# Patient Record
Sex: Male | Born: 1955 | Race: White | Hispanic: Refuse to answer | Marital: Married | Smoking: Never smoker
Health system: Southern US, Community
[De-identification: ages and names within clinical notes are randomized; demographics above are authoritative.]

## PROBLEM LIST (undated history)

## (undated) DIAGNOSIS — E785 Hyperlipidemia, unspecified: Secondary | ICD-10-CM

## (undated) DIAGNOSIS — K5790 Diverticulosis of intestine, part unspecified, without perforation or abscess without bleeding: Secondary | ICD-10-CM

## (undated) DIAGNOSIS — F528 Other sexual dysfunction not due to a substance or known physiological condition: Secondary | ICD-10-CM

## (undated) DIAGNOSIS — N529 Male erectile dysfunction, unspecified: Secondary | ICD-10-CM

## (undated) DIAGNOSIS — E119 Type 2 diabetes mellitus without complications: Secondary | ICD-10-CM

## (undated) DIAGNOSIS — I10 Essential (primary) hypertension: Secondary | ICD-10-CM

## (undated) HISTORY — DX: Essential (primary) hypertension: I10

## (undated) HISTORY — PX: COLONOSCOPY: SHX174

## (undated) HISTORY — DX: Diverticulosis of intestine, part unspecified, without perforation or abscess without bleeding: K57.90

## (undated) HISTORY — DX: Type 2 diabetes mellitus without complications: E11.9

## (undated) HISTORY — DX: Other sexual dysfunction not due to a substance or known physiological condition: F52.8

## (undated) HISTORY — DX: Male erectile dysfunction, unspecified: N52.9

## (undated) HISTORY — DX: Hyperlipidemia, unspecified: E78.5

---
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
## 2003-07-27 ENCOUNTER — Emergency Department (HOSPITAL_COMMUNITY): Admission: EM | Admit: 2003-07-27 | Discharge: 2003-07-27 | Payer: Self-pay | Admitting: Emergency Medicine

## 2004-12-07 ENCOUNTER — Ambulatory Visit: Payer: Self-pay | Admitting: Internal Medicine

## 2009-02-11 ENCOUNTER — Ambulatory Visit: Payer: Self-pay | Admitting: Internal Medicine

## 2009-02-16 LAB — CONVERTED CEMR LAB
Alkaline Phosphatase: 116 units/L (ref 39–117)
Basophils Absolute: 0 10*3/uL (ref 0.0–0.1)
Bilirubin Urine: NEGATIVE
Bilirubin, Direct: 0.2 mg/dL (ref 0.0–0.3)
Calcium: 9.1 mg/dL (ref 8.4–10.5)
Cholesterol: 247 mg/dL (ref 0–200)
Direct LDL: 169.4 mg/dL
Eosinophils Absolute: 0.1 10*3/uL (ref 0.0–0.7)
GFR calc Af Amer: 131 mL/min
GFR calc non Af Amer: 108 mL/min
HCT: 42.9 % (ref 39.0–52.0)
HDL: 49 mg/dL (ref >39.0)
Hemoglobin, Urine: NEGATIVE
Ketones, ur: NEGATIVE mg/dL
MCHC: 35.6 g/dL (ref 30.0–36.0)
Monocytes Absolute: 0.5 10*3/uL (ref 0.1–1.0)
Monocytes Relative: 8.2 % (ref 3.0–12.0)
Neutro Abs: 3.6 10*3/uL (ref 1.4–7.7)
Nitrite: NEGATIVE
PSA: 0.47 ng/mL (ref 0.10–4.00)
Platelets: 173 10*3/uL (ref 150–400)
Potassium: 4.3 meq/L (ref 3.5–5.1)
RDW: 10.8 % — ABNORMAL LOW (ref 11.5–14.6)
Sodium: 139 meq/L (ref 135–145)
TSH: 1.02 microintl units/mL (ref 0.35–5.50)
Total Bilirubin: 1 mg/dL (ref 0.3–1.2)
Total CHOL/HDL Ratio: 5
Total Protein, Urine: NEGATIVE mg/dL
Triglycerides: 162 mg/dL — ABNORMAL HIGH (ref 0–149)

## 2009-02-18 ENCOUNTER — Ambulatory Visit: Payer: Self-pay | Admitting: Internal Medicine

## 2009-02-18 DIAGNOSIS — F528 Other sexual dysfunction not due to a substance or known physiological condition: Secondary | ICD-10-CM | POA: Insufficient documentation

## 2009-02-18 DIAGNOSIS — E119 Type 2 diabetes mellitus without complications: Secondary | ICD-10-CM

## 2009-02-18 DIAGNOSIS — I10 Essential (primary) hypertension: Secondary | ICD-10-CM

## 2009-02-18 DIAGNOSIS — E785 Hyperlipidemia, unspecified: Secondary | ICD-10-CM | POA: Insufficient documentation

## 2009-02-18 DIAGNOSIS — K921 Melena: Secondary | ICD-10-CM | POA: Insufficient documentation

## 2009-02-18 HISTORY — DX: Type 2 diabetes mellitus without complications: E11.9

## 2009-02-18 HISTORY — DX: Essential (primary) hypertension: I10

## 2009-02-18 HISTORY — DX: Other sexual dysfunction not due to a substance or known physiological condition: F52.8

## 2009-02-18 HISTORY — DX: Hyperlipidemia, unspecified: E78.5

## 2009-05-27 ENCOUNTER — Ambulatory Visit: Payer: Self-pay | Admitting: Internal Medicine

## 2009-06-10 ENCOUNTER — Ambulatory Visit: Payer: Self-pay | Admitting: Internal Medicine

## 2009-06-10 HISTORY — PX: COLONOSCOPY: SHX174

## 2011-04-04 LAB — GLUCOSE, CAPILLARY: Glucose-Capillary: 116 mg/dL — ABNORMAL HIGH (ref 70–99)

## 2012-08-31 ENCOUNTER — Encounter: Payer: Self-pay | Admitting: Internal Medicine

## 2012-08-31 DIAGNOSIS — Z0000 Encounter for general adult medical examination without abnormal findings: Secondary | ICD-10-CM | POA: Insufficient documentation

## 2012-08-31 DIAGNOSIS — K5790 Diverticulosis of intestine, part unspecified, without perforation or abscess without bleeding: Secondary | ICD-10-CM | POA: Insufficient documentation

## 2012-08-31 DIAGNOSIS — Z0001 Encounter for general adult medical examination with abnormal findings: Secondary | ICD-10-CM | POA: Insufficient documentation

## 2012-08-31 HISTORY — DX: Diverticulosis of intestine, part unspecified, without perforation or abscess without bleeding: K57.90

## 2012-09-07 ENCOUNTER — Ambulatory Visit (INDEPENDENT_AMBULATORY_CARE_PROVIDER_SITE_OTHER): Payer: Federal, State, Local not specified - PPO | Admitting: Internal Medicine

## 2012-09-07 ENCOUNTER — Encounter: Payer: Self-pay | Admitting: Internal Medicine

## 2012-09-07 VITALS — BP 180/110 | HR 101 | Temp 98.4°F | Ht 72.0 in | Wt 181.1 lb

## 2012-09-07 DIAGNOSIS — R0683 Snoring: Secondary | ICD-10-CM

## 2012-09-07 DIAGNOSIS — N529 Male erectile dysfunction, unspecified: Secondary | ICD-10-CM

## 2012-09-07 DIAGNOSIS — Z23 Encounter for immunization: Secondary | ICD-10-CM

## 2012-09-07 DIAGNOSIS — R0609 Other forms of dyspnea: Secondary | ICD-10-CM

## 2012-09-07 DIAGNOSIS — Z0000 Encounter for general adult medical examination without abnormal findings: Secondary | ICD-10-CM

## 2012-09-07 DIAGNOSIS — R9431 Abnormal electrocardiogram [ECG] [EKG]: Secondary | ICD-10-CM

## 2012-09-07 DIAGNOSIS — E119 Type 2 diabetes mellitus without complications: Secondary | ICD-10-CM

## 2012-09-07 DIAGNOSIS — I10 Essential (primary) hypertension: Secondary | ICD-10-CM

## 2012-09-07 HISTORY — DX: Male erectile dysfunction, unspecified: N52.9

## 2012-09-07 MED ORDER — AMLODIPINE-OLMESARTAN 5-40 MG PO TABS: 1.0000 | ORAL_TABLET | Freq: Every day | ORAL | Status: DC

## 2012-09-07 MED ORDER — TADALAFIL 20 MG PO TABS: 20.0000 mg | ORAL_TABLET | Freq: Every day | ORAL | Status: DC | PRN

## 2012-09-07 MED ORDER — LANCETS MISC: 1.0000 "application " | Freq: Every day | Status: AC

## 2012-09-07 MED ORDER — GLUCOSE BLOOD VI STRP: ORAL_STRIP | Status: AC

## 2012-09-07 NOTE — Assessment & Plan Note (Signed)

## 2012-09-07 NOTE — Patient Instructions (Addendum)
You had the flu shot today Please go to LAB in the Basement for the blood and/or urine tests to be done today, including the testosterone You will be contacted by phone if any changes need to be made immediately.  Otherwise, you will receive a letter about your results with an explanation. Take all new medications as prescribed - the azor 5/40 mg - 1 per day You will be contacted regarding the referral for: Diabetes Class, and ENT Please return in 6 mo with Lab testing done 3-5 days before

## 2012-09-07 NOTE — Progress Notes (Signed)
Subjective:    Patient ID: Ethan Boyd, male    DOB: 03-11-1956, 56 y.o.   MRN: [id number]  HPI  Here for wellness and f/u;  Overall doing ok;  Pt denies CP, worsening SOB, DOE, wheezing, orthopnea, PND, worsening LE edema, palpitations, dizziness or syncope.  Pt denies neurological change such as new Headache, facial or extremity weakness.  Pt denies polydipsia, polyuria, or low sugar symptoms. Pt states overall good compliance with treatment and medications, good tolerability, and trying to follow lower cholesterol diet.  Pt denies worsening depressive symptoms, suicidal ideation or panic. No fever, wt loss, night sweats, loss of appetite, or other constitutional symptoms.  Pt states good ability with ADL's, low fall risk, home safety reviewed and adequate, no significant changes in hearing or vision, and occasionally active with exercise.  Has freq snoring, ? Stopped breathing at night per wife (she isnt here), but has hx of broken nose, no hypersomnolence, asks for ENT evaluation.  Does have sense of ongoing fatigue, but denies signficant hypersomnolence, asks for testosterone level.  Wt is overall stable, no longer taking the actos as he had several yrs ago.   Has ongoing ED symptoms, ? Worse in the past 6 mo. Past Medical History  Diagnosis Date  . Diverticulosis 08/31/2012    Colonoscopy, June 10, 2009, Dr Perry/GI  . DIABETES MELLITUS, TYPE II 02/18/2009    Qualifier: Diagnosis of  By: Jonny Ruiz MD, Len Blalock   . HYPERLIPIDEMIA 02/18/2009    Qualifier: Diagnosis of  By: Jonny Ruiz MD, Len Blalock   . HYPERTENSION 02/18/2009    Qualifier: Diagnosis of  By: Jonny Ruiz MD, Len Blalock   . ERECTILE DYSFUNCTION 02/18/2009    Qualifier: Diagnosis of  By: Jonny Ruiz MD, Len Blalock   . Erectile dysfunction 09/07/2012   Past Surgical History  Procedure Date  . Right knee cartilage 1976    reports that he has never smoked. He has never used smokeless tobacco. He reports that he drinks alcohol. He reports that he does not use illicit  drugs. family history includes Diabetes in an unspecified family member. Allergies  Allergen Reactions  . Aspirin     REACTION: angioedema   Current Outpatient Prescriptions on File Prior to Visit  Medication Sig Dispense Refill  . amLODipine-olmesartan (AZOR) 5-40 MG per tablet Take 1 tablet by mouth daily.  90 tablet  3  . tadalafil (CIALIS) 20 MG tablet Take 1 tablet (20 mg total) by mouth daily as needed for erectile dysfunction.  10 tablet  11    Review of Systems Review of Systems  Constitutional: Negative for diaphoresis, activity change, appetite change and unexpected weight change.  HENT: Negative for hearing loss, ear pain, facial swelling, mouth sores and neck stiffness.   Eyes: Negative for pain, redness and visual disturbance.  Respiratory: Negative for shortness of breath and wheezing.   Cardiovascular: Negative for chest pain and palpitations.  Gastrointestinal: Negative for diarrhea, blood in stool, abdominal distention and rectal pain.  Genitourinary: Negative for hematuria, flank pain and decreased urine volume.  Musculoskeletal: Negative for myalgias and joint swelling.  Skin: Negative for color change and wound.  Neurological: Negative for syncope and numbness.  Hematological: Negative for adenopathy.  Psychiatric/Behavioral: Negative for hallucinations, self-injury, decreased concentration and agitation.      Objective:   Physical Exam BP 180/110  Pulse 101  Temp 98.4 F (36.9 C) (Oral)  Ht 6' (1.829 m)  Wt 181 lb 2 oz (82.158 kg)  BMI 24.57 kg/m2  SpO2  97% Physical Exam  VS noted Constitutional: Pt is oriented to person, place, and time. Appears well-developed and well-nourished.  HENT:  Head: Normocephalic and atraumatic.  Right Ear: External ear normal.  Left Ear: External ear normal.  Nose: Nose normal.  Mouth/Throat: Oropharynx is clear and moist.  Eyes: Conjunctivae and EOM are normal. Pupils are equal, round, and reactive to light.  Neck:  Normal range of motion. Neck supple. No JVD present. No tracheal deviation present.  Cardiovascular: Normal rate, regular rhythm, normal heart sounds and intact distal pulses.   Pulmonary/Chest: Effort normal and breath sounds normal.  Abdominal: Soft. Bowel sounds are normal. There is no tenderness.  Musculoskeletal: Normal range of motion. Exhibits no edema.  Lymphadenopathy:  Has no cervical adenopathy.  Neurological: Pt is alert and oriented to person, place, and time. Pt has normal reflexes. No cranial nerve deficit.  Skin: Skin is warm and dry. No rash noted.  Psychiatric:  Has  normal mood and affect. Behavior is normal.     Assessment & Plan:

## 2012-09-07 NOTE — Assessment & Plan Note (Signed)
Not c/w sleep apnea - for ENT for possible nasal septal problem

## 2012-09-07 NOTE — Assessment & Plan Note (Addendum)
C/w LVH likely due to HTN, d/w pt who is reluctant to take BP med

## 2012-09-08 ENCOUNTER — Encounter: Payer: Self-pay | Admitting: Internal Medicine

## 2012-09-08 NOTE — Assessment & Plan Note (Addendum)
Severe uncontrolled by hx and exam, most recent data reviewed with pt, and pt to continue medical treatment as before, ECG reviewed as per emr - sinus with LVH Lab Results  Component Value Date   WBC 5.7 02/11/2009   HGB 15.3 02/11/2009   HCT 42.9 02/11/2009   PLT 173 02/11/2009   GLUCOSE 250* 02/11/2009   CHOL 247* 02/11/2009   TRIG 162* 02/11/2009   HDL 49.0 02/11/2009   LDLDIRECT 169.4 02/11/2009   ALT 32 02/11/2009   AST 21 02/11/2009   NA 139 02/11/2009   K 4.3 02/11/2009   CL 98 02/11/2009   CREATININE 0.8 02/11/2009   BUN 17 02/11/2009   CO2 31 02/11/2009   TSH 1.02 02/11/2009   PSA 0.47 02/11/2009   HGBA1C 9.1* 02/11/2009   BP Readings from Last 3 Encounters:  09/07/12 180/110  02/18/09 196/110

## 2012-09-08 NOTE — Assessment & Plan Note (Signed)
Ok for testosterone check, and PDE5 prn

## 2012-09-08 NOTE — Assessment & Plan Note (Signed)
?   Control, for a1c, consider OHA for a1c > 7

## 2012-09-10 ENCOUNTER — Other Ambulatory Visit (INDEPENDENT_AMBULATORY_CARE_PROVIDER_SITE_OTHER): Payer: Federal, State, Local not specified - PPO

## 2012-09-10 DIAGNOSIS — E119 Type 2 diabetes mellitus without complications: Secondary | ICD-10-CM

## 2012-09-10 DIAGNOSIS — N529 Male erectile dysfunction, unspecified: Secondary | ICD-10-CM

## 2012-09-10 DIAGNOSIS — Z0000 Encounter for general adult medical examination without abnormal findings: Secondary | ICD-10-CM

## 2012-09-10 LAB — URINALYSIS, ROUTINE W REFLEX MICROSCOPIC
Bilirubin Urine: NEGATIVE
Nitrite: NEGATIVE
Total Protein, Urine: NEGATIVE
pH: 6 (ref 5.0–8.0)

## 2012-09-10 LAB — CBC WITH DIFFERENTIAL/PLATELET
Eosinophils Relative: 3 % (ref 0.0–5.0)
Lymphocytes Relative: 23.1 % (ref 12.0–46.0)
MCV: 93.5 fl (ref 78.0–100.0)
Monocytes Absolute: 0.7 10*3/uL (ref 0.1–1.0)
Monocytes Relative: 9.9 % (ref 3.0–12.0)
Neutrophils Relative %: 63.6 % (ref 43.0–77.0)
Platelets: 227 10*3/uL (ref 150.0–400.0)
WBC: 6.8 10*3/uL (ref 4.5–10.5)

## 2012-09-10 LAB — LIPID PANEL
Cholesterol: 203 mg/dL — ABNORMAL HIGH (ref 0–200)
HDL: 57.6 mg/dL (ref >39.00)
Triglycerides: 74 mg/dL (ref 0.0–149.0)
VLDL: 14.8 mg/dL (ref 0.0–40.0)

## 2012-09-10 LAB — HEPATIC FUNCTION PANEL
ALT: 52 U/L (ref 0–53)
Bilirubin, Direct: 0.2 mg/dL (ref 0.0–0.3)
Total Bilirubin: 0.9 mg/dL (ref 0.3–1.2)

## 2012-09-10 LAB — BASIC METABOLIC PANEL
BUN: 13 mg/dL (ref 6–23)
Calcium: 9.2 mg/dL (ref 8.4–10.5)
Creatinine, Ser: 0.9 mg/dL (ref 0.4–1.5)
GFR: 89.37 mL/min (ref >60.00)

## 2012-09-11 LAB — TESTOSTERONE, FREE, TOTAL, SHBG
Sex Hormone Binding: 32 nmol/L (ref 13–71)
Testosterone, Free: 56.8 pg/mL (ref 47.0–244.0)

## 2012-09-12 ENCOUNTER — Other Ambulatory Visit: Payer: Self-pay | Admitting: Internal Medicine

## 2012-09-12 ENCOUNTER — Encounter: Payer: Self-pay | Admitting: Internal Medicine

## 2012-09-12 MED ORDER — ATORVASTATIN CALCIUM 20 MG PO TABS: 20.0000 mg | ORAL_TABLET | Freq: Every day | ORAL | Status: DC

## 2012-09-12 MED ORDER — METFORMIN HCL 500 MG PO TABS: 500.0000 mg | ORAL_TABLET | Freq: Every day | ORAL | Status: DC

## 2013-03-11 ENCOUNTER — Ambulatory Visit: Payer: Federal, State, Local not specified - PPO | Admitting: Internal Medicine

## 2013-09-29 ENCOUNTER — Other Ambulatory Visit: Payer: Self-pay | Admitting: Internal Medicine

## 2013-12-17 ENCOUNTER — Telehealth: Payer: Self-pay | Admitting: *Deleted

## 2013-12-17 NOTE — Telephone Encounter (Signed)
rx not normally given prophylactically unless in the Nursing Home.  Ok to follow for now;  tamflu would be important in the first 48 hrs of onset of flu like symptoms though, so please let us know

## 2013-12-17 NOTE — Telephone Encounter (Signed)
Pt called states his mother in law was just diagnosed with the Flu approx. 2 hours ago.  Pt further states he has not had his Flu shot this year.  Pt is requesting a Rx of Tamiflu.  Please advise

## 2013-12-17 NOTE — Telephone Encounter (Signed)
Patient informed of MD instructions. 

## 2014-03-31 ENCOUNTER — Other Ambulatory Visit: Payer: Self-pay | Admitting: Internal Medicine

## 2014-05-09 ENCOUNTER — Encounter: Payer: Self-pay | Admitting: Internal Medicine

## 2014-05-09 ENCOUNTER — Ambulatory Visit (INDEPENDENT_AMBULATORY_CARE_PROVIDER_SITE_OTHER): Payer: Federal, State, Local not specified - PPO | Admitting: Internal Medicine

## 2014-05-09 VITALS — BP 140/84 | HR 105 | Temp 97.5°F | Ht 72.0 in | Wt 181.0 lb

## 2014-05-09 DIAGNOSIS — I10 Essential (primary) hypertension: Secondary | ICD-10-CM

## 2014-05-09 DIAGNOSIS — Z0000 Encounter for general adult medical examination without abnormal findings: Secondary | ICD-10-CM

## 2014-05-09 DIAGNOSIS — E119 Type 2 diabetes mellitus without complications: Secondary | ICD-10-CM

## 2014-05-09 MED ORDER — AMLODIPINE-OLMESARTAN 10-40 MG PO TABS: 1.0000 | ORAL_TABLET | Freq: Every day | ORAL | Status: DC

## 2014-05-09 NOTE — Assessment & Plan Note (Signed)
stable overall by history and exam, recent data reviewed with pt, and pt to continue medical treatment as before,  to f/u any worsening symptoms or concerns Lab Results  Component Value Date   HGBA1C 7.9* 09/10/2012   For f/u labs

## 2014-05-09 NOTE — Progress Notes (Signed)
Subjective:    Patient ID: Ethan Boyd, male    DOB: Apr 17, 1956, 58 y.o.   MRN: [id number]  HPI  Here for wellness and f/u;  Overall doing ok;  Pt denies CP, worsening SOB, DOE, wheezing, orthopnea, PND, worsening LE edema, palpitations, dizziness or syncope.  Pt denies neurological change such as new headache, facial or extremity weakness.  Pt denies polydipsia, polyuria, or low sugar symptoms. Pt states overall good compliance with treatment and medications, good tolerability, and has been trying to follow lower cholesterol diet.  Pt denies worsening depressive symptoms, suicidal ideation or panic. No fever, night sweats, wt loss, loss of appetite, or other constitutional symptoms.  Pt states good ability with ADL's, has low fall risk, home safety reviewed and adequate, no other significant changes in hearing or vision, and only occasionally active with exercise.  Curtrently on azor, Bp in the 130's usually. Past Medical History  Diagnosis Date  . Diverticulosis 08/31/2012    Colonoscopy, June 10, 2009, Dr Perry/GI  . DIABETES MELLITUS, TYPE II 02/18/2009    Qualifier: Diagnosis of  By: Jonny RuizJohn MD, Len BlalockJames W   . HYPERLIPIDEMIA 02/18/2009    Qualifier: Diagnosis of  By: Jonny RuizJohn MD, Len BlalockJames W   . HYPERTENSION 02/18/2009    Qualifier: Diagnosis of  By: Jonny RuizJohn MD, Len BlalockJames W   . ERECTILE DYSFUNCTION 02/18/2009    Qualifier: Diagnosis of  By: Jonny RuizJohn MD, Len BlalockJames W   . Erectile dysfunction 09/07/2012   Past Surgical History  Procedure Laterality Date  . Right knee cartilage  1976    reports that he has never smoked. He has never used smokeless tobacco. He reports that he drinks alcohol. He reports that he does not use illicit drugs. family history includes Diabetes in an other family member. Allergies  Allergen Reactions  . Aspirin     REACTION: angioedema   Current Outpatient Prescriptions on File Prior to Visit  Medication Sig Dispense Refill  . Lancets MISC 1 application by Does not apply route daily.  100  each  11  . tadalafil (CIALIS) 20 MG tablet Take 1 tablet (20 mg total) by mouth daily as needed for erectile dysfunction.  10 tablet  11   No current facility-administered medications on file prior to visit.   Review of Systems Constitutional: Negative for increased diaphoresis, other activity, appetite or other siginficant weight change  HENT: Negative for worsening hearing loss, ear pain, facial swelling, mouth sores and neck stiffness.   Eyes: Negative for other worsening pain, redness or visual disturbance.  Respiratory: Negative for shortness of breath and wheezing.   Cardiovascular: Negative for chest pain and palpitations.  Gastrointestinal: Negative for diarrhea, blood in stool, abdominal distention or other pain Genitourinary: Negative for hematuria, flank pain or change in urine volume.  Musculoskeletal: Negative for myalgias or other joint complaints.  Skin: Negative for color change and wound.  Neurological: Negative for syncope and numbness. other than noted Hematological: Negative for adenopathy. or other swelling Psychiatric/Behavioral: Negative for hallucinations, self-injury, decreased concentration or other worsening agitation.      Objective:   Physical Exam BP 140/84  Pulse 105  Temp(Src) 97.5 F (36.4 C) (Oral)  Ht 6' (1.829 m)  Wt 181 lb (82.101 kg)  BMI 24.54 kg/m2  SpO2 97% VS noted,  Constitutional: Pt is oriented to person, place, and time. Appears well-developed and well-nourished.  Head: Normocephalic and atraumatic.  Right Ear: External ear normal.  Left Ear: External ear normal.  Nose: Nose normal.  Mouth/Throat:  Oropharynx is clear and moist.  Eyes: Conjunctivae and EOM are normal. Pupils are equal, round, and reactive to light.  Neck: Normal range of motion. Neck supple. No JVD present. No tracheal deviation present.  Cardiovascular: Normal rate, regular rhythm, normal heart sounds and intact distal pulses.   Pulmonary/Chest: Effort normal and  breath sounds without rales or wheezing  Abdominal: Soft. Bowel sounds are normal. NT. No HSM  Musculoskeletal: Normal range of motion. Exhibits no edema.  Lymphadenopathy:  Has no cervical adenopathy.  Neurological: Pt is alert and oriented to person, place, and time. Pt has normal reflexes. No cranial nerve deficit. Motor grossly intact Skin: Skin is warm and dry. No rash noted.  Psychiatric:  Has normal mood and affect. Behavior is normal.      Assessment & Plan:

## 2014-05-09 NOTE — Patient Instructions (Addendum)
OK to increase the azor to the 10/40 mg  Per day  Please continue all other medications as before, and refills have been done if requested. Please have the pharmacy call with any other refills you may need.  Please continue your efforts at being more active, low cholesterol diet, and weight control.  You are otherwise up to date with prevention measures today.  Please keep your appointments with your specialists as you may have planned  Please go to the LAB in the Basement (turn left off the elevator) for the tests to be done today  You will be contacted by phone if any changes need to be made immediately.  Otherwise, you will receive a letter about your results with an explanation, but please check with MyChart first.  Please return in 6 months, or sooner if needed, with Lab testing done 3-5 days before\

## 2014-05-09 NOTE — Progress Notes (Signed)
Pre visit review using our clinic review tool, if applicable. No additional management support is needed unless otherwise documented below in the visit note. 

## 2014-05-09 NOTE — Assessment & Plan Note (Signed)

## 2014-05-09 NOTE — Assessment & Plan Note (Signed)
To incr the azor to 10/40

## 2014-05-12 ENCOUNTER — Telehealth: Payer: Self-pay | Admitting: Internal Medicine

## 2014-05-12 NOTE — Telephone Encounter (Signed)
Relevant patient education mailed to patient.  

## 2014-06-06 ENCOUNTER — Encounter: Payer: Self-pay | Admitting: Internal Medicine

## 2014-06-06 ENCOUNTER — Other Ambulatory Visit (INDEPENDENT_AMBULATORY_CARE_PROVIDER_SITE_OTHER): Payer: Federal, State, Local not specified - PPO

## 2014-06-06 ENCOUNTER — Other Ambulatory Visit: Payer: Self-pay | Admitting: Internal Medicine

## 2014-06-06 DIAGNOSIS — E119 Type 2 diabetes mellitus without complications: Secondary | ICD-10-CM

## 2014-06-06 DIAGNOSIS — Z0000 Encounter for general adult medical examination without abnormal findings: Secondary | ICD-10-CM

## 2014-06-06 LAB — CBC WITH DIFFERENTIAL/PLATELET
Basophils Absolute: 0 10*3/uL (ref 0.0–0.1)
Basophils Relative: 0.3 % (ref 0.0–3.0)
EOS ABS: 0.2 10*3/uL (ref 0.0–0.7)
Eosinophils Relative: 3.5 % (ref 0.0–5.0)
HCT: 41.5 % (ref 39.0–52.0)
HEMOGLOBIN: 14.3 g/dL (ref 13.0–17.0)
LYMPHS PCT: 27.4 % (ref 12.0–46.0)
Lymphs Abs: 1.8 10*3/uL (ref 0.7–4.0)
MCHC: 34.4 g/dL (ref 30.0–36.0)
MCV: 91.2 fl (ref 78.0–100.0)
MONOS PCT: 11 % (ref 3.0–12.0)
Monocytes Absolute: 0.7 10*3/uL (ref 0.1–1.0)
NEUTROS ABS: 3.9 10*3/uL (ref 1.4–7.7)
NEUTROS PCT: 57.8 % (ref 43.0–77.0)
Platelets: 223 10*3/uL (ref 150.0–400.0)
RBC: 4.55 Mil/uL (ref 4.22–5.81)
RDW: 12.2 % (ref 11.5–15.5)
WBC: 6.8 10*3/uL (ref 4.0–10.5)

## 2014-06-06 LAB — URINALYSIS, ROUTINE W REFLEX MICROSCOPIC
BILIRUBIN URINE: NEGATIVE
Hgb urine dipstick: NEGATIVE
Ketones, ur: NEGATIVE
LEUKOCYTES UA: NEGATIVE
NITRITE: NEGATIVE
Specific Gravity, Urine: 1.02 (ref 1.000–1.030)
TOTAL PROTEIN, URINE-UPE24: NEGATIVE
Urine Glucose: 100 — AB
Urobilinogen, UA: 0.2 (ref 0.0–1.0)
pH: 6 (ref 5.0–8.0)

## 2014-06-06 LAB — HEPATIC FUNCTION PANEL
ALBUMIN: 4.3 g/dL (ref 3.5–5.2)
ALT: 33 U/L (ref 0–53)
AST: 28 U/L (ref 0–37)
Alkaline Phosphatase: 82 U/L (ref 39–117)
Bilirubin, Direct: 0.1 mg/dL (ref 0.0–0.3)
TOTAL PROTEIN: 7.8 g/dL (ref 6.0–8.3)
Total Bilirubin: 0.7 mg/dL (ref 0.2–1.2)

## 2014-06-06 LAB — BASIC METABOLIC PANEL
BUN: 15 mg/dL (ref 6–23)
CHLORIDE: 101 meq/L (ref 96–112)
CO2: 27 meq/L (ref 19–32)
CREATININE: 0.9 mg/dL (ref 0.4–1.5)
Calcium: 9.3 mg/dL (ref 8.4–10.5)
GFR: 91.07 mL/min (ref >60.00)
GLUCOSE: 234 mg/dL — AB (ref 70–99)
Potassium: 4.3 mEq/L (ref 3.5–5.1)
Sodium: 138 mEq/L (ref 135–145)

## 2014-06-06 LAB — MICROALBUMIN / CREATININE URINE RATIO
Creatinine,U: 121.4 mg/dL
MICROALB/CREAT RATIO: 5.5 mg/g (ref 0.0–30.0)
Microalb, Ur: 6.7 mg/dL — ABNORMAL HIGH (ref 0.0–1.9)

## 2014-06-06 LAB — TSH: TSH: 1.47 u[IU]/mL (ref 0.35–4.50)

## 2014-06-06 LAB — LIPID PANEL
CHOLESTEROL: 233 mg/dL — AB (ref 0–200)
HDL: 71.9 mg/dL (ref >39.00)
LDL Cholesterol: 145 mg/dL — ABNORMAL HIGH (ref 0–99)
NonHDL: 161.1
TRIGLYCERIDES: 81 mg/dL (ref 0.0–149.0)
Total CHOL/HDL Ratio: 3
VLDL: 16.2 mg/dL (ref 0.0–40.0)

## 2014-06-06 LAB — PSA: PSA: 0.94 ng/mL (ref 0.10–4.00)

## 2014-06-06 LAB — HEMOGLOBIN A1C: Hgb A1c MFr Bld: 8.1 % — ABNORMAL HIGH (ref 4.6–6.5)

## 2014-06-06 MED ORDER — METFORMIN HCL ER 500 MG PO TB24: 500.0000 mg | ORAL_TABLET | Freq: Every day | ORAL | Status: DC

## 2014-06-06 MED ORDER — ATORVASTATIN CALCIUM 10 MG PO TABS: 10.0000 mg | ORAL_TABLET | Freq: Every day | ORAL | Status: DC

## 2014-06-26 ENCOUNTER — Telehealth: Payer: Self-pay | Admitting: *Deleted

## 2014-06-26 NOTE — Telephone Encounter (Signed)
Left message on machine for patient to call for a follow up DM visit. Labs already ordered.

## 2014-08-05 ENCOUNTER — Telehealth: Payer: Self-pay | Admitting: Internal Medicine

## 2014-08-05 NOTE — Telephone Encounter (Signed)
Left vm for patient to call back to schedule CPE.  Last CPE was 05/09/2014.

## 2015-05-15 ENCOUNTER — Other Ambulatory Visit: Payer: Self-pay | Admitting: Internal Medicine

## 2015-06-14 ENCOUNTER — Other Ambulatory Visit: Payer: Self-pay | Admitting: Internal Medicine

## 2015-07-13 ENCOUNTER — Other Ambulatory Visit: Payer: Self-pay | Admitting: Internal Medicine

## 2015-07-14 NOTE — Telephone Encounter (Signed)
In need of follow up appt. Thanks

## 2015-08-10 ENCOUNTER — Other Ambulatory Visit: Payer: Self-pay | Admitting: Internal Medicine

## 2015-11-12 ENCOUNTER — Encounter: Payer: Self-pay | Admitting: Internal Medicine

## 2016-02-05 ENCOUNTER — Telehealth: Payer: Self-pay | Admitting: *Deleted

## 2016-02-05 MED ORDER — AMLODIPINE-OLMESARTAN 10-40 MG PO TABS: 1.0000 | ORAL_TABLET | Freq: Every day | ORAL | Status: DC

## 2016-02-05 NOTE — Telephone Encounter (Signed)
Left msg on triage need refill on his Azor he has made appt for 03/16/16, but will be out of meds by then...Raechel Chute

## 2016-03-06 ENCOUNTER — Other Ambulatory Visit: Payer: Self-pay | Admitting: Internal Medicine

## 2016-03-16 ENCOUNTER — Telehealth: Payer: Self-pay

## 2016-03-16 ENCOUNTER — Encounter: Payer: Self-pay | Admitting: Internal Medicine

## 2016-03-16 ENCOUNTER — Ambulatory Visit (INDEPENDENT_AMBULATORY_CARE_PROVIDER_SITE_OTHER): Payer: Federal, State, Local not specified - PPO | Admitting: Internal Medicine

## 2016-03-16 VITALS — BP 140/82 | HR 113 | Temp 98.2°F | Resp 20 | Wt 180.0 lb

## 2016-03-16 DIAGNOSIS — I10 Essential (primary) hypertension: Secondary | ICD-10-CM | POA: Diagnosis not present

## 2016-03-16 DIAGNOSIS — Z0000 Encounter for general adult medical examination without abnormal findings: Secondary | ICD-10-CM | POA: Diagnosis not present

## 2016-03-16 DIAGNOSIS — E785 Hyperlipidemia, unspecified: Secondary | ICD-10-CM

## 2016-03-16 DIAGNOSIS — E119 Type 2 diabetes mellitus without complications: Secondary | ICD-10-CM

## 2016-03-16 DIAGNOSIS — Z1159 Encounter for screening for other viral diseases: Secondary | ICD-10-CM | POA: Diagnosis not present

## 2016-03-16 NOTE — Progress Notes (Signed)
Pre visit review using our clinic review tool, if applicable. No additional management support is needed unless otherwise documented below in the visit note. 

## 2016-03-16 NOTE — Assessment & Plan Note (Addendum)

## 2016-03-16 NOTE — Assessment & Plan Note (Addendum)
stable overall by history and exam, recent data reviewed with pt, and pt to continue medical treatment as before,  to f/u any worsening symptoms or concerns Lab Results  Component Value Date   LDLCALC 145* 06/06/2014   Is not taking the statin, for f/u lab , has been taking otc kale, but suspect will need statin if he accepts, goal ldl < 70

## 2016-03-16 NOTE — Assessment & Plan Note (Addendum)
Pt vague on taking the metformin daly, o/w stable overall by history and exam, recent data reviewed with pt, and pt to continue medical treatment as before,  to f/u any worsening symptoms or concerns Lab Results  Component Value Date   HGBA1C 8.1* 06/06/2014   For fu today, then repeat 6 mo

## 2016-03-16 NOTE — Telephone Encounter (Signed)
A user error has taken place.

## 2016-03-16 NOTE — Assessment & Plan Note (Signed)
stable overall by history and exam, recent data reviewed with pt, and pt to continue medical treatment as before,  to f/u any worsening symptoms or concerns BP Readings from Last 3 Encounters:  03/16/16 140/82  05/09/14 140/84  09/07/12 180/110

## 2016-03-16 NOTE — Patient Instructions (Signed)
Please continue all other medications as before, and refills have been done if requested.  Please have the pharmacy call with any other refills you may need.  Please continue your efforts at being more active, low cholesterol diet, and weight control.  You are otherwise up to date with prevention measures today.  Please keep your appointments with your specialists as you may have planned  Please make a Nurse Visit appointment if you change your mind about the Prevnar 13 shot, at any time  Please go to the LAB in the Basement (turn left off the elevator) for the tests to be done today  You will be contacted by phone if any changes need to be made immediately.  Otherwise, you will receive a letter about your results with an explanation, but please check with MyChart first.  Please remember to sign up for MyChart if you have not done so, as this will be important to you in the future with finding out test results, communicating by private email, and scheduling acute appointments online when needed.  Please return in 6 months, or sooner if needed, with Lab testing done 3-5 days before

## 2016-03-16 NOTE — Progress Notes (Signed)
Subjective:    Patient ID: Ethan Boyd, male    DOB: [birth year], 60 y.o.   MRN: [id number]  HPI Here for wellness and f/u;  Overall doing ok;  Pt denies Chest pain, worsening SOB, DOE, wheezing, orthopnea, PND, worsening LE edema, palpitations, dizziness or syncope.  Pt denies neurological change such as new headache, facial or extremity weakness.  Pt denies polydipsia, polyuria, or low sugar symptoms. Pt states overall good compliance with treatment and medications, good tolerability, and has been trying to follow appropriate diet.  Pt denies worsening depressive symptoms, suicidal ideation or panic. No fever, night sweats, wt loss, loss of appetite, or other constitutional symptoms.  Pt states good ability with ADL's, has low fall risk, home safety reviewed and adequate, no other significant changes in hearing or vision, and only occasionally active with exercise. Declines immunizations. BP < 14090 every few wks at grocery store  Past Medical History  Diagnosis Date  . Diverticulosis 08/31/2012    Colonoscopy, June 10, 2009, Dr Perry/GI  . DIABETES MELLITUS, TYPE II 02/18/2009    Qualifier: Diagnosis of  By: Jonny RuizJohn MD, Len BlalockJames W   . HYPERLIPIDEMIA 02/18/2009    Qualifier: Diagnosis of  By: Jonny RuizJohn MD, Len BlalockJames W   . HYPERTENSION 02/18/2009    Qualifier: Diagnosis of  By: Jonny RuizJohn MD, Len BlalockJames W   . ERECTILE DYSFUNCTION 02/18/2009    Qualifier: Diagnosis of  By: Jonny RuizJohn MD, Len BlalockJames W   . Erectile dysfunction 09/07/2012   Past Surgical History  Procedure Laterality Date  . Right knee cartilage  1976    reports that he has never smoked. He has never used smokeless tobacco. He reports that he drinks alcohol. He reports that he does not use illicit drugs. family history is not on file. Allergies  Allergen Reactions  . Aspirin     REACTION: angioedema   Current Outpatient Prescriptions on File Prior to Visit  Medication Sig Dispense Refill  . amLODipine-olmesartan (AZOR) 10-40 MG tablet TAKE 1 TABLET ONCE DAILY.  30 tablet 0  . atorvastatin (LIPITOR) 10 MG tablet Take 1 tablet (10 mg total) by mouth daily. 90 tablet 3  . metFORMIN (GLUCOPHAGE-XR) 500 MG 24 hr tablet Take 1 tablet (500 mg total) by mouth daily with breakfast. 90 tablet 3   No current facility-administered medications on file prior to visit.    Review of Systems Constitutional: Negative for increased diaphoresis, other activity, appetite or siginficant weight change other than noted HENT: Negative for worsening hearing loss, ear pain, facial swelling, mouth sores and neck stiffness.   Eyes: Negative for other worsening pain, redness or visual disturbance.  Respiratory: Negative for shortness of breath and wheezing  Cardiovascular: Negative for chest pain and palpitations.  Gastrointestinal: Negative for diarrhea, blood in stool, abdominal distention or other pain Genitourinary: Negative for hematuria, flank pain or change in urine volume.  Musculoskeletal: Negative for myalgias or other joint complaints.  Skin: Negative for color change and wound or drainage.  Neurological: Negative for syncope and numbness. other than noted Hematological: Negative for adenopathy. or other swelling Psychiatric/Behavioral: Negative for hallucinations, SI, self-injury, decreased concentration or other worsening agitation.      Objective:   Physical Exam BP 140/82 mmHg  Pulse 113  Temp(Src) 98.2 F (36.8 C) (Oral)  Resp 20  Wt 180 lb (81.647 kg)  SpO2 97% VS noted,  Constitutional: Pt is oriented to person, place, and time. Appears well-developed and well-nourished, in no significant distress Head: Normocephalic and atraumatic.  Right Ear:  External ear normal.  Left Ear: External ear normal.  Nose: Nose normal.  Mouth/Throat: Oropharynx is clear and moist.  Eyes: Conjunctivae and EOM are normal. Pupils are equal, round, and reactive to light.  Neck: Normal range of motion. Neck supple. No JVD present. No tracheal deviation present or  significant neck LA or mass Cardiovascular: Normal rate, regular rhythm, normal heart sounds and intact distal pulses.   Pulmonary/Chest: Effort normal and breath sounds without rales or wheezing  Abdominal: Soft. Bowel sounds are normal. NT. No HSM  Musculoskeletal: Normal range of motion. Exhibits no edema.  Lymphadenopathy:  Has no cervical adenopathy.  Neurological: Pt is alert and oriented to person, place, and time. Pt has normal reflexes. No cranial nerve deficit. Motor grossly intact Skin: Skin is warm and dry. No rash noted.  Psychiatric:  Has normal mood and affect. Behavior is normal.     Assessment & Plan:

## 2016-03-21 ENCOUNTER — Telehealth: Payer: Self-pay | Admitting: Internal Medicine

## 2016-03-21 ENCOUNTER — Other Ambulatory Visit (INDEPENDENT_AMBULATORY_CARE_PROVIDER_SITE_OTHER): Payer: Federal, State, Local not specified - PPO

## 2016-03-21 ENCOUNTER — Other Ambulatory Visit: Payer: Self-pay | Admitting: Internal Medicine

## 2016-03-21 ENCOUNTER — Encounter: Payer: Self-pay | Admitting: Internal Medicine

## 2016-03-21 DIAGNOSIS — Z0000 Encounter for general adult medical examination without abnormal findings: Secondary | ICD-10-CM

## 2016-03-21 DIAGNOSIS — E119 Type 2 diabetes mellitus without complications: Secondary | ICD-10-CM | POA: Diagnosis not present

## 2016-03-21 DIAGNOSIS — Z1159 Encounter for screening for other viral diseases: Secondary | ICD-10-CM

## 2016-03-21 LAB — URINALYSIS, ROUTINE W REFLEX MICROSCOPIC
Hgb urine dipstick: NEGATIVE
KETONES UR: 15 — AB
LEUKOCYTES UA: NEGATIVE
Nitrite: NEGATIVE
PH: 6 (ref 5.0–8.0)
RBC / HPF: NONE SEEN (ref 0–?)
Specific Gravity, Urine: 1.025 (ref 1.000–1.030)
TOTAL PROTEIN, URINE-UPE24: 30 — AB
UROBILINOGEN UA: 0.2 (ref 0.0–1.0)
Urine Glucose: NEGATIVE

## 2016-03-21 LAB — CBC WITH DIFFERENTIAL/PLATELET
BASOS PCT: 1 % (ref 0.0–3.0)
Basophils Absolute: 0.1 10*3/uL (ref 0.0–0.1)
EOS PCT: 1.8 % (ref 0.0–5.0)
Eosinophils Absolute: 0.1 10*3/uL (ref 0.0–0.7)
HCT: 43.5 % (ref 39.0–52.0)
Hemoglobin: 14.9 g/dL (ref 13.0–17.0)
LYMPHS ABS: 1.9 10*3/uL (ref 0.7–4.0)
Lymphocytes Relative: 28.5 % (ref 12.0–46.0)
MCHC: 34.3 g/dL (ref 30.0–36.0)
MCV: 90.4 fl (ref 78.0–100.0)
MONO ABS: 0.7 10*3/uL (ref 0.1–1.0)
MONOS PCT: 10.3 % (ref 3.0–12.0)
NEUTROS PCT: 58.4 % (ref 43.0–77.0)
Neutro Abs: 3.9 10*3/uL (ref 1.4–7.7)
Platelets: 231 10*3/uL (ref 150.0–400.0)
RBC: 4.81 Mil/uL (ref 4.22–5.81)
RDW: 12 % (ref 11.5–15.5)
WBC: 6.6 10*3/uL (ref 4.0–10.5)

## 2016-03-21 LAB — BASIC METABOLIC PANEL
BUN: 14 mg/dL (ref 6–23)
CO2: 29 mEq/L (ref 19–32)
Calcium: 9.7 mg/dL (ref 8.4–10.5)
Chloride: 98 mEq/L (ref 96–112)
Creatinine, Ser: 1.04 mg/dL (ref 0.40–1.50)
GFR: 77.58 mL/min (ref >60.00)
Glucose, Bld: 200 mg/dL — ABNORMAL HIGH (ref 70–99)
POTASSIUM: 4.8 meq/L (ref 3.5–5.1)
SODIUM: 137 meq/L (ref 135–145)

## 2016-03-21 LAB — MICROALBUMIN / CREATININE URINE RATIO
CREATININE, U: 274 mg/dL
MICROALB UR: 27.1 mg/dL — AB (ref 0.0–1.9)
Microalb Creat Ratio: 9.9 mg/g (ref 0.0–30.0)

## 2016-03-21 LAB — HEPATIC FUNCTION PANEL
ALK PHOS: 91 U/L (ref 39–117)
ALT: 35 U/L (ref 0–53)
AST: 27 U/L (ref 0–37)
Albumin: 4.6 g/dL (ref 3.5–5.2)
BILIRUBIN DIRECT: 0.1 mg/dL (ref 0.0–0.3)
BILIRUBIN TOTAL: 0.6 mg/dL (ref 0.2–1.2)
Total Protein: 7.5 g/dL (ref 6.0–8.3)

## 2016-03-21 LAB — LIPID PANEL
CHOL/HDL RATIO: 3
CHOLESTEROL: 245 mg/dL — AB (ref 0–200)
HDL: 72.7 mg/dL (ref >39.00)
LDL CALC: 147 mg/dL — AB (ref 0–99)
NONHDL: 171.81
Triglycerides: 125 mg/dL (ref 0.0–149.0)
VLDL: 25 mg/dL (ref 0.0–40.0)

## 2016-03-21 LAB — TSH: TSH: 1.36 u[IU]/mL (ref 0.35–4.50)

## 2016-03-21 LAB — HEMOGLOBIN A1C: Hgb A1c MFr Bld: 9.1 % — ABNORMAL HIGH (ref 4.6–6.5)

## 2016-03-21 LAB — HEPATITIS C ANTIBODY: HCV AB: NEGATIVE

## 2016-03-21 LAB — PSA: PSA: 0.88 ng/mL (ref 0.10–4.00)

## 2016-03-21 MED ORDER — METFORMIN HCL ER 500 MG PO TB24: ORAL_TABLET | ORAL | Status: DC

## 2016-03-21 MED ORDER — ROSUVASTATIN CALCIUM 20 MG PO TABS: 20.0000 mg | ORAL_TABLET | Freq: Every day | ORAL | Status: DC

## 2016-03-21 MED ORDER — GLIPIZIDE ER 5 MG PO TB24: 5.0000 mg | ORAL_TABLET | Freq: Every day | ORAL | Status: DC

## 2016-03-21 NOTE — Telephone Encounter (Signed)
Pt returned your call. Please call him at [phone]

## 2016-04-08 ENCOUNTER — Other Ambulatory Visit: Payer: Self-pay | Admitting: Internal Medicine

## 2016-05-09 ENCOUNTER — Other Ambulatory Visit: Payer: Self-pay | Admitting: Internal Medicine

## 2016-06-10 ENCOUNTER — Other Ambulatory Visit: Payer: Self-pay | Admitting: Internal Medicine

## 2016-07-08 ENCOUNTER — Other Ambulatory Visit: Payer: Self-pay | Admitting: Internal Medicine

## 2016-08-07 ENCOUNTER — Other Ambulatory Visit: Payer: Self-pay | Admitting: Internal Medicine

## 2016-10-01 DIAGNOSIS — Z23 Encounter for immunization: Secondary | ICD-10-CM | POA: Diagnosis not present

## 2017-02-05 ENCOUNTER — Other Ambulatory Visit: Payer: Self-pay | Admitting: Internal Medicine

## 2017-02-21 ENCOUNTER — Other Ambulatory Visit: Payer: Self-pay | Admitting: Internal Medicine

## 2017-04-09 ENCOUNTER — Other Ambulatory Visit: Payer: Self-pay | Admitting: Internal Medicine

## 2017-06-10 ENCOUNTER — Telehealth: Payer: Self-pay | Admitting: Internal Medicine

## 2017-06-20 MED ORDER — AMLODIPINE-OLMESARTAN 10-40 MG PO TABS
1.0000 | ORAL_TABLET | Freq: Every day | ORAL | 1 refills | Status: DC
Start: 2017-06-20 — End: 2017-08-15

## 2017-06-20 NOTE — Telephone Encounter (Signed)
Patient has an appointment set up for August 8th. Dr. Jonny RuizJohn did not have a CPE spot any sooner that worked for the patient. Can we send in refills until his appointment. Thank you.   [facility] is what he uses.

## 2017-06-20 NOTE — Addendum Note (Signed)
Addended by: Roney MansGAY, SHIRRON on: 06/20/2017 11:07 AM   Modules accepted: Orders

## 2017-06-20 NOTE — Telephone Encounter (Signed)
Done

## 2017-08-02 ENCOUNTER — Encounter: Payer: Federal, State, Local not specified - PPO | Admitting: Internal Medicine

## 2017-08-12 ENCOUNTER — Other Ambulatory Visit: Payer: Self-pay | Admitting: Internal Medicine

## 2017-08-15 ENCOUNTER — Telehealth: Payer: Self-pay | Admitting: Internal Medicine

## 2017-08-15 MED ORDER — ROSUVASTATIN CALCIUM 20 MG PO TABS: 20.0000 mg | ORAL_TABLET | Freq: Every day | ORAL | 1 refills | Status: DC

## 2017-08-15 MED ORDER — GLIPIZIDE ER 5 MG PO TB24: 5.0000 mg | ORAL_TABLET | Freq: Every day | ORAL | 1 refills | Status: DC

## 2017-08-15 MED ORDER — METFORMIN HCL ER 500 MG PO TB24: ORAL_TABLET | ORAL | 1 refills | Status: DC

## 2017-08-15 MED ORDER — AMLODIPINE-OLMESARTAN 10-40 MG PO TABS: 1.0000 | ORAL_TABLET | Freq: Every day | ORAL | 1 refills | Status: DC

## 2017-08-15 NOTE — Telephone Encounter (Signed)
Pt called stating that he does have an appointment schedule on October 10th. Can this be refilled?

## 2017-08-15 NOTE — Telephone Encounter (Signed)
Per office policy sent 30 day to local pharmacy until appt.../lmb  

## 2017-08-15 NOTE — Addendum Note (Signed)
Addended by: Deatra James on: 08/15/2017 04:21 PM   Modules accepted: Orders

## 2017-10-04 ENCOUNTER — Encounter: Payer: Federal, State, Local not specified - PPO | Admitting: Internal Medicine

## 2017-10-30 ENCOUNTER — Telehealth: Payer: Self-pay | Admitting: Internal Medicine

## 2017-10-30 MED ORDER — AMLODIPINE-OLMESARTAN 10-40 MG PO TABS: 1.0000 | ORAL_TABLET | Freq: Every day | ORAL | 1 refills | Status: DC

## 2017-10-30 NOTE — Telephone Encounter (Signed)
amLODipine-olmesartan (AZOR) 10-40 MG tablet   Patient is requesting a refill on this medication. He has a CPE set up for 12/20.

## 2017-10-30 NOTE — Telephone Encounter (Signed)
Per office policy sent enough refills to local pharmacy until appt.../lmb  

## 2017-11-02 ENCOUNTER — Encounter: Payer: Federal, State, Local not specified - PPO | Admitting: Internal Medicine

## 2017-12-14 ENCOUNTER — Encounter: Payer: Federal, State, Local not specified - PPO | Admitting: Internal Medicine

## 2017-12-29 ENCOUNTER — Other Ambulatory Visit: Payer: Self-pay

## 2017-12-29 ENCOUNTER — Telehealth: Payer: Self-pay | Admitting: Internal Medicine

## 2017-12-29 MED ORDER — AMLODIPINE-OLMESARTAN 10-40 MG PO TABS: 1.0000 | ORAL_TABLET | Freq: Every day | ORAL | 0 refills | Status: DC

## 2017-12-29 NOTE — Telephone Encounter (Signed)
Copied from CRM [id number]. Topic: Quick Communication - See Telephone Encounter >> Dec 29, 2017  2:26 PM Everardo PacificMoton, [name], [place] wrote: CRM for notification. See Telephone encounter for: Patient calling because he needs a refill on his Amlodipine. Patient stated that he has been in contact with the pharmacy as well and he does have an appoinment to be seen in the office on 01-25-2018. If someone could give him a call back at [phone]  12/29/17.

## 2017-12-29 NOTE — Progress Notes (Signed)
Prescription refill amlodipine-olmesartan 10-40mg  tab per protocol, OV appointment 01/25/18.

## 2017-12-29 NOTE — Telephone Encounter (Signed)
Called patient at [phone] and left VM that amlodipine-olmesartan was refilled and it at pharmacy.

## 2018-01-25 ENCOUNTER — Encounter: Payer: Federal, State, Local not specified - PPO | Admitting: Internal Medicine

## 2018-02-05 ENCOUNTER — Telehealth: Payer: Self-pay | Admitting: Internal Medicine

## 2018-02-05 ENCOUNTER — Other Ambulatory Visit: Payer: Self-pay | Admitting: Internal Medicine

## 2018-02-05 MED ORDER — AMLODIPINE-OLMESARTAN 10-40 MG PO TABS: 1.0000 | ORAL_TABLET | Freq: Every day | ORAL | 0 refills | Status: DC

## 2018-02-05 NOTE — Telephone Encounter (Signed)
Amlodipine-Olmesartan refill request  (Azor) Last 2 OV cancelled by pt  12/14/17 and 01/25/18.   Last month's refill based on her coming in on 01/25/18.  [facility]t. [facility] [address]  [place], [place]

## 2018-02-05 NOTE — Telephone Encounter (Signed)
Per office policy sent 30 day to local pharmacy until appt.../lmb  

## 2018-02-05 NOTE — Telephone Encounter (Signed)
Patient has an appointment for when his CPE is due in March.

## 2018-02-05 NOTE — Addendum Note (Signed)
Addended by: Deatra JamesBRAND, Serafin [place] M on: 02/05/2018 03:33 PM   Modules accepted: Orders

## 2018-02-05 NOTE — Telephone Encounter (Signed)
Copied from CRM [phone]. Topic: Quick Communication - Rx Refill/Question >> Feb 05, 2018 11:52 AM Rudi CocoLathan, Anquinette Pierro M, NT wrote: Medication:  Amlodipine-olmesartan (azor)  Has the patient contacted their pharmacy? yes   (Agent: If no, request that the patient contact the pharmacy for the refill.)   Preferred Pharmacy (with phone number or street name): [facility] [place] [place] [address] Phone: [phone] Fax: [phone]     Agent: Please be advised that RX refills may take up to 3 business days. We ask that you follow-up with your pharmacy.

## 2018-03-08 ENCOUNTER — Other Ambulatory Visit: Payer: Self-pay | Admitting: Internal Medicine

## 2018-03-08 NOTE — Telephone Encounter (Signed)
Copied from CRM [id number]. Topic: Quick Communication - See Telephone Encounter >> Mar 08, 2018  1:32 PM Waymon AmatoBurton, Donna F wrote: CRM for notification. See Telephone encounter for:  Pt is needing a 30day supply of his refill for his [facility]   Best number [phone]  03/08/18.

## 2018-03-08 NOTE — Telephone Encounter (Signed)
Pt must keep March appt for further refills.

## 2018-03-09 MED ORDER — AMLODIPINE-OLMESARTAN 10-40 MG PO TABS: 1.0000 | ORAL_TABLET | Freq: Every day | ORAL | 0 refills | Status: DC

## 2018-03-09 NOTE — Telephone Encounter (Signed)
Per office policy sent 30 day to local pharmacy until appt.../lmb  

## 2018-03-09 NOTE — Telephone Encounter (Signed)
[facility] refill request  Per notes must keep March appt on 03/23/18 with Dr. Jonny RuizJohn for future refills.    [facility]  [place]  [id number]

## 2018-03-23 ENCOUNTER — Encounter: Payer: Self-pay | Admitting: Internal Medicine

## 2018-03-23 ENCOUNTER — Other Ambulatory Visit: Payer: Self-pay | Admitting: Internal Medicine

## 2018-03-23 ENCOUNTER — Other Ambulatory Visit (INDEPENDENT_AMBULATORY_CARE_PROVIDER_SITE_OTHER): Payer: Federal, State, Local not specified - PPO

## 2018-03-23 ENCOUNTER — Telehealth: Payer: Self-pay

## 2018-03-23 ENCOUNTER — Ambulatory Visit (INDEPENDENT_AMBULATORY_CARE_PROVIDER_SITE_OTHER): Payer: Federal, State, Local not specified - PPO | Admitting: Internal Medicine

## 2018-03-23 VITALS — BP 126/84 | HR 110 | Temp 98.1°F | Ht 72.0 in | Wt 181.0 lb

## 2018-03-23 DIAGNOSIS — Z0000 Encounter for general adult medical examination without abnormal findings: Secondary | ICD-10-CM

## 2018-03-23 DIAGNOSIS — E785 Hyperlipidemia, unspecified: Secondary | ICD-10-CM | POA: Diagnosis not present

## 2018-03-23 DIAGNOSIS — Z114 Encounter for screening for human immunodeficiency virus [HIV]: Secondary | ICD-10-CM | POA: Diagnosis not present

## 2018-03-23 DIAGNOSIS — E119 Type 2 diabetes mellitus without complications: Secondary | ICD-10-CM

## 2018-03-23 DIAGNOSIS — I10 Essential (primary) hypertension: Secondary | ICD-10-CM | POA: Diagnosis not present

## 2018-03-23 LAB — BASIC METABOLIC PANEL
BUN: 15 mg/dL (ref 6–23)
CALCIUM: 9.4 mg/dL (ref 8.4–10.5)
CO2: 30 meq/L (ref 19–32)
CREATININE: 0.99 mg/dL (ref 0.40–1.50)
Chloride: 97 mEq/L (ref 96–112)
GFR: 81.57 mL/min (ref >60.00)
Glucose, Bld: 281 mg/dL — ABNORMAL HIGH (ref 70–99)
Potassium: 4.8 mEq/L (ref 3.5–5.1)
Sodium: 136 mEq/L (ref 135–145)

## 2018-03-23 LAB — URINALYSIS, ROUTINE W REFLEX MICROSCOPIC
Bilirubin Urine: NEGATIVE
Hgb urine dipstick: NEGATIVE
Ketones, ur: NEGATIVE
LEUKOCYTES UA: NEGATIVE
Nitrite: NEGATIVE
RBC / HPF: NONE SEEN (ref 0–?)
SPECIFIC GRAVITY, URINE: 1.02 (ref 1.000–1.030)
TOTAL PROTEIN, URINE-UPE24: NEGATIVE
Urobilinogen, UA: 0.2 (ref 0.0–1.0)
pH: 6.5 (ref 5.0–8.0)

## 2018-03-23 LAB — LIPID PANEL
CHOL/HDL RATIO: 3
CHOLESTEROL: 224 mg/dL — AB (ref 0–200)
HDL: 72.4 mg/dL (ref >39.00)
LDL Cholesterol: 140 mg/dL — ABNORMAL HIGH (ref 0–99)
NonHDL: 152
Triglycerides: 59 mg/dL (ref 0.0–149.0)
VLDL: 11.8 mg/dL (ref 0.0–40.0)

## 2018-03-23 LAB — CBC WITH DIFFERENTIAL/PLATELET
BASOS ABS: 0 10*3/uL (ref 0.0–0.1)
Basophils Relative: 0.6 % (ref 0.0–3.0)
EOS ABS: 0.1 10*3/uL (ref 0.0–0.7)
Eosinophils Relative: 1.3 % (ref 0.0–5.0)
HEMATOCRIT: 43.8 % (ref 39.0–52.0)
HEMOGLOBIN: 15.2 g/dL (ref 13.0–17.0)
LYMPHS PCT: 24 % (ref 12.0–46.0)
Lymphs Abs: 1.5 10*3/uL (ref 0.7–4.0)
MCHC: 34.8 g/dL (ref 30.0–36.0)
MCV: 92.7 fl (ref 78.0–100.0)
MONO ABS: 0.8 10*3/uL (ref 0.1–1.0)
Monocytes Relative: 12.4 % — ABNORMAL HIGH (ref 3.0–12.0)
Neutro Abs: 3.9 10*3/uL (ref 1.4–7.7)
Neutrophils Relative %: 61.7 % (ref 43.0–77.0)
Platelets: 241 10*3/uL (ref 150.0–400.0)
RBC: 4.72 Mil/uL (ref 4.22–5.81)
RDW: 12.3 % (ref 11.5–15.5)
WBC: 6.4 10*3/uL (ref 4.0–10.5)

## 2018-03-23 LAB — PSA: PSA: 0.94 ng/mL (ref 0.10–4.00)

## 2018-03-23 LAB — HEPATIC FUNCTION PANEL
ALBUMIN: 4.2 g/dL (ref 3.5–5.2)
ALT: 30 U/L (ref 0–53)
AST: 21 U/L (ref 0–37)
Alkaline Phosphatase: 90 U/L (ref 39–117)
BILIRUBIN TOTAL: 0.6 mg/dL (ref 0.2–1.2)
Bilirubin, Direct: 0.1 mg/dL (ref 0.0–0.3)
Total Protein: 7.3 g/dL (ref 6.0–8.3)

## 2018-03-23 LAB — MICROALBUMIN / CREATININE URINE RATIO
Creatinine,U: 95.5 mg/dL
MICROALB/CREAT RATIO: 10.1 mg/g (ref 0.0–30.0)
Microalb, Ur: 9.6 mg/dL — ABNORMAL HIGH (ref 0.0–1.9)

## 2018-03-23 LAB — TSH: TSH: 1.19 u[IU]/mL (ref 0.35–4.50)

## 2018-03-23 LAB — HEMOGLOBIN A1C: Hgb A1c MFr Bld: 8.7 % — ABNORMAL HIGH (ref 4.6–6.5)

## 2018-03-23 MED ORDER — EMPAGLIFLOZIN 25 MG PO TABS: 25.0000 mg | ORAL_TABLET | Freq: Every day | ORAL | 3 refills | Status: DC

## 2018-03-23 MED ORDER — METFORMIN HCL ER 500 MG PO TB24: ORAL_TABLET | ORAL | 3 refills | Status: DC

## 2018-03-23 MED ORDER — AMLODIPINE-OLMESARTAN 10-40 MG PO TABS: 1.0000 | ORAL_TABLET | Freq: Every day | ORAL | 11 refills | Status: DC

## 2018-03-23 MED ORDER — AMLODIPINE-OLMESARTAN 10-40 MG PO TABS: 1.0000 | ORAL_TABLET | Freq: Every day | ORAL | 3 refills | Status: DC

## 2018-03-23 NOTE — Progress Notes (Signed)
Subjective:    Patient ID: Ethan Boyd, male    DOB: 1956-10-11, 62 y.o.   MRN: [id number]  HPI  Here for wellness and f/u;  Overall doing ok;  Pt denies Chest pain, worsening SOB, DOE, wheezing, orthopnea, PND, worsening LE edema, palpitations, dizziness or syncope.  Pt denies neurological change such as new headache, facial or extremity weakness.  Pt denies polydipsia, polyuria, or low sugar symptoms. Pt states overall good compliance with treatment and medications, good tolerability, and has been trying to follow appropriate diet.  Pt denies worsening depressive symptoms, suicidal ideation or panic. No fever, night sweats, wt loss, loss of appetite, or other constitutional symptoms.  Pt states good ability with ADL's, has low fall risk, home safety reviewed and adequate, no other significant changes in hearing or vision, and only occasionally active with exercise  Declines immunizations.  Refuses statin ever  No other interval hx or new complaints Past Medical History:  Diagnosis Date  . DIABETES MELLITUS, TYPE II 02/18/2009   Qualifier: Diagnosis of  By: Jonny RuizJohn MD, Len BlalockJames W   . Diverticulosis 08/31/2012   Colonoscopy, June 10, 2009, Dr Perry/GI  . ERECTILE DYSFUNCTION 02/18/2009   Qualifier: Diagnosis of  By: Jonny RuizJohn MD, Len BlalockJames W   . Erectile dysfunction 09/07/2012  . HYPERLIPIDEMIA 02/18/2009   Qualifier: Diagnosis of  By: Jonny RuizJohn MD, Len BlalockJames W   . HYPERTENSION 02/18/2009   Qualifier: Diagnosis of  By: Jonny RuizJohn MD, Len BlalockJames W    Past Surgical History:  Procedure Laterality Date  . right knee cartilage  1976    reports that he has never smoked. He has never used smokeless tobacco. He reports that he drinks alcohol. He reports that he does not use drugs. family history includes Diabetes in his unknown relative. Allergies  Allergen Reactions  . Aspirin     REACTION: angioedema   No current outpatient medications on file prior to visit.   No current facility-administered medications on file prior to  visit.    Review of Systems Constitutional: Negative for other unusual diaphoresis, sweats, appetite or weight changes HENT: Negative for other worsening hearing loss, ear pain, facial swelling, mouth sores or neck stiffness.   Eyes: Negative for other worsening pain, redness or other visual disturbance.  Respiratory: Negative for other stridor or swelling Cardiovascular: Negative for other palpitations or other chest pain  Gastrointestinal: Negative for worsening diarrhea or loose stools, blood in stool, distention or other pain Genitourinary: Negative for hematuria, flank pain or other change in urine volume.  Musculoskeletal: Negative for myalgias or other joint swelling.  Skin: Negative for other color change, or other wound or worsening drainage.  Neurological: Negative for other syncope or numbness. Hematological: Negative for other adenopathy or swelling Psychiatric/Behavioral: Negative for hallucinations, other worsening agitation, SI, self-injury, or new decreased concentration All other system neg per pt    Objective:   Physical Exam BP 126/84   Pulse (!) 110   Temp 98.1 F (36.7 C) (Oral)   Ht 6' (1.829 Boyd)   Wt 181 lb (82.1 kg)   SpO2 98%   BMI 24.55 kg/Boyd  VS noted,  Constitutional: Pt is oriented to person, place, and time. Appears well-developed and well-nourished, in no significant distress and comfortable Head: Normocephalic and atraumatic  Eyes: Conjunctivae and EOM are normal. Pupils are equal, round, and reactive to light Right Ear: External ear normal without discharge Left Ear: External ear normal without discharge Nose: Nose without discharge or deformity Mouth/Throat: Oropharynx is without other ulcerations  and moist  Neck: Normal range of motion. Neck supple. No JVD present. No tracheal deviation present or significant neck LA or mass Cardiovascular: Normal rate, regular rhythm, normal heart sounds and intact distal pulses.   Pulmonary/Chest: WOB normal  and breath sounds without rales or wheezing  Abdominal: Soft. Bowel sounds are normal. NT. No HSM  Musculoskeletal: Normal range of motion. Exhibits no edema Lymphadenopathy: Has no other cervical adenopathy.  Neurological: Pt is alert and oriented to person, place, and time. Pt has normal reflexes. No cranial nerve deficit. Motor grossly intact, Gait intact Skin: Skin is warm and dry. No rash noted or new ulcerations Psychiatric:  Has normal mood and affect. Behavior is normal without agitation No other exam findings     Assessment & Plan:

## 2018-03-23 NOTE — Telephone Encounter (Signed)
-----   Message from Corwin LevinsJames W John, MD sent at 03/23/2018 12:46 PM EDT ----- Letter sent, cont same tx except  The test results show that your current treatment is OK, except very mild protein was seen again in the urine, the LDL cholesterol is moderately high but stable, and the A1c is moderately high.  Please continue the blood pressure medication to help with the protein and let us know if you change your mind about starting the statin for cholesterol, with a goal being to be less than 70.    We need to add another medication to your metformin, called Jardiance 25 mg per day.  This type of medication has been shown to reduce your future heart disease risk, so this is preferred over the glipizide you were previously prescribed.  Please let us know if the [place] PepperJardiance is not covered well with your insurance, and ask the pharmacist which medication in the same family would be covered.  Priyanka Causey to please inform pt, I will do rx
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
## 2018-03-23 NOTE — Patient Instructions (Addendum)
Please make sure to have your yearly eye exam with ophthalmology  Please continue all other medications as before, and refills have been done if requested.  Please have the pharmacy call with any other refills you may need.  Please continue your efforts at being more active, low cholesterol diet, and weight control.  You are otherwise up to date with prevention measures today.  Please keep your appointments with your specialists as you may have planned  Please go to the LAB in the Basement (turn left off the elevator) for the tests to be done today  You will be contacted by phone if any changes need to be made immediately.  Otherwise, you will receive a letter about your results with an explanation, but please check with MyChart first.  Please remember to sign up for MyChart if you have not done so, as this will be important to you in the future with finding out test results, communicating by private email, and scheduling acute appointments online when needed.  Please return in 6 months, or sooner if needed, with Lab testing done 3-5 days before

## 2018-03-25 ENCOUNTER — Encounter: Payer: Self-pay | Admitting: Internal Medicine

## 2018-03-25 NOTE — Assessment & Plan Note (Signed)

## 2018-03-25 NOTE — Assessment & Plan Note (Signed)
stable overall by history and exam, recent data reviewed with pt, and pt to continue medical treatment as before,  to f/u any worsening symptoms or concerns, for f/u lab today 

## 2018-03-25 NOTE — Assessment & Plan Note (Signed)
BP Readings from Last 3 Encounters:  03/23/18 126/84  03/16/16 140/82  05/09/14 140/84  stable overall by history and exam, recent data reviewed with pt, and pt to continue medical treatment as before,  to f/u any worsening symptoms or concerns

## 2018-03-25 NOTE — Assessment & Plan Note (Signed)
stable overall by history and exam, recent data reviewed with pt, and pt to continue medical treatment as before,  to f/u any worsening symptoms or concerns, for f/u lipids, goal ldl < 70

## 2018-10-17 ENCOUNTER — Emergency Department (HOSPITAL_COMMUNITY)
Admission: EM | Admit: 2018-10-17 | Discharge: 2018-10-17 | Disposition: A | Discharge status: Home / Self Care | Payer: Worker's Compensation | Attending: Emergency Medicine | Admitting: Emergency Medicine

## 2018-10-17 ENCOUNTER — Emergency Department (HOSPITAL_COMMUNITY): Payer: Worker's Compensation

## 2018-10-17 ENCOUNTER — Encounter (HOSPITAL_COMMUNITY): Payer: Self-pay

## 2018-10-17 DIAGNOSIS — S43102A Unspecified dislocation of left acromioclavicular joint, initial encounter: Secondary | ICD-10-CM | POA: Diagnosis not present

## 2018-10-17 DIAGNOSIS — Z7984 Long term (current) use of oral hypoglycemic drugs: Secondary | ICD-10-CM | POA: Insufficient documentation

## 2018-10-17 DIAGNOSIS — I10 Essential (primary) hypertension: Secondary | ICD-10-CM | POA: Diagnosis not present

## 2018-10-17 DIAGNOSIS — Y999 Unspecified external cause status: Secondary | ICD-10-CM | POA: Insufficient documentation

## 2018-10-17 DIAGNOSIS — Z79899 Other long term (current) drug therapy: Secondary | ICD-10-CM | POA: Diagnosis not present

## 2018-10-17 DIAGNOSIS — Y92410 Unspecified street and highway as the place of occurrence of the external cause: Secondary | ICD-10-CM | POA: Insufficient documentation

## 2018-10-17 DIAGNOSIS — M25512 Pain in left shoulder: Secondary | ICD-10-CM | POA: Diagnosis not present

## 2018-10-17 DIAGNOSIS — S4992XA Unspecified injury of left shoulder and upper arm, initial encounter: Secondary | ICD-10-CM | POA: Diagnosis not present

## 2018-10-17 DIAGNOSIS — Y9389 Activity, other specified: Secondary | ICD-10-CM | POA: Insufficient documentation

## 2018-10-17 DIAGNOSIS — E119 Type 2 diabetes mellitus without complications: Secondary | ICD-10-CM | POA: Insufficient documentation

## 2018-10-17 MED ORDER — METHOCARBAMOL 500 MG PO TABS: 500.0000 mg | ORAL_TABLET | Freq: Two times a day (BID) | ORAL | 0 refills | Status: DC

## 2018-10-17 MED ORDER — HYDROCODONE-ACETAMINOPHEN 5-325 MG PO TABS: 1.0000 | ORAL_TABLET | Freq: Four times a day (QID) | ORAL | 0 refills | Status: DC | PRN
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
## 2018-10-17 NOTE — Discharge Instructions (Signed)
You can take Tylenol or Ibuprofen as directed for pain. You can alternate Tylenol and Ibuprofen every 4 hours. If you take Tylenol at 1pm, then you can take Ibuprofen at 5pm. Then you can take Tylenol again at 9pm.   You can take the pain medication for severe breakthrough pain.  Do not drive while taking them as will make you sleepy.  Take Robaxin as prescribed. This medication will make you drowsy so do not drive or drink alcohol when taking it.  Wear the sling for support and stabilization.  Apply ice to help with swelling.  As we discussed, follow-up with orthopedics as directed.  Return the emergency department for any worsening pain, numbness/weakness of the arm or hand or any other worsening or concerning symptoms.

## 2018-11-16 DIAGNOSIS — M7542 Impingement syndrome of left shoulder: Secondary | ICD-10-CM | POA: Diagnosis not present

## 2018-11-16 DIAGNOSIS — M25512 Pain in left shoulder: Secondary | ICD-10-CM | POA: Diagnosis not present

## 2019-04-15 ENCOUNTER — Other Ambulatory Visit: Payer: Self-pay | Admitting: Internal Medicine

## 2019-05-22 ENCOUNTER — Telehealth: Payer: Self-pay | Admitting: Internal Medicine

## 2019-05-22 NOTE — Telephone Encounter (Signed)
Pt called back to request refill, made aware that appointment would be necessary before refill request could be approved. He is adamantly against being scheduled for a visit. Made aware of virtual visits to avoid exposure, pt still refused and insisted that refill be processed without scheduling a visit. Please advise Best Contact: [phone]

## 2019-05-23 MED ORDER — METFORMIN HCL ER 500 MG PO TB24: ORAL_TABLET | ORAL | 0 refills | Status: DC

## 2019-05-23 NOTE — Telephone Encounter (Signed)
Patient has scheduled in office appt for 7/21.

## 2019-05-23 NOTE — Telephone Encounter (Signed)
Refill has been sent to hold him over until OV

## 2019-05-23 NOTE — Telephone Encounter (Signed)
Denied.  Pt needs to schedule a virtual if he doesn't want to come in office.

## 2019-05-23 NOTE — Addendum Note (Signed)
Addended by: Roney Mans on: 05/23/2019 09:26 AM   Modules accepted: Orders

## 2019-06-03 ENCOUNTER — Other Ambulatory Visit: Payer: Self-pay | Admitting: Internal Medicine

## 2019-06-11 ENCOUNTER — Encounter: Payer: Self-pay | Admitting: Internal Medicine

## 2019-07-01 ENCOUNTER — Other Ambulatory Visit: Payer: Self-pay | Admitting: Internal Medicine

## 2019-07-16 ENCOUNTER — Encounter: Payer: Federal, State, Local not specified - PPO | Admitting: Internal Medicine

## 2019-08-02 ENCOUNTER — Other Ambulatory Visit: Payer: Self-pay | Admitting: Internal Medicine

## 2019-08-27 ENCOUNTER — Other Ambulatory Visit: Payer: Self-pay | Admitting: Internal Medicine

## 2019-09-16 ENCOUNTER — Encounter: Payer: Federal, State, Local not specified - PPO | Admitting: Internal Medicine

## 2019-10-31 ENCOUNTER — Other Ambulatory Visit: Payer: Self-pay

## 2019-10-31 DIAGNOSIS — Z20822 Contact with and (suspected) exposure to covid-19: Secondary | ICD-10-CM

## 2019-11-01 ENCOUNTER — Telehealth: Payer: Self-pay | Admitting: Internal Medicine

## 2019-11-01 ENCOUNTER — Ambulatory Visit (INDEPENDENT_AMBULATORY_CARE_PROVIDER_SITE_OTHER): Payer: Federal, State, Local not specified - PPO | Admitting: Internal Medicine

## 2019-11-01 DIAGNOSIS — R05 Cough: Secondary | ICD-10-CM

## 2019-11-01 DIAGNOSIS — R059 Cough, unspecified: Secondary | ICD-10-CM

## 2019-11-01 LAB — NOVEL CORONAVIRUS, NAA: SARS-CoV-2, NAA: NOT DETECTED

## 2019-11-01 MED ORDER — HYDROCODONE-HOMATROPINE 5-1.5 MG/5ML PO SYRP: 5.0000 mL | ORAL_SOLUTION | Freq: Four times a day (QID) | ORAL | 0 refills | Status: AC | PRN

## 2019-11-01 MED ORDER — AZITHROMYCIN 250 MG PO TABS: ORAL_TABLET | ORAL | 1 refills | Status: DC

## 2019-11-01 NOTE — Telephone Encounter (Signed)
Copied from [place] [id number]. Topic: General - Other >> Nov 01, 2019 10:16 AM Keene Breath wrote: Reason for CRM: Pharmacy called to get the diagnosis code for a script for Z-Pak.  Please call to discuss at [phone]

## 2019-11-01 NOTE — Progress Notes (Signed)
Patient ID: Ethan Boyd, male   DOB: May 25, 1956, 63 y.o.   MRN: [id number]  Phone visit  Cumulative time during 7-day interval 11 min, there was not an associated office visit for this concern within a 7 day period.  Verbal consent for services obtained from patient prior to services given.  Names of all persons present for services: Cathlean Cower, MD, patient  Chief complaint: cough  History, background, results pertinent:  Here with c/o cough now x 3 days after wife coworker tested COVID + and now wife COVID + and doing relatively well with loss of smell only and congestion.  Pt himself has COVID testing pending but has had non prod cough with fever to 102 and coughing so much he cannot sleep, with marked fatigue.  Pt denies chest pain, increased sob or doe, wheezing, orthopnea, PND, increased LE swelling, palpitations, dizziness or syncope. No chills, loss of taste or smell.  Pt denies new neurological symptoms such as new headache, or facial or extremity weakness or numbness   Pt denies polydipsia, polyuria   Past Medical History:  Diagnosis Date  . DIABETES MELLITUS, TYPE II 02/18/2009   Qualifier: Diagnosis of  By: Jenny Reichmann MD, Hunt Oris   . Diverticulosis 08/31/2012   Colonoscopy, June 10, 2009, Dr Perry/GI  . ERECTILE DYSFUNCTION 02/18/2009   Qualifier: Diagnosis of  By: Jenny Reichmann MD, Hunt Oris   . Erectile dysfunction 09/07/2012  . HYPERLIPIDEMIA 02/18/2009   Qualifier: Diagnosis of  By: Jenny Reichmann MD, Hunt Oris   . HYPERTENSION 02/18/2009   Qualifier: Diagnosis of  By: Jenny Reichmann MD, Hunt Oris    No results found for this or any previous visit (from the past 48 hour(s)).  Lab Results  Component Value Date   WBC 6.4 03/23/2018   HGB 15.2 03/23/2018   HCT 43.8 03/23/2018   PLT 241.0 03/23/2018   GLUCOSE 281 (H) 03/23/2018   CHOL 224 (H) 03/23/2018   TRIG 59.0 03/23/2018   HDL 72.40 03/23/2018   LDLDIRECT 136.4 09/10/2012   LDLCALC 140 (H) 03/23/2018   ALT 30 03/23/2018   AST 21 03/23/2018   NA 136  03/23/2018   K 4.8 03/23/2018   CL 97 03/23/2018   CREATININE 0.99 03/23/2018   BUN 15 03/23/2018   CO2 30 03/23/2018   TSH 1.19 03/23/2018   PSA 0.94 03/23/2018   HGBA1C 8.7 (H) 03/23/2018   MICROALBUR 9.6 (H) 03/23/2018    A/P/next steps:   Cough - likely COVID related, testing pending but cant r/o other, for zpack if covid neg, and cough med prn,  to f/u any worsening symptoms or concerns  Cathlean Cower MD

## 2019-11-01 NOTE — Telephone Encounter (Signed)
Dx has been given

## 2019-11-02 ENCOUNTER — Encounter: Payer: Self-pay | Admitting: Internal Medicine

## 2019-11-02 DIAGNOSIS — R05 Cough: Secondary | ICD-10-CM | POA: Insufficient documentation

## 2019-11-02 DIAGNOSIS — R059 Cough, unspecified: Secondary | ICD-10-CM | POA: Insufficient documentation

## 2019-11-02 NOTE — Patient Instructions (Signed)
pelase follow up on your COVID testing results  Please take all new medication as prescribed - the cough medicine, and zpack if covid neg  Please continue all other medications as before, and refills have been done if requested.  Please have the pharmacy call with any other refills you may need.  Please continue your efforts at being more active, low cholesterol diet, and weight control.  Please keep your appointments with your specialists as you may have planned

## 2019-11-02 NOTE — Assessment & Plan Note (Signed)
See notes

## 2019-11-04 ENCOUNTER — Telehealth: Payer: Self-pay | Admitting: General Practice

## 2019-11-04 ENCOUNTER — Telehealth: Payer: Self-pay

## 2019-11-04 NOTE — Telephone Encounter (Signed)
Negative COVID results given. Patient results "NOT Detected." Caller expressed understanding. ° °

## 2019-11-04 NOTE — Telephone Encounter (Signed)
Ok to cont cough med  As there can be false neg COVID testing, please go to UC or ED for any shortness of breath to r/o other such as pna (we are not allowed to have pt come to office for this)

## 2019-11-04 NOTE — Telephone Encounter (Signed)
Copied from [place] [id number]. Topic: General - Other >> Nov 04, 2019 10:45 AM Celene Kras A wrote: Reason for CRM: Pt called stating his wife was exposed to 2 people who tested positive for covid. Pt states his wife has also tested positive. Pt states he is experiencing symptoms but that he tested negative. Pt is concerned. Please advise.

## 2019-11-04 NOTE — Telephone Encounter (Signed)
Pt has been informed and expressed understanding.  

## 2019-11-04 NOTE — Telephone Encounter (Signed)
Pt wife is calling and the patient has a cough. Pt has tested neg for covid 19 . Pt is exhausted  Also.  [facility] [place]

## 2019-11-06 ENCOUNTER — Encounter: Payer: Federal, State, Local not specified - PPO | Admitting: Internal Medicine

## 2019-11-08 ENCOUNTER — Other Ambulatory Visit: Payer: Self-pay | Admitting: Internal Medicine

## 2019-11-08 ENCOUNTER — Telehealth: Payer: Self-pay | Admitting: Internal Medicine

## 2019-11-08 NOTE — Telephone Encounter (Signed)
azithromycin (ZITHROMAX Z-PAK) 250 MG tablet    Patient is requesting refill. He states pharmacy needs approval for a second refill.     [place], [place]

## 2019-11-11 NOTE — Telephone Encounter (Signed)
I would not recommend further zpack if not helped so far, should be ok with symptomatic med like cough med prn if no fever or sob or cp; may need to consider treatment for allergies as well

## 2019-11-11 NOTE — Telephone Encounter (Signed)
Per [employer] pt picked up refill of zpak on 11/10/19 and paid cash.

## 2019-11-29 ENCOUNTER — Other Ambulatory Visit: Payer: Self-pay | Admitting: Internal Medicine

## 2019-12-02 ENCOUNTER — Encounter: Payer: Federal, State, Local not specified - PPO | Admitting: Internal Medicine

## 2019-12-09 ENCOUNTER — Other Ambulatory Visit: Payer: Self-pay

## 2019-12-09 DIAGNOSIS — Z20822 Contact with and (suspected) exposure to covid-19: Secondary | ICD-10-CM

## 2019-12-10 LAB — NOVEL CORONAVIRUS, NAA: SARS-CoV-2, NAA: NOT DETECTED

## 2020-01-16 ENCOUNTER — Ambulatory Visit (INDEPENDENT_AMBULATORY_CARE_PROVIDER_SITE_OTHER): Payer: Federal, State, Local not specified - PPO | Admitting: Internal Medicine

## 2020-01-16 ENCOUNTER — Other Ambulatory Visit: Payer: Self-pay

## 2020-01-16 ENCOUNTER — Encounter: Payer: Self-pay | Admitting: Internal Medicine

## 2020-01-16 VITALS — BP 162/84 | HR 112 | Temp 98.8°F | Ht 72.0 in | Wt 180.0 lb

## 2020-01-16 DIAGNOSIS — E611 Iron deficiency: Secondary | ICD-10-CM

## 2020-01-16 DIAGNOSIS — E538 Deficiency of other specified B group vitamins: Secondary | ICD-10-CM | POA: Diagnosis not present

## 2020-01-16 DIAGNOSIS — I10 Essential (primary) hypertension: Secondary | ICD-10-CM

## 2020-01-16 DIAGNOSIS — E559 Vitamin D deficiency, unspecified: Secondary | ICD-10-CM

## 2020-01-16 DIAGNOSIS — Z0000 Encounter for general adult medical examination without abnormal findings: Secondary | ICD-10-CM | POA: Diagnosis not present

## 2020-01-16 DIAGNOSIS — Z20822 Contact with and (suspected) exposure to covid-19: Secondary | ICD-10-CM

## 2020-01-16 DIAGNOSIS — E119 Type 2 diabetes mellitus without complications: Secondary | ICD-10-CM

## 2020-01-16 DIAGNOSIS — Z23 Encounter for immunization: Secondary | ICD-10-CM

## 2020-01-16 DIAGNOSIS — E785 Hyperlipidemia, unspecified: Secondary | ICD-10-CM

## 2020-01-16 LAB — LIPID PANEL
Cholesterol: 254 mg/dL — ABNORMAL HIGH (ref 0–200)
HDL: 80.7 mg/dL (ref >39.00)
LDL Cholesterol: 162 mg/dL — ABNORMAL HIGH (ref 0–99)
NonHDL: 173.03
Total CHOL/HDL Ratio: 3
Triglycerides: 54 mg/dL (ref 0.0–149.0)
VLDL: 10.8 mg/dL (ref 0.0–40.0)

## 2020-01-16 LAB — URINALYSIS, ROUTINE W REFLEX MICROSCOPIC
Bilirubin Urine: NEGATIVE
Hgb urine dipstick: NEGATIVE
Leukocytes,Ua: NEGATIVE
Nitrite: NEGATIVE
RBC / HPF: NONE SEEN (ref 0–?)
Specific Gravity, Urine: 1.01 (ref 1.000–1.030)
Total Protein, Urine: NEGATIVE
Urine Glucose: >=1000 — AB
Urobilinogen, UA: 0.2 (ref 0.0–1.0)
pH: 6 (ref 5.0–8.0)

## 2020-01-16 LAB — CBC WITH DIFFERENTIAL/PLATELET
Basophils Absolute: 0.1 10*3/uL (ref 0.0–0.1)
Basophils Relative: 0.9 % (ref 0.0–3.0)
Eosinophils Absolute: 0.2 10*3/uL (ref 0.0–0.7)
Eosinophils Relative: 2.8 % (ref 0.0–5.0)
HCT: 45.7 % (ref 39.0–52.0)
Hemoglobin: 15.7 g/dL (ref 13.0–17.0)
Lymphocytes Relative: 23.1 % (ref 12.0–46.0)
Lymphs Abs: 1.4 10*3/uL (ref 0.7–4.0)
MCHC: 34.3 g/dL (ref 30.0–36.0)
MCV: 93.5 fl (ref 78.0–100.0)
Monocytes Absolute: 0.9 10*3/uL (ref 0.1–1.0)
Monocytes Relative: 14.4 % — ABNORMAL HIGH (ref 3.0–12.0)
Neutro Abs: 3.7 10*3/uL (ref 1.4–7.7)
Neutrophils Relative %: 58.8 % (ref 43.0–77.0)
Platelets: 225 10*3/uL (ref 150.0–400.0)
RBC: 4.89 Mil/uL (ref 4.22–5.81)
RDW: 12.3 % (ref 11.5–15.5)
WBC: 6.2 10*3/uL (ref 4.0–10.5)

## 2020-01-16 LAB — BASIC METABOLIC PANEL
BUN: 18 mg/dL (ref 6–23)
CO2: 31 mEq/L (ref 19–32)
Calcium: 9.7 mg/dL (ref 8.4–10.5)
Chloride: 94 mEq/L — ABNORMAL LOW (ref 96–112)
Creatinine, Ser: 1.08 mg/dL (ref 0.40–1.50)
GFR: 69.01 mL/min (ref >60.00)
Glucose, Bld: 291 mg/dL — ABNORMAL HIGH (ref 70–99)
Potassium: 5 mEq/L (ref 3.5–5.1)
Sodium: 132 mEq/L — ABNORMAL LOW (ref 135–145)

## 2020-01-16 LAB — IBC PANEL
Iron: 139 ug/dL (ref 42–165)
Saturation Ratios: 39.4 % (ref 20.0–50.0)
Transferrin: 252 mg/dL (ref 212.0–360.0)

## 2020-01-16 LAB — VITAMIN D 25 HYDROXY (VIT D DEFICIENCY, FRACTURES): VITD: 40.75 ng/mL (ref 30.00–100.00)

## 2020-01-16 LAB — HEPATIC FUNCTION PANEL
ALT: 20 U/L (ref 0–53)
AST: 17 U/L (ref 0–37)
Albumin: 4.3 g/dL (ref 3.5–5.2)
Alkaline Phosphatase: 105 U/L (ref 39–117)
Bilirubin, Direct: 0.1 mg/dL (ref 0.0–0.3)
Total Bilirubin: 0.5 mg/dL (ref 0.2–1.2)
Total Protein: 7.3 g/dL (ref 6.0–8.3)

## 2020-01-16 LAB — MICROALBUMIN / CREATININE URINE RATIO
Creatinine,U: 57 mg/dL
Microalb Creat Ratio: 16.5 mg/g (ref 0.0–30.0)
Microalb, Ur: 9.4 mg/dL — ABNORMAL HIGH (ref 0.0–1.9)

## 2020-01-16 LAB — VITAMIN B12: Vitamin B-12: 299 pg/mL (ref 211–911)

## 2020-01-16 LAB — TSH: TSH: 1.56 u[IU]/mL (ref 0.35–4.50)

## 2020-01-16 LAB — HEMOGLOBIN A1C: Hgb A1c MFr Bld: 9.8 % — ABNORMAL HIGH (ref 4.6–6.5)

## 2020-01-16 LAB — SARS-COV-2 IGG: SARS-COV-2 IgG: 26.63

## 2020-01-16 MED ORDER — ROSUVASTATIN CALCIUM 20 MG PO TABS: 20.0000 mg | ORAL_TABLET | Freq: Every day | ORAL | 3 refills | Status: DC

## 2020-01-16 NOTE — Progress Notes (Signed)
Subjective:    Patient ID: Ethan Boyd, male    DOB: 06/28/1956, 64 y.o.   MRN: [id number]  HPI  Here for wellness and f/u;  Overall doing ok;  Pt denies Chest pain, worsening SOB, DOE, wheezing, orthopnea, PND, worsening LE edema, palpitations, dizziness or syncope.  Pt denies neurological change such as new headache, facial or extremity weakness.  Pt denies polydipsia, polyuria, or low sugar symptoms. Pt states overall good compliance with treatment and medications, good tolerability, and has been trying to follow appropriate diet.  Pt denies worsening depressive symptoms, suicidal ideation or panic. No fever, night sweats, wt loss, loss of appetite, or other constitutional symptoms.  Pt states good ability with ADL's, has low fall risk, home safety reviewed and adequate, no other significant changes in hearing or vision, and only occasionally active with exercise. Had URI a few months ago, wondering now if might have been COVID infection  BP < 140/90 at home.  Willing to start statin  No new complaints Past Medical History:  Diagnosis Date  . DIABETES MELLITUS, TYPE II 02/18/2009   Qualifier: Diagnosis of  By: Jenny Reichmann MD, Hunt Oris   . Diverticulosis 08/31/2012   Colonoscopy, June 10, 2009, Dr Perry/GI  . ERECTILE DYSFUNCTION 02/18/2009   Qualifier: Diagnosis of  By: Jenny Reichmann MD, Hunt Oris   . Erectile dysfunction 09/07/2012  . HYPERLIPIDEMIA 02/18/2009   Qualifier: Diagnosis of  By: Jenny Reichmann MD, Hunt Oris   . HYPERTENSION 02/18/2009   Qualifier: Diagnosis of  By: Jenny Reichmann MD, Hunt Oris    Past Surgical History:  Procedure Laterality Date  . right knee cartilage  1976    reports that he has never smoked. He has never used smokeless tobacco. He reports current alcohol use. He reports that he does not use drugs. family history includes Diabetes in an other family member. Allergies  Allergen Reactions  . Aspirin     REACTION: angioedema   Current Outpatient Medications on File Prior to Visit  Medication Sig  Dispense Refill  . acetaminophen (TYLENOL) 500 MG tablet Take 1,000 mg by mouth every 4 (four) hours as needed for moderate pain.    Marland Kitchen amLODipine-olmesartan (AZOR) 10-40 MG tablet TAKE 1 TABLET ONCE DAILY. 30 tablet 2  . metFORMIN (GLUCOPHAGE-XR) 500 MG 24 hr tablet TAKE 2 TABLETS EACH MORNING. 180 tablet 0  . Multiple Vitamin (MULTIVITAMIN WITH MINERALS) TABS tablet Take 1 tablet by mouth daily.    . Omega-3 Fatty Acids (FISH OIL) 1200 MG CAPS Take by mouth.    . empagliflozin (JARDIANCE) 25 MG TABS tablet Take 25 mg by mouth daily. (Patient not taking: Reported on 10/17/2018) 90 tablet 3   No current facility-administered medications on file prior to visit.   Review of Systems All otherwise neg per pt     Objective:   Physical Exam BP (!) 162/84   Pulse (!) 112   Temp 98.8 F (37.1 C)   Ht 6' (1.829 m)   Wt 180 lb (81.6 kg)   SpO2 99%   BMI 24.41 kg/m  VS noted,  Constitutional: Pt appears in NAD HENT: Head: NCAT.  Right Ear: External ear normal.  Left Ear: External ear normal.  Eyes: . Pupils are equal, round, and reactive to light. Conjunctivae and EOM are normal Nose: without d/c or deformity Neck: Neck supple. Gross normal ROM Cardiovascular: Normal rate and regular rhythm.   Pulmonary/Chest: Effort normal and breath sounds without rales or wheezing.  Abd:  Soft, NT, ND, +  BS, no organomegaly Neurological: Pt is alert. At baseline orientation, motor grossly intact Skin: Skin is warm. No rashes, other new lesions, no LE edema Psychiatric: Pt behavior is normal without agitation  Lab Results  Component Value Date   WBC 6.2 01/16/2020   HGB 15.7 01/16/2020   HCT 45.7 01/16/2020   PLT 225.0 01/16/2020   GLUCOSE 291 (H) 01/16/2020   CHOL 254 (H) 01/16/2020   TRIG 54.0 01/16/2020   HDL 80.70 01/16/2020   LDLDIRECT 136.4 09/10/2012   LDLCALC 162 (H) 01/16/2020   ALT 20 01/16/2020   AST 17 01/16/2020   NA 132 (L) 01/16/2020   K 5.0 01/16/2020   CL 94 (L)  01/16/2020   CREATININE 1.08 01/16/2020   BUN 18 01/16/2020   CO2 31 01/16/2020   TSH 1.56 01/16/2020   PSA 0.94 03/23/2018   HGBA1C 9.8 (H) 01/16/2020   MICROALBUR 9.4 (H) 01/16/2020      Assessment & Plan:

## 2020-01-16 NOTE — Patient Instructions (Addendum)
You had the Tdap tetanus shot today  Please remember to make your appts with eye doctor and colonoscopy with Dr Marina Goodell  Please take all new medication as prescribed - the crestor 20 mg per day for cholesterol  Please check your Blood Pressure on a regular basis at home with the goal to be at least < 140/90  Please continue all other medications as before, and refills have been done if requested.  Please have the pharmacy call with any other refills you may need.  Please continue your efforts at being more active, low cholesterol diet, and weight control.  You are otherwise up to date with prevention measures today.  Please keep your appointments with your specialists as you may have planned  Please go to the LAB at the blood drawing area for the tests to be done  You will be contacted by phone if any changes need to be made immediately.  Otherwise, you will receive a letter about your results with an explanation, but please check with MyChart first.  Please remember to sign up for MyChart if you have not done so, as this will be important to you in the future with finding out test results, communicating by private email, and scheduling acute appointments online when needed.  Please make an Appointment to return in 6 months, or sooner if needed

## 2020-01-17 ENCOUNTER — Other Ambulatory Visit: Payer: Self-pay | Admitting: Internal Medicine

## 2020-01-17 MED ORDER — PIOGLITAZONE HCL 30 MG PO TABS: 30.0000 mg | ORAL_TABLET | Freq: Every day | ORAL | 3 refills | Status: DC

## 2020-01-19 ENCOUNTER — Encounter: Payer: Self-pay | Admitting: Internal Medicine

## 2020-01-19 NOTE — Assessment & Plan Note (Signed)
To start crestor 20 mg qd

## 2020-01-19 NOTE — Assessment & Plan Note (Signed)
stable overall by history and exam, recent data reviewed with pt, and pt to continue medical treatment as before,  to f/u any worsening symptoms or concerns  

## 2020-01-19 NOTE — Assessment & Plan Note (Signed)

## 2020-01-19 NOTE — Assessment & Plan Note (Signed)
For SAR 2 IgG

## 2020-01-21 ENCOUNTER — Encounter: Payer: Self-pay | Admitting: Internal Medicine

## 2020-01-21 DIAGNOSIS — Z0000 Encounter for general adult medical examination without abnormal findings: Secondary | ICD-10-CM

## 2020-01-21 NOTE — Telephone Encounter (Signed)
For some reason this was not done  Please ask pt to make a first floor lab appt for just the pSA to be done at his convenience

## 2020-02-14 ENCOUNTER — Other Ambulatory Visit: Payer: Self-pay | Admitting: Internal Medicine

## 2020-02-14 NOTE — Telephone Encounter (Signed)
Please refill as per office routine med refill policy (all routine meds refilled for 3 mo or monthly per pt preference up to one year from last visit, then month to month grace period for 3 mo, then further med refills will have to be denied)  

## 2020-02-29 ENCOUNTER — Other Ambulatory Visit: Payer: Self-pay | Admitting: Internal Medicine

## 2020-02-29 NOTE — Telephone Encounter (Signed)
Please refill as per office routine med refill policy (all routine meds refilled for 3 mo or monthly per pt preference up to one year from last visit, then month to month grace period for 3 mo, then further med refills will have to be denied)  

## 2020-03-30 ENCOUNTER — Ambulatory Visit: Payer: Federal, State, Local not specified - PPO | Attending: Internal Medicine

## 2020-03-30 DIAGNOSIS — Z23 Encounter for immunization: Secondary | ICD-10-CM

## 2020-03-30 NOTE — Progress Notes (Signed)
   Covid-19 Vaccination Clinic  Name:  Ethan Boyd    MRN: [id number] DOB: 1956-05-27  03/30/2020  [name] was observed post Covid-19 immunization for 15 minutes without incident. He was provided with Vaccine Information Sheet and instruction to access the V-Safe system.   [name] was instructed to call 911 with any severe reactions post vaccine: Marland Kitchen Difficulty breathing  . Swelling of face and throat  . A fast heartbeat  . A bad rash all over body  . Dizziness and weakness   Immunizations Administered    Name Date Dose VIS Date Route   Pfizer COVID-19 Vaccine 03/30/2020  1:18 PM 0.3 mL 12/06/2019 Intramuscular   Manufacturer: [employer], [employer]   Lot: [id number]   NDC: [address]-7

## 2020-04-21 ENCOUNTER — Ambulatory Visit: Payer: Federal, State, Local not specified - PPO | Attending: Internal Medicine

## 2020-04-21 DIAGNOSIS — Z23 Encounter for immunization: Secondary | ICD-10-CM

## 2020-04-21 NOTE — Progress Notes (Signed)
   Covid-19 Vaccination Clinic  Name:  Ethan Boyd    MRN: [id number] DOB: October 07, 1956  04/21/2020  [name] was observed post Covid-19 immunization for 15 minutes without incident. He was provided with Vaccine Information Sheet and instruction to access the V-Safe system.   [name] was instructed to call 911 with any severe reactions post vaccine: Marland Kitchen Difficulty breathing  . Swelling of face and throat  . A fast heartbeat  . A bad rash all over body  . Dizziness and weakness   Immunizations Administered    Name Date Dose VIS Date Route   Pfizer COVID-19 Vaccine 04/21/2020  1:09 PM 0.3 mL 02/19/2019 Intramuscular   Manufacturer: [employer], [employer]   Lot: [id number]   NDC: [address]-3

## 2020-08-17 ENCOUNTER — Other Ambulatory Visit: Payer: Self-pay | Admitting: Internal Medicine

## 2020-08-17 NOTE — Telephone Encounter (Signed)
Please refill as per office routine med refill policy (all routine meds refilled for 3 mo or monthly per pt preference up to one year from last visit, then month to month grace period for 3 mo, then further med refills will have to be denied)  

## 2020-11-14 ENCOUNTER — Other Ambulatory Visit: Payer: Self-pay | Admitting: Internal Medicine

## 2020-11-14 NOTE — Telephone Encounter (Signed)
Please refill as per office routine med refill policy (all routine meds refilled for 3 mo or monthly per pt preference up to one year from last visit, then month to month grace period for 3 mo, then further med refills will have to be denied)  

## 2020-11-16 ENCOUNTER — Other Ambulatory Visit: Payer: Self-pay | Admitting: Internal Medicine

## 2020-11-16 MED ORDER — AMLODIPINE-OLMESARTAN 10-40 MG PO TABS: 1.0000 | ORAL_TABLET | Freq: Every day | ORAL | 0 refills | Status: DC

## 2020-11-16 NOTE — Telephone Encounter (Signed)
    Patient calling for status of refill He has no medication remaining

## 2021-02-17 ENCOUNTER — Other Ambulatory Visit: Payer: Self-pay | Admitting: Internal Medicine

## 2021-02-17 NOTE — Telephone Encounter (Signed)
Please refill as per office routine med refill policy (all routine meds refilled for 3 mo or monthly per pt preference up to one year from last visit, then month to month grace period for 3 mo, then further med refills will have to be denied)  

## 2021-02-25 ENCOUNTER — Other Ambulatory Visit: Payer: Self-pay | Admitting: Internal Medicine

## 2021-02-25 NOTE — Telephone Encounter (Signed)
Please refill as per office routine med refill policy (all routine meds refilled for 3 mo or monthly per pt preference up to one year from last visit, then month to month grace period for 3 mo, then further med refills will have to be denied)  

## 2021-03-10 ENCOUNTER — Telehealth: Payer: Self-pay | Admitting: Internal Medicine

## 2021-03-10 NOTE — Telephone Encounter (Signed)
Spouse called to request TOC appointment. Scheduled with Dr Yetta Barre

## 2021-03-22 ENCOUNTER — Encounter: Payer: Self-pay | Admitting: Internal Medicine

## 2021-03-30 ENCOUNTER — Other Ambulatory Visit: Payer: Self-pay | Admitting: Internal Medicine

## 2021-03-30 NOTE — Telephone Encounter (Signed)
Please refill as per office routine med refill policy (all routine meds refilled for 3 mo or monthly per pt preference up to one year from last visit, then month to month grace period for 3 mo, then further med refills will have to be denied)  

## 2021-04-20 ENCOUNTER — Encounter: Payer: Federal, State, Local not specified - PPO | Admitting: Internal Medicine

## 2021-04-23 ENCOUNTER — Telehealth: Payer: Self-pay | Admitting: Internal Medicine

## 2021-04-23 NOTE — Telephone Encounter (Signed)
Please refill as per office routine med refill policy (all routine meds refilled for 3 mo or monthly per pt preference up to one year from last visit, then month to month grace period for 3 mo, then further med refills will have to be denied)  

## 2021-04-26 ENCOUNTER — Other Ambulatory Visit: Payer: Self-pay

## 2021-04-26 MED ORDER — METFORMIN HCL ER 500 MG PO TB24: ORAL_TABLET | ORAL | 0 refills | Status: DC

## 2021-04-26 NOTE — Telephone Encounter (Signed)
    Patient declined to make appointment with Dr Jonny Ruiz  Patient has a [facility] appointment in July with Dr Yetta Barre  He is requesting short supply until appointment

## 2021-04-26 NOTE — Telephone Encounter (Signed)
Metformin refilled per Dr. Gerhard Munch

## 2021-05-17 ENCOUNTER — Other Ambulatory Visit: Payer: Self-pay | Admitting: Internal Medicine

## 2021-05-17 NOTE — Telephone Encounter (Signed)
Please refill as per office routine med refill policy (all routine meds refilled for 3 mo or monthly per pt preference up to one year from last visit, then month to month grace period for 3 mo, then further med refills will have to be denied)  

## 2021-05-19 NOTE — Telephone Encounter (Signed)
1.Medication Requested: amLODipine-olmesartan (AZOR) 10-40 MG tablet    2. Pharmacy (Name, Street, [facility]): [facility] - [place], [place] - [address]  3. On Med List: yes   4. Last Visit with PCP: 01-16-20  5. Next visit date with PCP:   Agent: Please be advised that RX refills may take up to 3 business days. We ask that you follow-up with your pharmacy.

## 2021-06-26 ENCOUNTER — Telehealth: Payer: Self-pay | Admitting: Internal Medicine

## 2021-06-26 NOTE — Telephone Encounter (Signed)
Please refill as per office routine med refill policy (all routine meds refilled for 3 mo or monthly per pt preference up to one year from last visit, then month to month grace period for 3 mo, then further med refills will have to be denied)  

## 2021-06-30 NOTE — Telephone Encounter (Signed)
   Patient calling for status of refill for Metformin, he has no medication remaining

## 2021-07-01 NOTE — Telephone Encounter (Signed)
   Patient called again. He is requesting the status of the refill for Metformin

## 2021-07-15 ENCOUNTER — Ambulatory Visit: Payer: Federal, State, Local not specified - PPO | Admitting: Internal Medicine

## 2021-07-15 ENCOUNTER — Other Ambulatory Visit: Payer: Self-pay

## 2021-07-15 ENCOUNTER — Ambulatory Visit (INDEPENDENT_AMBULATORY_CARE_PROVIDER_SITE_OTHER): Payer: Federal, State, Local not specified - PPO

## 2021-07-15 ENCOUNTER — Encounter: Payer: Self-pay | Admitting: Internal Medicine

## 2021-07-15 VITALS — BP 168/84 | HR 112 | Temp 98.4°F | Resp 16 | Ht 72.0 in | Wt 185.4 lb

## 2021-07-15 DIAGNOSIS — Z794 Long term (current) use of insulin: Secondary | ICD-10-CM

## 2021-07-15 DIAGNOSIS — E118 Type 2 diabetes mellitus with unspecified complications: Secondary | ICD-10-CM

## 2021-07-15 DIAGNOSIS — I493 Ventricular premature depolarization: Secondary | ICD-10-CM

## 2021-07-15 DIAGNOSIS — I517 Cardiomegaly: Secondary | ICD-10-CM | POA: Diagnosis not present

## 2021-07-15 DIAGNOSIS — R9431 Abnormal electrocardiogram [ECG] [EKG]: Secondary | ICD-10-CM | POA: Diagnosis not present

## 2021-07-15 DIAGNOSIS — R911 Solitary pulmonary nodule: Secondary | ICD-10-CM

## 2021-07-15 DIAGNOSIS — Z0001 Encounter for general adult medical examination with abnormal findings: Secondary | ICD-10-CM

## 2021-07-15 DIAGNOSIS — R059 Cough, unspecified: Secondary | ICD-10-CM

## 2021-07-15 DIAGNOSIS — E1121 Type 2 diabetes mellitus with diabetic nephropathy: Secondary | ICD-10-CM | POA: Diagnosis not present

## 2021-07-15 DIAGNOSIS — E785 Hyperlipidemia, unspecified: Secondary | ICD-10-CM | POA: Diagnosis not present

## 2021-07-15 DIAGNOSIS — Z1211 Encounter for screening for malignant neoplasm of colon: Secondary | ICD-10-CM

## 2021-07-15 DIAGNOSIS — I10 Essential (primary) hypertension: Secondary | ICD-10-CM | POA: Diagnosis not present

## 2021-07-15 DIAGNOSIS — E119 Type 2 diabetes mellitus without complications: Secondary | ICD-10-CM

## 2021-07-15 DIAGNOSIS — Z125 Encounter for screening for malignant neoplasm of prostate: Secondary | ICD-10-CM

## 2021-07-15 DIAGNOSIS — M899 Disorder of bone, unspecified: Secondary | ICD-10-CM | POA: Insufficient documentation

## 2021-07-15 DIAGNOSIS — R000 Tachycardia, unspecified: Secondary | ICD-10-CM | POA: Diagnosis not present

## 2021-07-15 DIAGNOSIS — J449 Chronic obstructive pulmonary disease, unspecified: Secondary | ICD-10-CM | POA: Diagnosis not present

## 2021-07-15 LAB — CBC WITH DIFFERENTIAL/PLATELET
Basophils Absolute: 0.1 10*3/uL (ref 0.0–0.1)
Basophils Relative: 0.8 % (ref 0.0–3.0)
Eosinophils Absolute: 0.1 10*3/uL (ref 0.0–0.7)
Eosinophils Relative: 2 % (ref 0.0–5.0)
HCT: 44.3 % (ref 39.0–52.0)
Hemoglobin: 15.1 g/dL (ref 13.0–17.0)
Lymphocytes Relative: 25.9 % (ref 12.0–46.0)
Lymphs Abs: 1.8 10*3/uL (ref 0.7–4.0)
MCHC: 34 g/dL (ref 30.0–36.0)
MCV: 93.6 fl (ref 78.0–100.0)
Monocytes Absolute: 0.9 10*3/uL (ref 0.1–1.0)
Monocytes Relative: 13.2 % — ABNORMAL HIGH (ref 3.0–12.0)
Neutro Abs: 4.1 10*3/uL (ref 1.4–7.7)
Neutrophils Relative %: 58.1 % (ref 43.0–77.0)
Platelets: 231 10*3/uL (ref 150.0–400.0)
RBC: 4.73 Mil/uL (ref 4.22–5.81)
RDW: 12.2 % (ref 11.5–15.5)
WBC: 7 10*3/uL (ref 4.0–10.5)

## 2021-07-15 LAB — URINALYSIS, ROUTINE W REFLEX MICROSCOPIC
Bilirubin Urine: NEGATIVE
Hgb urine dipstick: NEGATIVE
Leukocytes,Ua: NEGATIVE
Nitrite: NEGATIVE
Specific Gravity, Urine: 1.015 (ref 1.000–1.030)
Total Protein, Urine: 30 — AB
Urine Glucose: >=1000 — AB
Urobilinogen, UA: 0.2 (ref 0.0–1.0)
pH: 6 (ref 5.0–8.0)

## 2021-07-15 LAB — D-DIMER, QUANTITATIVE: D-Dimer, Quant: 0.27 mcg/mL FEU (ref <0.50)

## 2021-07-15 LAB — LIPID PANEL
Cholesterol: 228 mg/dL — ABNORMAL HIGH (ref 0–200)
HDL: 76.2 mg/dL (ref >39.00)
LDL Cholesterol: 140 mg/dL — ABNORMAL HIGH (ref 0–99)
NonHDL: 151.9
Total CHOL/HDL Ratio: 3
Triglycerides: 60 mg/dL (ref 0.0–149.0)
VLDL: 12 mg/dL (ref 0.0–40.0)

## 2021-07-15 LAB — HEPATIC FUNCTION PANEL
ALT: 26 U/L (ref 0–53)
AST: 18 U/L (ref 0–37)
Albumin: 4.4 g/dL (ref 3.5–5.2)
Alkaline Phosphatase: 106 U/L (ref 39–117)
Bilirubin, Direct: 0.1 mg/dL (ref 0.0–0.3)
Total Bilirubin: 0.6 mg/dL (ref 0.2–1.2)
Total Protein: 7.4 g/dL (ref 6.0–8.3)

## 2021-07-15 LAB — MICROALBUMIN / CREATININE URINE RATIO
Creatinine,U: 83.4 mg/dL
Microalb Creat Ratio: 46.7 mg/g — ABNORMAL HIGH (ref 0.0–30.0)
Microalb, Ur: 39 mg/dL — ABNORMAL HIGH (ref 0.0–1.9)

## 2021-07-15 LAB — BASIC METABOLIC PANEL
BUN: 13 mg/dL (ref 6–23)
CO2: 30 mEq/L (ref 19–32)
Calcium: 9.8 mg/dL (ref 8.4–10.5)
Chloride: 94 mEq/L — ABNORMAL LOW (ref 96–112)
Creatinine, Ser: 1.13 mg/dL (ref 0.40–1.50)
GFR: 68.59 mL/min (ref >60.00)
Glucose, Bld: 308 mg/dL — ABNORMAL HIGH (ref 70–99)
Potassium: 5.2 mEq/L — ABNORMAL HIGH (ref 3.5–5.1)
Sodium: 133 mEq/L — ABNORMAL LOW (ref 135–145)

## 2021-07-15 LAB — TSH: TSH: 1.79 u[IU]/mL (ref 0.35–5.50)

## 2021-07-15 LAB — SARS-COV-2 IGG: SARS-COV-2 IgG: 18.75

## 2021-07-15 LAB — BRAIN NATRIURETIC PEPTIDE: Pro B Natriuretic peptide (BNP): 16 pg/mL (ref 0.0–100.0)

## 2021-07-15 LAB — TROPONIN I (HIGH SENSITIVITY): High Sens Troponin I: 6 ng/L (ref 2–17)

## 2021-07-15 LAB — HEMOGLOBIN A1C: Hgb A1c MFr Bld: 9.9 % — ABNORMAL HIGH (ref 4.6–6.5)

## 2021-07-15 LAB — PSA: PSA: 0.85 ng/mL (ref 0.10–4.00)

## 2021-07-15 MED ORDER — FREESTYLE LIBRE 2 SENSOR MISC: 1.0000 | Freq: Every day | 5 refills | Status: DC

## 2021-07-15 MED ORDER — ROSUVASTATIN CALCIUM 20 MG PO TABS: 20.0000 mg | ORAL_TABLET | Freq: Every day | ORAL | 1 refills | Status: DC

## 2021-07-15 MED ORDER — INSULIN PEN NEEDLE 31G X 6 MM MISC: 1.0000 | Freq: Every day | 1 refills | Status: DC

## 2021-07-15 MED ORDER — AMLODIPINE BESYLATE 5 MG PO TABS: 5.0000 mg | ORAL_TABLET | Freq: Every day | ORAL | 0 refills | Status: DC

## 2021-07-15 MED ORDER — METFORMIN HCL ER 750 MG PO TB24: 1500.0000 mg | ORAL_TABLET | Freq: Every day | ORAL | 1 refills | Status: DC

## 2021-07-15 MED ORDER — INDAPAMIDE 1.25 MG PO TABS: 1.2500 mg | ORAL_TABLET | Freq: Every day | ORAL | 0 refills | Status: DC

## 2021-07-15 MED ORDER — FREESTYLE LIBRE 2 READER DEVI: 1.0000 | Freq: Every day | 5 refills | Status: DC

## 2021-07-15 MED ORDER — SOLIQUA 100-33 UNT-MCG/ML ~~LOC~~ SOPN: 20.0000 [IU] | PEN_INJECTOR | Freq: Every day | SUBCUTANEOUS | 1 refills | Status: DC

## 2021-07-15 NOTE — Patient Instructions (Signed)
Health Maintenance, Male Adopting a healthy lifestyle and getting preventive care are important in promoting health and wellness. Ask your health care provider about: The right schedule for you to have regular tests and exams. Things you can do on your own to prevent diseases and keep yourself healthy. What should I know about diet, weight, and exercise? Eat a healthy diet  Eat a diet that includes plenty of vegetables, fruits, low-fat dairy products, and lean protein. Do not eat a lot of foods that are high in solid fats, added sugars, or sodium.  Maintain a healthy weight Body mass index (BMI) is a measurement that can be used to identify possible weight problems. It estimates body fat based on height and weight. Your health care provider can help determine your BMI and help you achieve or maintain ahealthy weight. Get regular exercise Get regular exercise. This is one of the most important things you can do for your health. Most adults should: Exercise for at least 150 minutes each week. The exercise should increase your heart rate and make you sweat (moderate-intensity exercise). Do strengthening exercises at least twice a week. This is in addition to the moderate-intensity exercise. Spend less time sitting. Even light physical activity can be beneficial. Watch cholesterol and blood lipids Have your blood tested for lipids and cholesterol at 65 years of age, then havethis test every 5 years. You may need to have your cholesterol levels checked more often if: Your lipid or cholesterol levels are high. You are older than 65 years of age. You are at high risk for heart disease. What should I know about cancer screening? Many types of cancers can be detected early and may often be prevented. Depending on your health history and family history, you may need to have cancer screening at various ages. This may include screening for: Colorectal cancer. Prostate cancer. Skin cancer. Lung  cancer. What should I know about heart disease, diabetes, and high blood pressure? Blood pressure and heart disease High blood pressure causes heart disease and increases the risk of stroke. This is more likely to develop in people who have high blood pressure readings, are of African descent, or are overweight. Talk with your health care provider about your target blood pressure readings. Have your blood pressure checked: Every 3-5 years if you are 18-39 years of age. Every year if you are 40 years old or older. If you are between the ages of 65 and 75 and are a current or former smoker, ask your health care provider if you should have a one-time screening for abdominal aortic aneurysm (AAA). Diabetes Have regular diabetes screenings. This checks your fasting blood sugar level. Have the screening done: Once every three years after age 45 if you are at a normal weight and have a low risk for diabetes. More often and at a younger age if you are overweight or have a high risk for diabetes. What should I know about preventing infection? Hepatitis B If you have a higher risk for hepatitis B, you should be screened for this virus. Talk with your health care provider to find out if you are at risk forhepatitis B infection. Hepatitis C Blood testing is recommended for: Everyone born from 1945 through 1965. Anyone with known risk factors for hepatitis C. Sexually transmitted infections (STIs) You should be screened each year for STIs, including gonorrhea and chlamydia, if: You are sexually active and are younger than 65 years of age. You are older than 65 years of age   and your health care provider tells you that you are at risk for this type of infection. Your sexual activity has changed since you were last screened, and you are at increased risk for chlamydia or gonorrhea. Ask your health care provider if you are at risk. Ask your health care provider about whether you are at high risk for HIV.  Your health care provider may recommend a prescription medicine to help prevent HIV infection. If you choose to take medicine to prevent HIV, you should first get tested for HIV. You should then be tested every 3 months for as long as you are taking the medicine. Follow these instructions at home: Lifestyle Do not use any products that contain nicotine or tobacco, such as cigarettes, e-cigarettes, and chewing tobacco. If you need help quitting, ask your health care provider. Do not use street drugs. Do not share needles. Ask your health care provider for help if you need support or information about quitting drugs. Alcohol use Do not drink alcohol if your health care provider tells you not to drink. If you drink alcohol: Limit how much you have to 0-2 drinks a day. Be aware of how much alcohol is in your drink. In the [place] one drink equals one 12 oz bottle of beer (355 mL), one 5 oz glass of wine (148 mL), or one 1 oz glass of hard liquor (44 mL). General instructions Schedule regular health, dental, and eye exams. Stay current with your vaccines. Tell your health care provider if: You often feel depressed. You have ever been abused or do not feel safe at home. Summary Adopting a healthy lifestyle and getting preventive care are important in promoting health and wellness. Follow your health care provider's instructions about healthy diet, exercising, and getting tested or screened for diseases. Follow your health care provider's instructions on monitoring your cholesterol and blood pressure. This information is not intended to replace advice given to you by your health care provider. Make sure you discuss any questions you have with your healthcare provider. Document Revised: 12/05/2018 Document Reviewed: 12/05/2018 Elsevier Patient Education  2022 [employer]  
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
## 2021-07-22 ENCOUNTER — Encounter: Payer: Self-pay | Admitting: Internal Medicine

## 2021-07-23 ENCOUNTER — Inpatient Hospital Stay: Admission: RE | Admit: 2021-07-23 | Payer: Federal, State, Local not specified - PPO | Source: Ambulatory Visit

## 2021-07-26 ENCOUNTER — Encounter: Payer: Self-pay | Admitting: Internal Medicine

## 2021-08-06 ENCOUNTER — Other Ambulatory Visit: Payer: Self-pay

## 2021-08-06 ENCOUNTER — Ambulatory Visit (INDEPENDENT_AMBULATORY_CARE_PROVIDER_SITE_OTHER)
Admission: RE | Admit: 2021-08-06 | Discharge: 2021-08-06 | Disposition: A | Discharge status: Home / Self Care | Payer: Federal, State, Local not specified - PPO | Source: Ambulatory Visit | Attending: Internal Medicine | Admitting: Internal Medicine

## 2021-08-06 DIAGNOSIS — M899 Disorder of bone, unspecified: Secondary | ICD-10-CM

## 2021-08-06 DIAGNOSIS — I700 Atherosclerosis of aorta: Secondary | ICD-10-CM | POA: Diagnosis not present

## 2021-08-06 DIAGNOSIS — R911 Solitary pulmonary nodule: Secondary | ICD-10-CM | POA: Diagnosis not present

## 2021-08-06 DIAGNOSIS — J449 Chronic obstructive pulmonary disease, unspecified: Secondary | ICD-10-CM | POA: Diagnosis not present

## 2021-08-06 DIAGNOSIS — R918 Other nonspecific abnormal finding of lung field: Secondary | ICD-10-CM | POA: Diagnosis not present

## 2021-08-08 ENCOUNTER — Encounter: Payer: Self-pay | Admitting: Internal Medicine

## 2021-08-09 ENCOUNTER — Other Ambulatory Visit: Payer: Self-pay | Admitting: Internal Medicine

## 2021-08-09 DIAGNOSIS — R911 Solitary pulmonary nodule: Secondary | ICD-10-CM | POA: Insufficient documentation

## 2021-08-09 MED ORDER — SULFAMETHOXAZOLE-TRIMETHOPRIM 800-160 MG PO TABS: 1.0000 | ORAL_TABLET | Freq: Two times a day (BID) | ORAL | 0 refills | Status: AC

## 2021-09-17 ENCOUNTER — Ambulatory Visit: Payer: Federal, State, Local not specified - PPO | Admitting: Cardiovascular Disease

## 2021-09-17 ENCOUNTER — Other Ambulatory Visit: Payer: Self-pay

## 2021-09-17 ENCOUNTER — Encounter: Payer: Self-pay | Admitting: Cardiovascular Disease

## 2021-09-17 VITALS — BP 131/79 | HR 116 | Ht 72.0 in | Wt 178.0 lb

## 2021-09-17 DIAGNOSIS — R9431 Abnormal electrocardiogram [ECG] [EKG]: Secondary | ICD-10-CM

## 2021-09-17 DIAGNOSIS — Z794 Long term (current) use of insulin: Secondary | ICD-10-CM

## 2021-09-17 DIAGNOSIS — I10 Essential (primary) hypertension: Secondary | ICD-10-CM | POA: Diagnosis not present

## 2021-09-17 DIAGNOSIS — E119 Type 2 diabetes mellitus without complications: Secondary | ICD-10-CM | POA: Diagnosis not present

## 2021-09-17 DIAGNOSIS — E7800 Pure hypercholesterolemia, unspecified: Secondary | ICD-10-CM

## 2021-09-17 NOTE — Progress Notes (Signed)
Cardiology Office Note:    Date:  09/18/2021   ID:  Claris Pong, DOB 1956-12-05, MRN [id number]  PCP:  Corwin Levins, MD   [facility] Providers Cardiologist:  None     Referring MD: Etta Grandchild, MD   No chief complaint on file. Ethan Boyd is a 65 y.o. male who is being seen today for the evaluation of abnormal electrocardiogram at the request of Etta Grandchild, MD.   History of Present Illness:    Ethan Boyd is a 65 y.o. male with a hx of type 2 diabetes mellitus and systemic hypertension and moderate hypercholesterolemia, referred in consultation for abnormalities on his electrocardiogram.  He is recently had some problems with upper airway congestion that has improved with antibiotics.  He had a cough productive of thick secretions and felt a little lightheaded.  His blood pressure was a little high.  He has not been taking any over-the-counter decongestants.  He is very physically active.  He mows his own lawn with a push mower and denies problems with angina or dyspnea.  He has not had syncope or palpitations.  He denies focal neurological complaints.  He has not had intermittent claudication or lower extremity edema.  He denies orthopnea or PND.  He is quite lean with a BMI of only 24, but despite this requires insulin for control of his blood sugar and his most recent hemoglobin A1c was quite high at 9.9%.  His blood pressure was quite high when he first checked in at 173/103, but he looked quite nervous.  After he had a chance to relax his blood pressure was 131/79, which is comparable to his usual readings at home.  Although his medication lists contain both amlodipine and indapamide, he is not taking the latter  His most recent lipid profile from July showed an elevated LDL cholesterol of 140, but he also has an excellent HDL at 76.  He was prescribed rosuvastatin 20 mg daily, which he's only been taking for about a month.  He has normal renal function, liver  function tests and TSH.  He is a retired Charity fundraiser.  Although he has not had a Conservation officer, historic buildings, his father was in [employer] and he has multiple family members in the [employer].  He is particularly proud of his 69 year old son who was in the [employer] and intends to apply for special forces training.  Past Medical History:  Diagnosis Date   DIABETES MELLITUS, TYPE II 02/18/2009   Qualifier: Diagnosis of  By: Jonny Ruiz MD, Len Blalock    Diverticulosis 08/31/2012   Colonoscopy, June 10, 2009, Dr Perry/GI   ERECTILE DYSFUNCTION 02/18/2009   Qualifier: Diagnosis of  By: Jonny Ruiz MD, Len Blalock    Erectile dysfunction 09/07/2012   HYPERLIPIDEMIA 02/18/2009   Qualifier: Diagnosis of  By: Jonny Ruiz MD, Len Blalock    HYPERTENSION 02/18/2009   Qualifier: Diagnosis of  By: Jonny Ruiz MD, Len Blalock     Past Surgical History:  Procedure Laterality Date   right knee cartilage  1976    Current Medications: Current Meds  Medication Sig   amLODipine (NORVASC) 5 MG tablet Take 1 tablet (5 mg total) by mouth daily.   Continuous Blood Gluc Receiver (FREESTYLE LIBRE 2 READER) DEVI 1 Act by Does not apply route daily.   Continuous Blood Gluc Sensor (FREESTYLE LIBRE 2 SENSOR) MISC 1 Act by Does not apply route daily.   indapamide (LOZOL) 1.25 MG tablet Take 1 tablet (1.25 mg total) by mouth  daily.   Insulin Glargine-Lixisenatide (SOLIQUA) 100-33 UNT-MCG/ML SOPN Inject 20 Units into the skin daily.   Insulin Pen Needle 31G X 6 MM MISC 1 Act by Does not apply route daily.   metFORMIN (GLUCOPHAGE XR) 750 MG 24 hr tablet Take 2 tablets (1,500 mg total) by mouth daily with breakfast.   Multiple Vitamin (MULTIVITAMIN WITH MINERALS) TABS tablet Take 1 tablet by mouth daily.   Omega-3 Fatty Acids (FISH OIL) 1200 MG CAPS Take by mouth.   rosuvastatin (CRESTOR) 20 MG tablet Take 1 tablet (20 mg total) by mouth daily.     Allergies:   Aspirin   Social History   Socioeconomic History   Marital status: Married    Spouse name: Not on file   Number  of children: Not on file   Years of education: Not on file   Highest education level: Not on file  Occupational History   Not on file  Tobacco Use   Smoking status: Never   Smokeless tobacco: Never  Substance and Sexual Activity   Alcohol use: Yes    Alcohol/week: 8.0 standard drinks    Types: 8 Cans of beer per week   Drug use: No   Sexual activity: Yes    Partners: Female  Other Topics Concern   Not on file  Social History Narrative   Not on file   Social Determinants of Health   Financial Resource Strain: Not on file  Food Insecurity: Not on file  Transportation Needs: Not on file  Physical Activity: Not on file  Stress: Not on file  Social Connections: Not on file     Family History: The patient's family history includes Diabetes in an other family member.  ROS:   Please see the history of present illness.     All other systems reviewed and are negative.  EKGs/Labs/Other Studies Reviewed:    The following studies were reviewed today: Notes from visit with Dr. Yetta Barre.  Labs.  ECG from visit with Dr. Yetta Barre.  EKG:  EKG is not ordered today.  The ekg ordered 07/15/2021 demonstrates sinus tachycardia with rare PVCs, left atrial abnormality, no repolarization changes.  Borderline QTC 458 ms.  Recent Labs: 07/15/2021: ALT 26; BUN 13; Creatinine, Ser 1.13; Hemoglobin 15.1; Platelets 231.0; Potassium 5.2 No hemolysis seen; Pro B Natriuretic peptide (BNP) 16.0; Sodium 133; TSH 1.79  Recent Lipid Panel    Component Value Date/Time   CHOL 228 (H) 07/15/2021 0939   TRIG 60.0 07/15/2021 0939   HDL 76.20 07/15/2021 0939   CHOLHDL 3 07/15/2021 0939   VLDL 12.0 07/15/2021 0939   LDLCALC 140 (H) 07/15/2021 0939   LDLDIRECT 136.4 09/10/2012 1223     Risk Assessment/Calculations:           Physical Exam:    VS:  BP 131/79   Pulse (!) 116   Ht 6' (1.829 m)   Wt 80.7 kg   SpO2 94%   BMI 24.14 kg/m     Wt Readings from Last 3 Encounters:  09/17/21 80.7 kg   07/15/21 84.1 kg  01/16/20 81.6 kg     GEN: Appears lean and fit.  Well nourished, well developed in no acute distress HEENT: Normal NECK: No JVD; No carotid bruits LYMPHATICS: No lymphadenopathy CARDIAC: Occasional ectopy on a background of mild tachycardia with otherwise RRR, no murmurs, rubs, gallops RESPIRATORY:  Clear to auscultation without rales, wheezing or rhonchi  ABDOMEN: Soft, non-tender, non-distended MUSCULOSKELETAL:  No edema; No deformity  SKIN: Warm  and dry NEUROLOGIC:  Alert and oriented x 3 PSYCHIATRIC:  Normal affect   ASSESSMENT:    1. Abnormal EKG   2. Essential hypertension   3. Hypercholesterolemia   4. Insulin-requiring or dependent type II diabetes mellitus (HCC)    PLAN:    In order of problems listed above:  Abnormal ECG: The most significant abnormality is the presence of sinus tachycardia with rare PVCs, which are also present on exam.  He is not bothered by palpitations and is not aware of he has no signs of CHF.  Generally appears well.  My best guess is that the tachycardia is related to hypovolemia, in turn related to glucosuria due to poorly controlled blood sugar.  He also appears rather nervous in the doctor's office.  Interestingly, the ECG from 2017 also shows sinus tachycardia with a rate of 104 bpm.  He does have a suggestion of left atrial abnormality and relatively high voltage which may be related to LVH, both in turn most likely related to hypertension.  No ischemic changes are seen.  I recommended an echocardiogram and a treadmill stress test. HTN: Quite high on arrival, but otherwise appears to be very well controlled based on follow-up assessment and his home reports.  He is not taking indapamide.  He is only on amlodipine 5 mg once daily.  He is not taking indapamide.  Consider switching to ARB for renal protection in the setting of insulin requiring diabetes. HLP: Agree with a prescription for rosuvastatin.  In the setting of diabetes  target LDL less than 100, preferably less than 70.  This dose of rosuvastatin should get in there.  Recheck in a couple of months. DM: Poorly controlled.  Hemoglobin A1c almost 10%.  He is lean and is taking a relatively low dose of insulin glargine/lixisenatide, suggesting he is fairly insulin sensitive despite the diagnosis of type 2 diabetes.  Target hemoglobin A1c less than 7%.   Shared Decision Making/Informed Consent The risks [chest pain, shortness of breath, cardiac arrhythmias, dizziness, blood pressure fluctuations, myocardial infarction, stroke/transient ischemic attack, and life-threatening complications (estimated to be 1 in 10,000)], benefits (risk stratification, diagnosing coronary artery disease, treatment guidance) and alternatives of an exercise tolerance test were discussed in detail with Ethan Boyd and he agrees to proceed.    Medication Adjustments/Labs and Tests Ordered: Current medicines are reviewed at length with the patient today.  Concerns regarding medicines are outlined above.  Orders Placed This Encounter  Procedures   Cardiac Stress Test: Informed Consent Details: Physician/Practitioner Attestation; Transcribe to consent form and obtain patient signature   Exercise Tolerance Test   ECHOCARDIOGRAM COMPLETE   No orders of the defined types were placed in this encounter.   Patient Instructions  Medication Instructions:  No changes *If you need a refill on your cardiac medications before your next appointment, please call your pharmacy*   Lab Work: None ordered If you have labs (blood work) drawn today and your tests are completely normal, you will receive your results only by: MyChart Message (if you have MyChart) OR A paper copy in the mail If you have any lab test that is abnormal or we need to change your treatment, we will call you to review the results.   Testing/Procedures: Your physician has requested that you have an echocardiogram.  Echocardiography is a painless test that uses sound waves to create images of your heart. It provides your doctor with information about the size and shape of your heart and how well  your heart's chambers and valves are working. You may receive an ultrasound enhancing agent through an IV if needed to better visualize your heart during the echo.This procedure takes approximately one hour. There are no restrictions for this procedure. This will take place at the [address], [address].   Your physician has requested that you have an exercise tolerance test. For further information please visit [email or web address] Please also follow instruction sheet, as given. This will take place at [address] [facility], [address]. Do not drink or eat foods with caffeine for 24 hours before the test. (Chocolate, coffee, tea, or energy drinks) If you use an inhaler, bring it with you to the test. Do not smoke for 4 hours before the test. Wear comfortable shoes and clothing.  Follow-Up: At [facility], you and your health needs are our priority.  As part of our continuing mission to provide you with exceptional heart care, we have created designated Provider Care Teams.  These Care Teams include your primary Cardiologist (physician) and Advanced Practice Providers (APPs -  Physician Assistants and Nurse Practitioners) who all work together to provide you with the care you need, when you need it.  We recommend signing up for the patient portal called "MyChart".  Sign up information is provided on this After Visit Summary.  MyChart is used to connect with patients for Virtual Visits (Telemedicine).  Patients are able to view lab/test results, encounter notes, upcoming appointments, etc.  Non-urgent messages can be sent to your provider as well.   To learn more about what you can do with MyChart, go to [email or web address].    Your next appointment:   Follow up as needed with Dr. Royann Shivers   Signed, Thurmon Fair, MD  09/18/2021 5:15 AM    [facility]

## 2021-09-17 NOTE — Patient Instructions (Signed)
Medication Instructions:  No changes *If you need a refill on your cardiac medications before your next appointment, please call your pharmacy*   Lab Work: None ordered If you have labs (blood work) drawn today and your tests are completely normal, you will receive your results only by: MyChart Message (if you have MyChart) OR A paper copy in the mail If you have any lab test that is abnormal or we need to change your treatment, we will call you to review the results.   Testing/Procedures: Your physician has requested that you have an echocardiogram. Echocardiography is a painless test that uses sound waves to create images of your heart. It provides your doctor with information about the size and shape of your heart and how well your heart's chambers and valves are working. You may receive an ultrasound enhancing agent through an IV if needed to better visualize your heart during the echo.This procedure takes approximately one hour. There are no restrictions for this procedure. This will take place at the [address], [address].   Your physician has requested that you have an exercise tolerance test. For further information please visit [email or web address] Please also follow instruction sheet, as given. This will take place at [address] [facility], [address]. Do not drink or eat foods with caffeine for 24 hours before the test. (Chocolate, coffee, tea, or energy drinks) If you use an inhaler, bring it with you to the test. Do not smoke for 4 hours before the test. Wear comfortable shoes and clothing.  Follow-Up: At [facility], you and your health needs are our priority.  As part of our continuing mission to provide you with exceptional heart care, we have created designated Provider Care Teams.  These Care Teams include your primary Cardiologist (physician) and Advanced Practice Providers (APPs -  Physician Assistants and Nurse Practitioners) who all work together to provide you with  the care you need, when you need it.  We recommend signing up for the patient portal called "MyChart".  Sign up information is provided on this After Visit Summary.  MyChart is used to connect with patients for Virtual Visits (Telemedicine).  Patients are able to view lab/test results, encounter notes, upcoming appointments, etc.  Non-urgent messages can be sent to your provider as well.   To learn more about what you can do with MyChart, go to [email or web address].    Your next appointment:   Follow up as needed with Dr. Royann Shivers

## 2021-09-18 ENCOUNTER — Encounter: Payer: Self-pay | Admitting: Cardiovascular Disease

## 2021-09-22 ENCOUNTER — Telehealth: Payer: Self-pay | Admitting: *Deleted

## 2021-09-22 NOTE — Telephone Encounter (Signed)
I called the patient. Left a message to explain that we need the ECHO and stress test completed before a colonoscopy can be done. Explained that he should contact his cardiologist and ask them to make this appointment.

## 2021-09-29 ENCOUNTER — Encounter: Payer: Self-pay | Admitting: Internal Medicine

## 2021-09-29 NOTE — Telephone Encounter (Signed)
Called pt 10-5- LM that the ECHO and Stress test must be completed before the 10-26 colon and If  not, the 10-26 colon will be canceled- neither test has been scheduled at this time-  Hilda Lias PV

## 2021-09-30 ENCOUNTER — Encounter (HOSPITAL_COMMUNITY): Payer: Self-pay | Admitting: Cardiovascular Disease

## 2021-10-20 ENCOUNTER — Encounter: Payer: Federal, State, Local not specified - PPO | Admitting: Internal Medicine

## 2021-11-01 ENCOUNTER — Ambulatory Visit (HOSPITAL_COMMUNITY): Payer: Federal, State, Local not specified - PPO

## 2021-11-01 ENCOUNTER — Other Ambulatory Visit: Payer: Self-pay | Admitting: Internal Medicine

## 2021-11-01 DIAGNOSIS — R911 Solitary pulmonary nodule: Secondary | ICD-10-CM

## 2021-11-10 ENCOUNTER — Other Ambulatory Visit: Payer: Federal, State, Local not specified - PPO

## 2021-11-12 ENCOUNTER — Other Ambulatory Visit: Payer: Self-pay | Admitting: Internal Medicine

## 2021-11-12 DIAGNOSIS — I10 Essential (primary) hypertension: Secondary | ICD-10-CM

## 2021-11-23 ENCOUNTER — Ambulatory Visit (HOSPITAL_COMMUNITY): Payer: Federal, State, Local not specified - PPO | Attending: Cardiovascular Disease

## 2021-11-23 ENCOUNTER — Other Ambulatory Visit: Payer: Self-pay

## 2021-11-23 ENCOUNTER — Ambulatory Visit (INDEPENDENT_AMBULATORY_CARE_PROVIDER_SITE_OTHER): Payer: Federal, State, Local not specified - PPO

## 2021-11-23 DIAGNOSIS — R9431 Abnormal electrocardiogram [ECG] [EKG]: Secondary | ICD-10-CM

## 2021-11-23 LAB — EXERCISE TOLERANCE TEST
Angina Index: 0
Duke Treadmill Score: -1
Estimated workload: 5.4
Exercise duration (min): 3 min
Exercise duration (sec): 44 s
MPHR: 155 {beats}/min
Peak HR: 155 {beats}/min
Percent HR: 100 %
RPE: 15
Rest HR: 113 {beats}/min
ST Depression (mm): 1 mm

## 2021-11-23 LAB — ECHOCARDIOGRAM COMPLETE
Area-P 1/2: 5.84 cm2
S' Lateral: 3.3 cm

## 2021-12-01 ENCOUNTER — Encounter: Payer: Self-pay | Admitting: Internal Medicine

## 2022-01-12 ENCOUNTER — Ambulatory Visit (AMBULATORY_SURGERY_CENTER): Payer: Federal, State, Local not specified - PPO | Admitting: *Deleted

## 2022-01-12 ENCOUNTER — Other Ambulatory Visit: Payer: Self-pay

## 2022-01-12 VITALS — Ht 72.0 in | Wt 177.0 lb

## 2022-01-12 DIAGNOSIS — Z1211 Encounter for screening for malignant neoplasm of colon: Secondary | ICD-10-CM

## 2022-01-12 MED ORDER — NA SULFATE-K SULFATE-MG SULF 17.5-3.13-1.6 GM/177ML PO SOLN: 1.0000 | Freq: Once | ORAL | 0 refills | Status: AC

## 2022-01-12 NOTE — Progress Notes (Signed)
No egg or soy allergy known to patient  No issues known to pt with past sedation with any surgeries or procedures Patient denies ever being told they had issues or difficulty with intubation  No FH of Malignant Hyperthermia Pt is not on diet pills Pt is not on  home 02  Pt is not on blood thinners  Pt denies issues with constipation  No A fib or A flutter  Pt has taken   Covid  shots,but not boosters.   Due to the COVID-19 pandemic we are asking patients to follow certain guidelines in PV and the [facility]   Pt aware of COVID protocols and [facility] guidelines   PV completed over the phone. Pt verified name, DOB, address and insurance during PV today.  Pt mailed instruction packet with copy of consent form to read and not return, and instructions.  Pt encouraged to call with questions or issues.  If pt has My chart, procedure instructions sent via My Chart

## 2022-01-20 ENCOUNTER — Telehealth: Payer: Self-pay

## 2022-01-20 ENCOUNTER — Other Ambulatory Visit: Payer: Self-pay | Admitting: Internal Medicine

## 2022-01-20 DIAGNOSIS — Z794 Long term (current) use of insulin: Secondary | ICD-10-CM

## 2022-01-20 DIAGNOSIS — E119 Type 2 diabetes mellitus without complications: Secondary | ICD-10-CM

## 2022-01-20 DIAGNOSIS — E118 Type 2 diabetes mellitus with unspecified complications: Secondary | ICD-10-CM

## 2022-01-20 MED ORDER — METFORMIN HCL ER 750 MG PO TB24: 1500.0000 mg | ORAL_TABLET | Freq: Every day | ORAL | 1 refills | Status: DC

## 2022-01-20 NOTE — Telephone Encounter (Signed)
Pt called to schedule an appt for a F/U. That date is 4/3 per pt request.  Pt is requesting a refill on: metFORMIN (GLUCOPHAGE XR) 750 MG 24 hr tablet  Pharmacy: [place], [address]  LOV: 07/15/21

## 2022-01-28 ENCOUNTER — Other Ambulatory Visit: Payer: Self-pay

## 2022-01-28 ENCOUNTER — Encounter: Payer: Self-pay | Admitting: Internal Medicine

## 2022-01-28 ENCOUNTER — Ambulatory Visit (AMBULATORY_SURGERY_CENTER): Payer: Medicare Other | Admitting: Internal Medicine

## 2022-01-28 VITALS — BP 149/89 | HR 93 | Temp 98.6°F | Resp 13 | Ht 72.0 in | Wt 177.0 lb

## 2022-01-28 DIAGNOSIS — Z1211 Encounter for screening for malignant neoplasm of colon: Secondary | ICD-10-CM | POA: Diagnosis not present

## 2022-01-28 MED ORDER — SODIUM CHLORIDE 0.9 % IV SOLN: 500.0000 mL | Freq: Once | INTRAVENOUS | Status: DC

## 2022-01-28 NOTE — Progress Notes (Signed)
HISTORY OF PRESENT ILLNESS:  Ethan Boyd is a 66 y.o. male presents for screening colonoscopy.  Negative index exam 2010.  No GI complaints.  Tolerated prep well  REVIEW OF SYSTEMS:  All non-GI ROS negative. Past Medical History:  Diagnosis Date   DIABETES MELLITUS, TYPE II 02/18/2009   Qualifier: Diagnosis of  By: Jonny Ruiz MD, Len Blalock    Diverticulosis 08/31/2012   Colonoscopy, June 10, 2009, Dr Keelee Yankey/GI   ERECTILE DYSFUNCTION 02/18/2009   Qualifier: Diagnosis of  By: Jonny Ruiz MD, Len Blalock    Erectile dysfunction 09/07/2012   HYPERLIPIDEMIA 02/18/2009   Qualifier: Diagnosis of  By: Jonny Ruiz MD, Len Blalock    HYPERTENSION 02/18/2009   Qualifier: Diagnosis of  By: Jonny Ruiz MD, Len Blalock     Past Surgical History:  Procedure Laterality Date   COLONOSCOPY  06/10/2009   right knee cartilage  12/26/1974    Social History [name]  reports that he has never smoked. He has never used smokeless tobacco. He reports current alcohol use of about 8.0 standard drinks per week. He reports that he does not use drugs.  family history includes Diabetes in an other family member.  Allergies  Allergen Reactions   Aspirin     REACTION: angioedema       PHYSICAL EXAMINATION:  Vital signs: BP 128/77    Pulse 85    Temp 98.6 F (37 C) (Temporal)    Resp 12    Ht 6' (1.829 m)    Wt 177 lb (80.3 kg)    SpO2 99%    BMI 24.01 kg/m  General: Well-developed, well-nourished, no acute distress HEENT: Sclerae are anicteric, conjunctiva pink. Oral mucosa intact Lungs: Clear Heart: Regular Abdomen: soft, nontender, nondistended, no obvious ascites, no peritoneal signs, normal bowel sounds. No organomegaly. Extremities: No edema Psychiatric: alert and oriented x3. Cooperative     ASSESSMENT:  Colon cancer screening  PLAN:    Screening colonoscopy

## 2022-01-28 NOTE — Progress Notes (Signed)
A and O x3. Report to RN. Tolerated MAC anesthesia well.

## 2022-01-28 NOTE — Progress Notes (Signed)
Pt's states no medical or surgical changes since previsit or office visit.  Vitals DT 

## 2022-01-28 NOTE — Op Note (Addendum)
[place] Patient Name: Ethan Boyd Procedure Date: 01/28/2022 12:24 PM MRN: [id number] Endoscopist: Docia Chuck. Henrene Pastor , MD Age: 66 Referring MD:  Date of Birth: 07-08-1956 Gender: Male Account #: [id number] Procedure:                Colonoscopy Indications:              Screening for colorectal malignant neoplasm.                            Negative index exam 2010 Medicines:                Monitored Anesthesia Care Procedure:                Pre-Anesthesia Assessment:                           - Prior to the procedure, a History and Physical                            was performed, and patient medications and                            allergies were reviewed. The patient's tolerance of                            previous anesthesia was also reviewed. The risks                            and benefits of the procedure and the sedation                            options and risks were discussed with the patient.                            All questions were answered, and informed consent                            was obtained. Prior Anticoagulants: The patient has                            taken no previous anticoagulant or antiplatelet                            agents. ASA Grade Assessment: II - A patient with                            mild systemic disease. After reviewing the risks                            and benefits, the patient was deemed in                            satisfactory condition to undergo the procedure.  After obtaining informed consent, the colonoscope                            was passed under direct vision. Throughout the                            procedure, the patient's blood pressure, pulse, and                            oxygen saturations were monitored continuously. The                            CF [id number] was introduced through the anus                            and advanced to the the cecum, identified  by                            appendiceal orifice and ileocecal valve. The                            ileocecal valve, appendiceal orifice, and rectum                            were photographed. The quality of the bowel                            preparation was excellent. The colonoscopy was                            performed without difficulty. The patient tolerated                            the procedure well. The bowel preparation used was                            SUPREP via split dose instruction. Scope In: 12:33:00 PM Scope Out: 12:45:39 PM Scope Withdrawal Time: 0 hours 8 minutes 29 seconds  Total Procedure Duration: 0 hours 12 minutes 39 seconds  Findings:                 Multiple diverticula were found in the left colon                            and right colon.                           The exam was otherwise without abnormality on                            direct and retroflexion views. Complications:            No immediate complications. Estimated blood loss:  None. Estimated Blood Loss:     Estimated blood loss: none. Impression:               - Diverticulosis in the left colon and in the right                            colon.                           - The examination was otherwise normal on direct                            and retroflexion views.                           - No specimens collected. Recommendation:           - Repeat colonoscopy in 10 years for screening                            purposes.                           - Patient has a contact number available for                            emergencies. The signs and symptoms of potential                            delayed complications were discussed with the                            patient. Return to normal activities tomorrow.                            Written discharge instructions were provided to the                            patient.                            - Resume previous diet.                           - Continue present medications. Docia Chuck. Henrene Pastor, MD 01/28/2022 12:52:42 PM This report has been signed electronically.

## 2022-01-28 NOTE — Patient Instructions (Signed)
Please read handouts provided. ?Continue present medications. ?Repeat colonoscopy in 10 years for screening. ? ? ?YOU HAD AN ENDOSCOPIC PROCEDURE TODAY AT THE [facility]:   Refer to the procedure report that was given to you for any specific questions about what was found during the examination.  If the procedure report does not answer your questions, please call your gastroenterologist to clarify.  If you requested that your care partner not be given the details of your procedure findings, then the procedure report has been included in a sealed envelope for you to review at your convenience later. ? ?YOU SHOULD EXPECT: Some feelings of bloating in the abdomen. Passage of more gas than usual.  Walking can help get rid of the air that was put into your GI tract during the procedure and reduce the bloating. If you had a lower endoscopy (such as a colonoscopy or flexible sigmoidoscopy) you may notice spotting of blood in your stool or on the toilet paper. If you underwent a bowel prep for your procedure, you may not have a normal bowel movement for a few days. ? ?Please Note:  You might notice some irritation and congestion in your nose or some drainage.  This is from the oxygen used during your procedure.  There is no need for concern and it should clear up in a day or so. ? ?SYMPTOMS TO REPORT IMMEDIATELY: ? ?Following lower endoscopy (colonoscopy or flexible sigmoidoscopy): ? Excessive amounts of blood in the stool ? Significant tenderness or worsening of abdominal pains ? Swelling of the abdomen that is new, acute ? Fever of 100?F or higher ? ? ?For urgent or emergent issues, a gastroenterologist can be reached at any hour by calling [phone]. ?Do not use MyChart messaging for urgent concerns.  ? ? ?DIET:  We do recommend a small meal at first, but then you may proceed to your regular diet.  Drink plenty of fluids but you should avoid alcoholic beverages for 24 hours. ? ?ACTIVITY:  You should  plan to take it easy for the rest of today and you should NOT DRIVE or use heavy machinery until tomorrow (because of the sedation medicines used during the test).   ? ?FOLLOW UP: ?Our staff will call the number listed on your records 48-72 hours following your procedure to check on you and address any questions or concerns that you may have regarding the information given to you following your procedure. If we do not reach you, we will leave a message.  We will attempt to reach you two times.  During this call, we will ask if you have developed any symptoms of COVID 19. If you develop any symptoms (ie: fever, flu-like symptoms, shortness of breath, cough etc.) before then, please call [phone].  If you test positive for Covid 19 in the 2 weeks post procedure, please call and report this information to us.   ? ?If any biopsies were taken you will be contacted by phone or by letter within the next 1-3 weeks.  Please call us at [phone] if you have not heard about the biopsies in 3 weeks.  ? ? ?SIGNATURES/CONFIDENTIALITY: ?You and/or your care partner have signed paperwork which will be entered into your electronic medical record.  These signatures attest to the fact that that the information above on your After Visit Summary has been reviewed and is understood.  Full responsibility of the confidentiality of this discharge information lies with you and/or your care-partner.  ?

## 2022-02-01 ENCOUNTER — Telehealth: Payer: Self-pay | Admitting: *Deleted

## 2022-02-01 ENCOUNTER — Telehealth: Payer: Self-pay

## 2022-02-01 NOTE — Telephone Encounter (Signed)
Attempted 2nd f/u phone call. No answer. Left message.  °

## 2022-02-01 NOTE — Telephone Encounter (Signed)
No answer, left message to call back later today, B.Ashelynn Marks RN. 

## 2022-02-02 ENCOUNTER — Other Ambulatory Visit: Payer: Self-pay | Admitting: Internal Medicine

## 2022-02-02 DIAGNOSIS — I10 Essential (primary) hypertension: Secondary | ICD-10-CM

## 2022-03-28 ENCOUNTER — Ambulatory Visit: Payer: Federal, State, Local not specified - PPO | Admitting: Internal Medicine

## 2022-05-11 ENCOUNTER — Other Ambulatory Visit: Payer: Self-pay | Admitting: Internal Medicine

## 2022-05-11 DIAGNOSIS — I10 Essential (primary) hypertension: Secondary | ICD-10-CM

## 2022-05-11 NOTE — Telephone Encounter (Signed)
Please refill as per office routine med refill policy (all routine meds to be refilled for 3 mo or monthly (per pt preference) up to one year from last visit, then month to month grace period for 3 mo, then further med refills will have to be denied) ? ?

## 2022-05-12 ENCOUNTER — Telehealth: Payer: Self-pay | Admitting: Internal Medicine

## 2022-05-12 DIAGNOSIS — I10 Essential (primary) hypertension: Secondary | ICD-10-CM

## 2022-05-12 NOTE — Telephone Encounter (Signed)
1.Medication Requested: amLODipine (NORVASC) 5 MG tablet 2. Pharmacy (Name, Street, [facility]): [facility] - [place], [place] - [address] Phone:  [phone]  Fax:  [phone]     3. On Med List:  4. Last Visit with PCP:  5. Next visit date with PCP:   Agent: Please be advised that RX refills may take up to 3 business days. We ask that you follow-up with your pharmacy.

## 2022-05-13 ENCOUNTER — Other Ambulatory Visit: Payer: Self-pay | Admitting: Internal Medicine

## 2022-05-13 DIAGNOSIS — I10 Essential (primary) hypertension: Secondary | ICD-10-CM

## 2022-05-13 MED ORDER — AMLODIPINE BESYLATE 5 MG PO TABS: 5.0000 mg | ORAL_TABLET | Freq: Every day | ORAL | 0 refills | Status: DC

## 2022-05-13 NOTE — Telephone Encounter (Signed)
Prescription sent to pharmacy.

## 2022-07-19 ENCOUNTER — Ambulatory Visit: Payer: Medicare Other | Admitting: Internal Medicine

## 2022-07-21 ENCOUNTER — Encounter: Payer: Self-pay | Admitting: Internal Medicine

## 2022-07-21 ENCOUNTER — Telehealth: Payer: Self-pay

## 2022-07-21 ENCOUNTER — Other Ambulatory Visit: Payer: Self-pay | Admitting: Internal Medicine

## 2022-07-21 DIAGNOSIS — E118 Type 2 diabetes mellitus with unspecified complications: Secondary | ICD-10-CM

## 2022-07-21 DIAGNOSIS — E119 Type 2 diabetes mellitus without complications: Secondary | ICD-10-CM

## 2022-07-21 NOTE — Telephone Encounter (Signed)
Spoke with patient and advised of refill policy. Patient was rude and used inappropriate language. I advised patient to refrain from using bad language. Patient then apologized and proceeded to express how our refill policy is "stupid". Firmly stated to patient that prescription would not be sent in until patient was seen for appointment.   Rescheduled patient's appointment from October to July 29, 2022.

## 2022-07-21 NOTE — Telephone Encounter (Signed)
Pt to be discharged for inappropriate behavior and language  Letter given to cma; need form to d/c as well

## 2022-07-21 NOTE — Telephone Encounter (Signed)
Pt is asking that a short supply of his metFORMIN (GLUCOPHAGE XR) 750 MG 24 hr tablet  Be sent in. He states he has an apptmnt on 09/27/2022 with PCP. Pt states he was seen by Yetta Barre last year and does not know why he can't get his meds. I informed him of the rules for med refills and he was upset of the rule.

## 2022-07-21 NOTE — Telephone Encounter (Signed)
Please refill as per office routine med refill policy (all routine meds to be refilled for 3 mo or monthly (per pt preference) up to one year from last visit, then month to month grace period for 3 mo, then further med refills will have to be denied) ? ?

## 2022-07-27 NOTE — Telephone Encounter (Signed)
Patient was informed by scheduler on the phone that he had been discharged from practice. Patient came up to office to inquire as to why he had been dismissed from practice and whether or not he still had an appointment to be seen on 8/4. Patient was informed that his dismissal letter was mailed this past Monday (7/31) and that it was for using inappropriate language with a staff member over the phone. Patient states that he was upset when he had the phone conversation but that he didn't feel like he crossed a line. Patient asked what he would do for primary care going forward and I advised him that once he found a new primary care we would be happy to forward his records. Patient states that he really wants to stay with Dr. Jonny Ruiz and Corinda Gubler and asked if Dr. Jonny Ruiz might change his mind. Patient was advised that this would be up to Dr. Jonny Ruiz and that he can discuss it with Dr. Jonny Ruiz at his visit on Friday.

## 2022-07-29 ENCOUNTER — Ambulatory Visit (INDEPENDENT_AMBULATORY_CARE_PROVIDER_SITE_OTHER): Payer: Medicare Other | Admitting: Internal Medicine

## 2022-07-29 ENCOUNTER — Other Ambulatory Visit: Payer: Self-pay | Admitting: Internal Medicine

## 2022-07-29 ENCOUNTER — Encounter: Payer: Self-pay | Admitting: Internal Medicine

## 2022-07-29 VITALS — BP 172/82 | HR 121 | Temp 98.2°F | Ht 72.0 in | Wt 174.0 lb

## 2022-07-29 DIAGNOSIS — Z0001 Encounter for general adult medical examination with abnormal findings: Secondary | ICD-10-CM

## 2022-07-29 DIAGNOSIS — E119 Type 2 diabetes mellitus without complications: Secondary | ICD-10-CM

## 2022-07-29 DIAGNOSIS — E559 Vitamin D deficiency, unspecified: Secondary | ICD-10-CM | POA: Diagnosis not present

## 2022-07-29 DIAGNOSIS — I10 Essential (primary) hypertension: Secondary | ICD-10-CM | POA: Diagnosis not present

## 2022-07-29 DIAGNOSIS — Z125 Encounter for screening for malignant neoplasm of prostate: Secondary | ICD-10-CM

## 2022-07-29 DIAGNOSIS — E538 Deficiency of other specified B group vitamins: Secondary | ICD-10-CM

## 2022-07-29 DIAGNOSIS — Z794 Long term (current) use of insulin: Secondary | ICD-10-CM | POA: Diagnosis not present

## 2022-07-29 DIAGNOSIS — J449 Chronic obstructive pulmonary disease, unspecified: Secondary | ICD-10-CM | POA: Insufficient documentation

## 2022-07-29 DIAGNOSIS — E785 Hyperlipidemia, unspecified: Secondary | ICD-10-CM | POA: Diagnosis not present

## 2022-07-29 DIAGNOSIS — E118 Type 2 diabetes mellitus with unspecified complications: Secondary | ICD-10-CM

## 2022-07-29 LAB — CBC WITH DIFFERENTIAL/PLATELET
Basophils Absolute: 0 10*3/uL (ref 0.0–0.1)
Basophils Relative: 0.6 % (ref 0.0–3.0)
Eosinophils Absolute: 0.1 10*3/uL (ref 0.0–0.7)
Eosinophils Relative: 1.6 % (ref 0.0–5.0)
HCT: 47.4 % (ref 39.0–52.0)
Hemoglobin: 15.9 g/dL (ref 13.0–17.0)
Lymphocytes Relative: 22.3 % (ref 12.0–46.0)
Lymphs Abs: 1.4 10*3/uL (ref 0.7–4.0)
MCHC: 33.7 g/dL (ref 30.0–36.0)
MCV: 95.9 fl (ref 78.0–100.0)
Monocytes Absolute: 0.7 10*3/uL (ref 0.1–1.0)
Monocytes Relative: 10.6 % (ref 3.0–12.0)
Neutro Abs: 4 10*3/uL (ref 1.4–7.7)
Neutrophils Relative %: 64.9 % (ref 43.0–77.0)
Platelets: 206 10*3/uL (ref 150.0–400.0)
RBC: 4.94 Mil/uL (ref 4.22–5.81)
RDW: 12.3 % (ref 11.5–15.5)
WBC: 6.1 10*3/uL (ref 4.0–10.5)

## 2022-07-29 LAB — MICROALBUMIN / CREATININE URINE RATIO
Creatinine,U: 54.7 mg/dL
Microalb Creat Ratio: 160.2 mg/g — ABNORMAL HIGH (ref 0.0–30.0)
Microalb, Ur: 87.7 mg/dL — ABNORMAL HIGH (ref 0.0–1.9)

## 2022-07-29 LAB — BASIC METABOLIC PANEL
BUN: 11 mg/dL (ref 6–23)
CO2: 31 mEq/L (ref 19–32)
Calcium: 9.4 mg/dL (ref 8.4–10.5)
Chloride: 96 mEq/L (ref 96–112)
Creatinine, Ser: 1.01 mg/dL (ref 0.40–1.50)
GFR: 77.91 mL/min (ref >60.00)
Glucose, Bld: 352 mg/dL — ABNORMAL HIGH (ref 70–99)
Potassium: 4.7 mEq/L (ref 3.5–5.1)
Sodium: 137 mEq/L (ref 135–145)

## 2022-07-29 LAB — URINALYSIS, ROUTINE W REFLEX MICROSCOPIC
Bilirubin Urine: NEGATIVE
Ketones, ur: 15 — AB
Leukocytes,Ua: NEGATIVE
Nitrite: NEGATIVE
Specific Gravity, Urine: 1.015 (ref 1.000–1.030)
Total Protein, Urine: 100 — AB
Urine Glucose: >=1000 — AB
Urobilinogen, UA: 0.2 (ref 0.0–1.0)
pH: 6 (ref 5.0–8.0)

## 2022-07-29 LAB — HEPATIC FUNCTION PANEL
ALT: 29 U/L (ref 0–53)
AST: 23 U/L (ref 0–37)
Albumin: 4.6 g/dL (ref 3.5–5.2)
Alkaline Phosphatase: 126 U/L — ABNORMAL HIGH (ref 39–117)
Bilirubin, Direct: 0.1 mg/dL (ref 0.0–0.3)
Total Bilirubin: 0.7 mg/dL (ref 0.2–1.2)
Total Protein: 7.9 g/dL (ref 6.0–8.3)

## 2022-07-29 LAB — TSH: TSH: 1.28 u[IU]/mL (ref 0.35–5.50)

## 2022-07-29 LAB — HEMOGLOBIN A1C: Hgb A1c MFr Bld: 9.9 % — ABNORMAL HIGH (ref 4.6–6.5)

## 2022-07-29 LAB — VITAMIN D 25 HYDROXY (VIT D DEFICIENCY, FRACTURES): VITD: 36.45 ng/mL (ref 30.00–100.00)

## 2022-07-29 LAB — LIPID PANEL
Cholesterol: 292 mg/dL — ABNORMAL HIGH (ref 0–200)
HDL: 82.2 mg/dL (ref >39.00)
LDL Cholesterol: 195 mg/dL — ABNORMAL HIGH (ref 0–99)
NonHDL: 210.03
Total CHOL/HDL Ratio: 4
Triglycerides: 74 mg/dL (ref 0.0–149.0)
VLDL: 14.8 mg/dL (ref 0.0–40.0)

## 2022-07-29 LAB — VITAMIN B12: Vitamin B-12: 577 pg/mL (ref 211–911)

## 2022-07-29 LAB — PSA: PSA: 1.07 ng/mL (ref 0.10–4.00)

## 2022-07-29 MED ORDER — AMLODIPINE BESYLATE 5 MG PO TABS: 5.0000 mg | ORAL_TABLET | Freq: Every day | ORAL | 3 refills | Status: DC

## 2022-07-29 MED ORDER — DAPAGLIFLOZIN PROPANEDIOL 10 MG PO TABS: 10.0000 mg | ORAL_TABLET | Freq: Every day | ORAL | 3 refills | Status: DC

## 2022-07-29 MED ORDER — INDAPAMIDE 1.25 MG PO TABS: 1.2500 mg | ORAL_TABLET | Freq: Every day | ORAL | 3 refills | Status: DC

## 2022-07-29 MED ORDER — SOLIQUA 100-33 UNT-MCG/ML ~~LOC~~ SOPN: 20.0000 [IU] | PEN_INJECTOR | Freq: Every day | SUBCUTANEOUS | 3 refills | Status: DC

## 2022-07-29 MED ORDER — ROSUVASTATIN CALCIUM 20 MG PO TABS: 20.0000 mg | ORAL_TABLET | Freq: Every day | ORAL | 3 refills | Status: DC

## 2022-07-29 MED ORDER — METFORMIN HCL ER 750 MG PO TB24: 1500.0000 mg | ORAL_TABLET | Freq: Every day | ORAL | 3 refills | Status: DC

## 2022-07-29 NOTE — Progress Notes (Signed)
Patient ID: Ethan Boyd, male   DOB: 1956/08/23, 66 y.o.   MRN: [id number]         Chief Complaint:: wellness exam and dm, hld, htn       HPI:  Ethan Boyd is a 66 y.o. male here for wellness exam; declines prevnar, eye exam referral, flu shot o/w up to date                        Also Pt denies chest pain, increased sob or doe, wheezing, orthopnea, PND, increased LE swelling, palpitations, dizziness or syncope.   Pt denies polydipsia, polyuria, or new focal neuro s/s.    Pt denies fever, wt loss, night sweats, loss of appetite, or other constitutional symptoms     Wt Readings from Last 3 Encounters:  07/29/22 174 lb (78.9 kg)  01/28/22 177 lb (80.3 kg)  01/12/22 177 lb (80.3 kg)   BP Readings from Last 3 Encounters:  07/29/22 (!) 172/82  01/28/22 (!) 149/89  09/17/21 131/79   Immunization History  Administered Date(s) Administered   Influenza Split 09/07/2012   Influenza Whole 09/25/2008   Influenza,inj,Quad PF,6+ Mos 11/09/2018, 09/17/2019   Influenza-Unspecified 09/25/2017   PFIZER(Purple Top)SARS-COV-2 Vaccination 03/30/2020, 04/21/2020   Td 02/18/2009   Tdap 01/15/2020, 01/16/2020   Zoster Recombinat (Shingrix) 10/01/2019, 12/23/2019   There are no preventive care reminders to display for this patient.     Past Medical History:  Diagnosis Date   DIABETES MELLITUS, TYPE II 02/18/2009   Qualifier: Diagnosis of  By: Jonny Ruiz MD, Len Blalock    Diverticulosis 08/31/2012   Colonoscopy, June 10, 2009, Dr Perry/GI   ERECTILE DYSFUNCTION 02/18/2009   Qualifier: Diagnosis of  By: Jonny Ruiz MD, Len Blalock    Erectile dysfunction 09/07/2012   HYPERLIPIDEMIA 02/18/2009   Qualifier: Diagnosis of  By: Jonny Ruiz MD, Len Blalock    HYPERTENSION 02/18/2009   Qualifier: Diagnosis of  By: Jonny Ruiz MD, Len Blalock    Past Surgical History:  Procedure Laterality Date   COLONOSCOPY  06/10/2009   right knee cartilage  12/26/1974    reports that he has never smoked. He has never used smokeless tobacco. He reports  current alcohol use of about 8.0 standard drinks of alcohol per week. He reports that he does not use drugs. family history includes Diabetes in an other family member. Allergies  Allergen Reactions   Aspirin     REACTION: angioedema   Current Outpatient Medications on File Prior to Visit  Medication Sig Dispense Refill   Multiple Vitamin (MULTIVITAMIN WITH MINERALS) TABS tablet Take 1 tablet by mouth daily.     Omega-3 Fatty Acids (FISH OIL) 1200 MG CAPS Take by mouth.     Continuous Blood Gluc Receiver (FREESTYLE LIBRE 2 READER) DEVI 1 Act by Does not apply route daily. (Patient not taking: Reported on 01/12/2022) 2 each 5   Continuous Blood Gluc Sensor (FREESTYLE LIBRE 2 SENSOR) MISC 1 Act by Does not apply route daily. (Patient not taking: Reported on 01/12/2022) 2 each 5   Insulin Pen Needle 31G X 6 MM MISC 1 Act by Does not apply route daily. (Patient not taking: Reported on 01/12/2022) 100 each 1   No current facility-administered medications on file prior to visit.        ROS:  All others reviewed and negative.  Objective        PE:  BP (!) 172/82 (BP Location: Right Arm, Patient Position: Sitting, Cuff Size: Large)  Pulse (!) 121   Temp 98.2 F (36.8 C) (Oral)   Ht 6' (1.829 m)   Wt 174 lb (78.9 kg)   SpO2 97%   BMI 23.60 kg/m                 Constitutional: Pt appears in NAD               HENT: Head: NCAT.                Right Ear: External ear normal.                 Left Ear: External ear normal.                Eyes: . Pupils are equal, round, and reactive to light. Conjunctivae and EOM are normal               Nose: without d/c or deformity               Neck: Neck supple. Gross normal ROM               Cardiovascular: Normal rate and regular rhythm.                 Pulmonary/Chest: Effort normal and breath sounds without rales or wheezing.                Abd:  Soft, NT, ND, + BS, no organomegaly               Neurological: Pt is alert. At baseline orientation,  motor grossly intact               Skin: Skin is warm. No rashes, no other new lesions, LE edema - none               Psychiatric: Pt behavior is normal without agitation   Micro: none  Cardiac tracings I have personally interpreted today:  none  Pertinent Radiological findings (summarize): none   Lab Results  Component Value Date   WBC 6.1 07/29/2022   HGB 15.9 07/29/2022   HCT 47.4 07/29/2022   PLT 206.0 07/29/2022   GLUCOSE 352 (H) 07/29/2022   CHOL 292 (H) 07/29/2022   TRIG 74.0 07/29/2022   HDL 82.20 07/29/2022   LDLDIRECT 136.4 09/10/2012   LDLCALC 195 (H) 07/29/2022   ALT 29 07/29/2022   AST 23 07/29/2022   NA 137 07/29/2022   K 4.7 07/29/2022   CL 96 07/29/2022   CREATININE 1.01 07/29/2022   BUN 11 07/29/2022   CO2 31 07/29/2022   TSH 1.28 07/29/2022   PSA 1.07 07/29/2022   HGBA1C 9.9 (H) 07/29/2022   MICROALBUR 87.7 (H) 07/29/2022   Assessment/Plan:  Ethan Boyd is a 66 y.o. White or Caucasian [1] male with  has a past medical history of DIABETES MELLITUS, TYPE II (02/18/2009), Diverticulosis (08/31/2012), ERECTILE DYSFUNCTION (02/18/2009), Erectile dysfunction (09/07/2012), HYPERLIPIDEMIA (02/18/2009), and HYPERTENSION (02/18/2009).  Encounter for well adult exam with abnormal findings Age and sex appropriate education and counseling updated with regular exercise and diet Referrals for preventative services - pt to call for eye exam soon Immunizations addressed - decliens prevnar for now and flu shot not in stock Smoking counseling  - none needed Evidence for depression or other mood disorder - none significant Most recent labs reviewed. I have personally reviewed and have noted: 1) the patient's medical and social history 2) The patient's current medications and supplements 3) The patient's  height, weight, and BMI have been recorded in the chart   COPD (chronic obstructive pulmonary disease) (HCC) Stable overall, to continue inhaler prn  Essential  hypertension BP Readings from Last 3 Encounters:  07/29/22 (!) 172/82  01/28/22 (!) 149/89  09/17/21 131/79   uncontrolled, pt to restart norvasc 5 mg and lozol 1.25 mg qd   Type II diabetes mellitus with manifestations (HCC) Lab Results  Component Value Date   HGBA1C 9.9 (H) 07/29/2022   Severe uncontrolled, pt to continue current medical treatment soliqua 20 units qd, metformn ER 750 mg - 2 qd, and add farxiga 10 mg qd, and refer endo   Hyperlipidemia LDL goal <70 Lab Results  Component Value Date   LDLCALC 195 (H) 07/29/2022   Severe uncontrolled, goal ldl < 70,, pt to restart crestor 20 mg qd   Vitamin D deficiency Last vitamin D Lab Results  Component Value Date   VD25OH 36.45 07/29/2022   Low, to start oral replacement  Followup: Return in about 6 months (around 01/29/2023).  Oliver Barre, MD 07/31/2022 8:48 PM [facility]

## 2022-07-29 NOTE — Patient Instructions (Signed)
Please take all new medication as prescribed - the farxiga 10 mg per day  If this is too expensive, we can consider the Jardiance 25 mg per day  Depending on the A1c, you may wish to be referred to Endocrinology  Please continue all other medications as before, and refills have been done if requested.  Please have the pharmacy call with any other refills you may need.  Please continue your efforts at being more active, low cholesterol diet, and weight control.  You are otherwise up to date with prevention measures today.  Please keep your appointments with your specialists as you may have planned  Please go to the LAB at the blood drawing area for the tests to be done  You will be contacted by phone if any changes need to be made immediately.  Otherwise, you will receive a letter about your results with an explanation, but please check with MyChart first.  Please remember to sign up for MyChart if you have not done so, as this will be important to you in the future with finding out test results, communicating by private email, and scheduling acute appointments online when needed.  Please make an Appointment to return in 6 months, or sooner if needed

## 2022-07-31 ENCOUNTER — Encounter: Payer: Self-pay | Admitting: Internal Medicine

## 2022-07-31 DIAGNOSIS — E559 Vitamin D deficiency, unspecified: Secondary | ICD-10-CM | POA: Insufficient documentation

## 2022-07-31 NOTE — Assessment & Plan Note (Signed)
Lab Results  Component Value Date   LDLCALC 195 (H) 07/29/2022   Severe uncontrolled, goal ldl < 70,, pt to restart crestor 20 mg qd

## 2022-07-31 NOTE — Assessment & Plan Note (Signed)
Last vitamin D Lab Results  Component Value Date   VD25OH 36.45 07/29/2022   Low, to start oral replacement

## 2022-07-31 NOTE — Assessment & Plan Note (Signed)
Stable overall, to continue inhaler prn 

## 2022-07-31 NOTE — Assessment & Plan Note (Signed)
Lab Results  Component Value Date   HGBA1C 9.9 (H) 07/29/2022   Severe uncontrolled, pt to continue current medical treatment soliqua 20 units qd, metformn ER 750 mg - 2 qd, and add farxiga 10 mg qd, and refer endo

## 2022-07-31 NOTE — Assessment & Plan Note (Signed)
Age and sex appropriate education and counseling updated with regular exercise and diet Referrals for preventative services - pt to call for eye exam soon Immunizations addressed - decliens prevnar for now and flu shot not in stock Smoking counseling  - none needed Evidence for depression or other mood disorder - none significant Most recent labs reviewed. I have personally reviewed and have noted: 1) the patient's medical and social history 2) The patient's current medications and supplements 3) The patient's height, weight, and BMI have been recorded in the chart

## 2022-07-31 NOTE — Assessment & Plan Note (Signed)
BP Readings from Last 3 Encounters:  07/29/22 (!) 172/82  01/28/22 (!) 149/89  09/17/21 131/79   uncontrolled, pt to restart norvasc 5 mg and lozol 1.25 mg qd

## 2022-09-27 ENCOUNTER — Ambulatory Visit: Payer: Medicare Other | Admitting: Internal Medicine

## 2022-10-11 ENCOUNTER — Ambulatory Visit (HOSPITAL_COMMUNITY)
Admit: 2022-10-11 | Discharge status: Absent without leave | Payer: Medicare Other | Attending: Family Medicine | Admitting: Family Medicine

## 2022-10-24 IMAGING — CT CT CHEST W/O CM
2 of 4 series · 15 of 36 positions shown, 18 images · non-contrast
Comparison: Chest x-ray 07/15/2021

CLINICAL DATA: Recent history of congestion, COPD. Faint nodule
seen on recent chest radiograph June 2021.

EXAM:
CT CHEST WITHOUT CONTRAST
TECHNIQUE: Multidetector CT imaging of the chest was performed following the
standard protocol without IV contrast.

[Series 2: thorax · axial · 0.73mm/px · z∈[-331,-53]mm · 12 of 165 slices shown, 15 images]
[im 13/165  mediastinal]
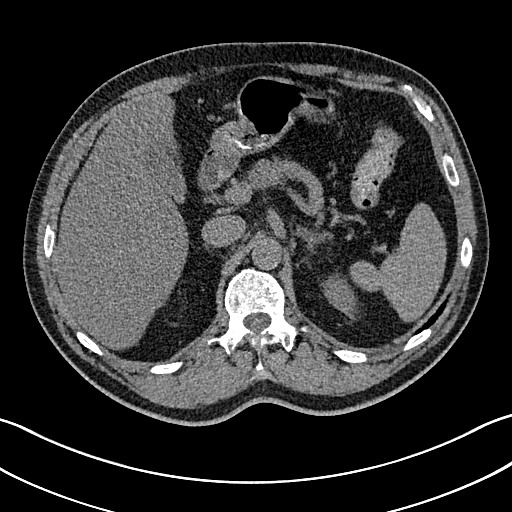
[im 13/165  lung]
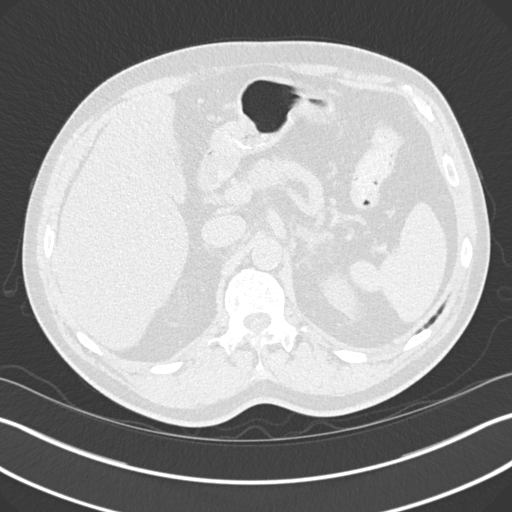
[im 26/165  lung]
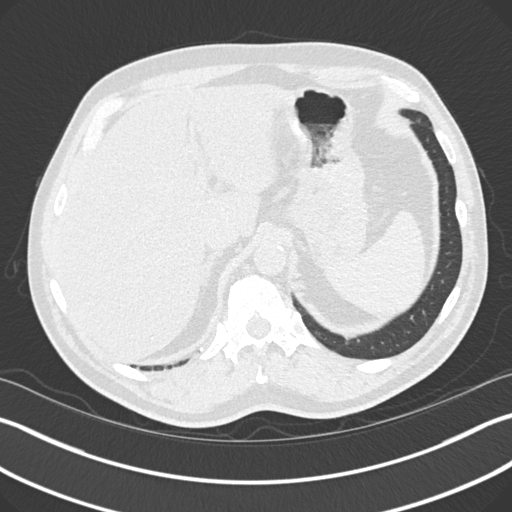
[im 38/165  lung]
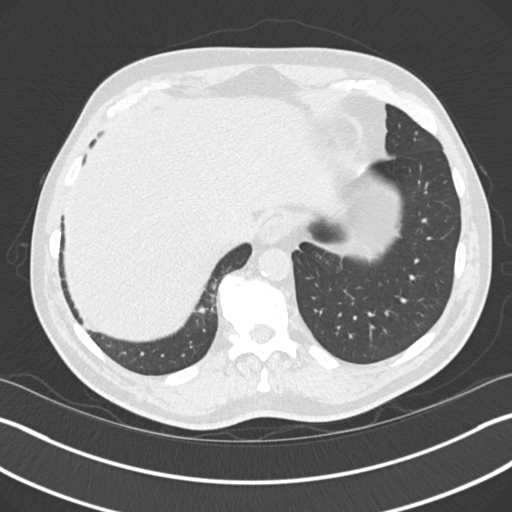
[im 51/165  lung]
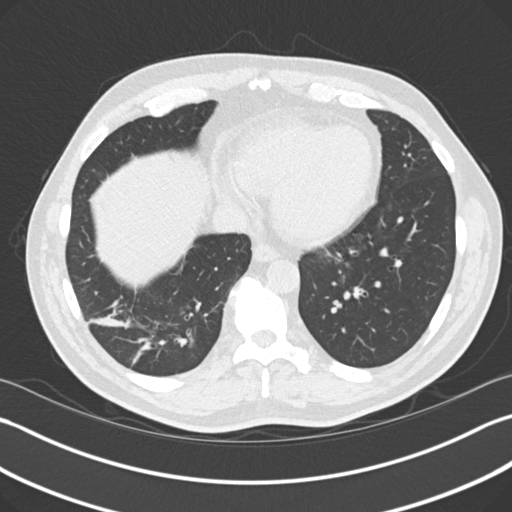
[im 64/165  mediastinal]
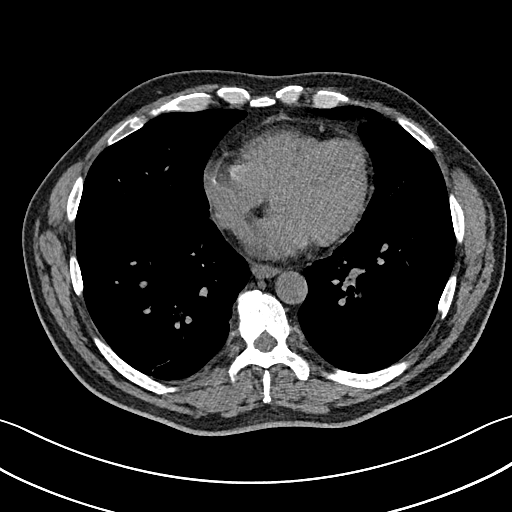
[im 64/165  lung]
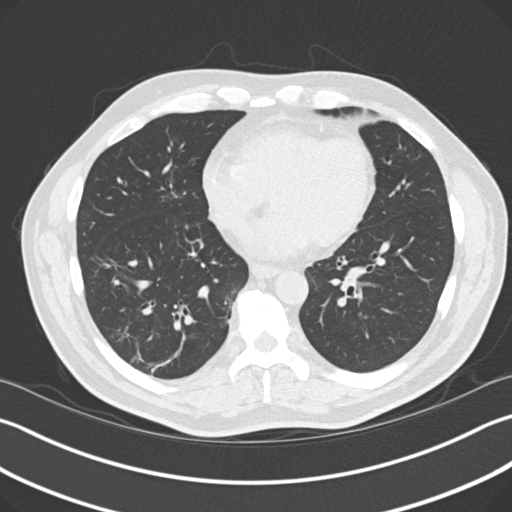
[im 76/165  lung]
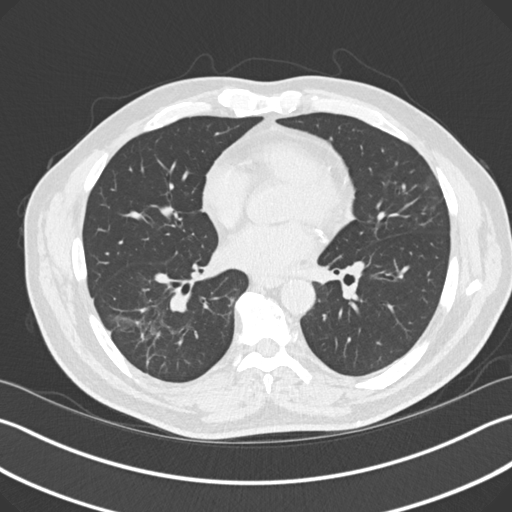
[im 89/165  lung]
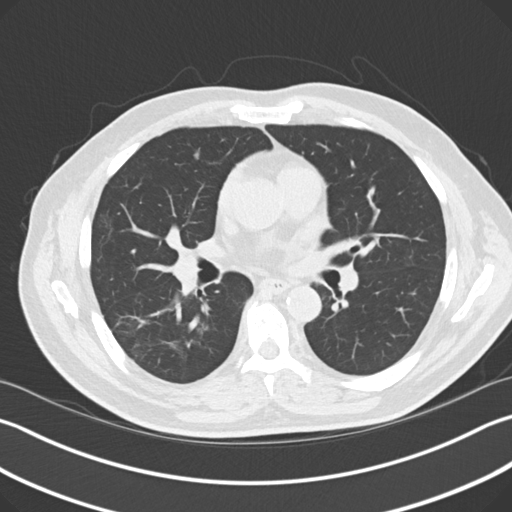
[im 101/165  lung]
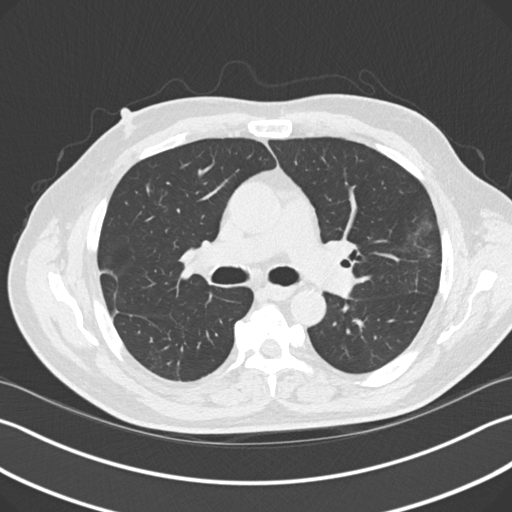
[im 114/165  mediastinal]
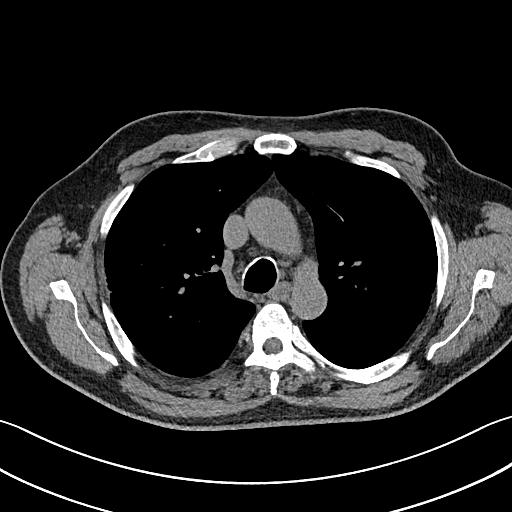
[im 114/165  lung]
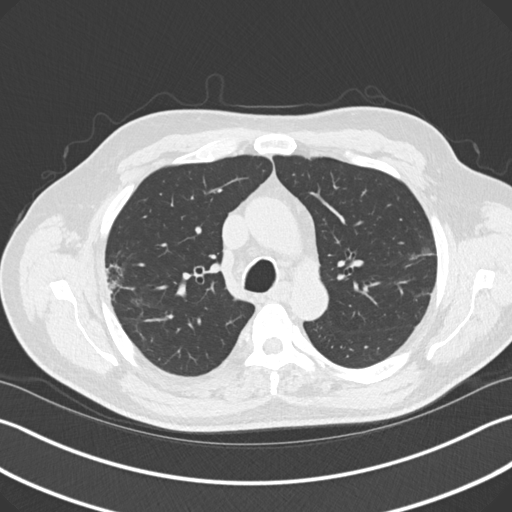
[im 127/165  lung]
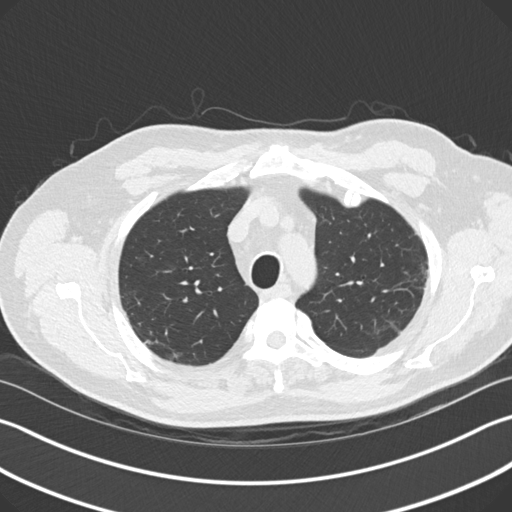
[im 139/165  lung]
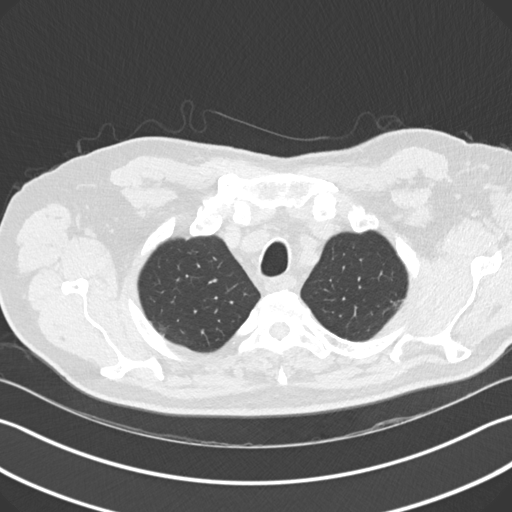
[im 152/165  lung]
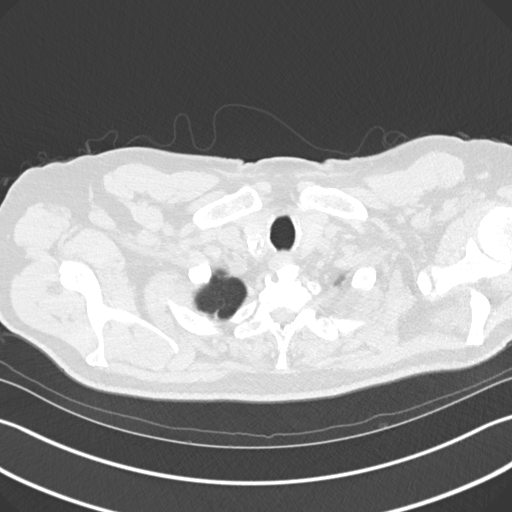

[Series 5: coronal · coronal · 0.64mm/px · 3 of 129 slices shown]
[im 26/129  lung]
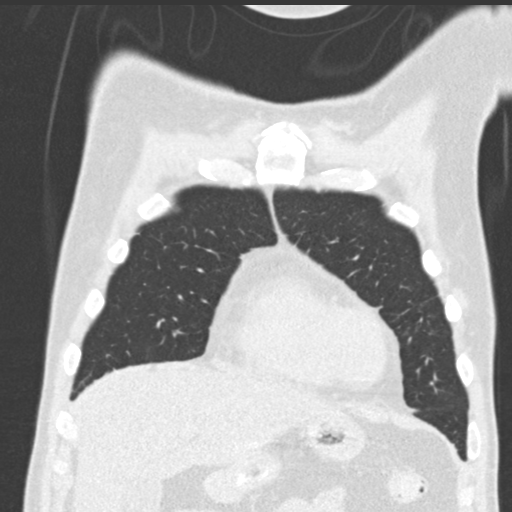
[im 52/129  lung]
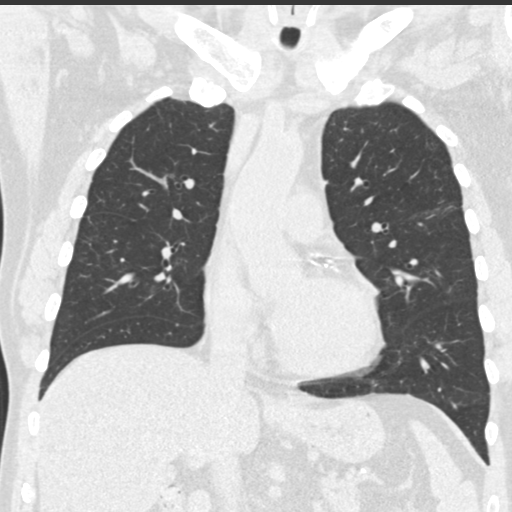
[im 77/129  lung]
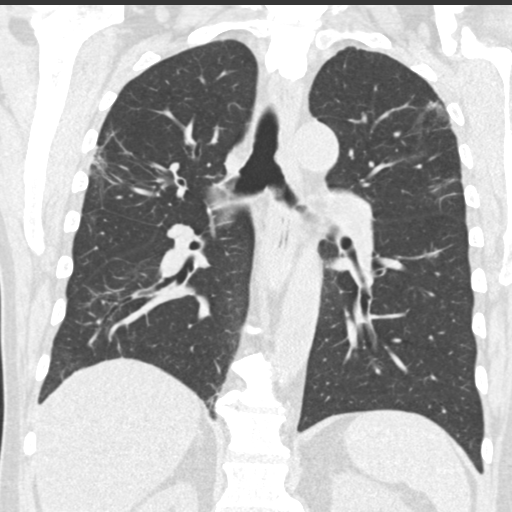

[15 of 36 positions shown; findings below may reference images not displayed]

FINDINGS: Cardiovascular: Heart is normal size. There is mild calcified plaque
over the left main and 3 vessel coronary arteries. Thoracic aorta is
within normal in caliber. There is minimal calcified plaque over the
descending thoracic aorta. Remaining vascular structures are
unremarkable.

Mediastinum/Nodes: No significant mediastinal or hilar adenopathy.
Remaining mediastinal structures are unremarkable.

Lungs/Pleura: Lungs are adequately inflated. Subtle focal reticular
density over the lateral right upper lobe. Mild reticular linear
density over the right lower lobe likely scarring/atelectasis. Faint
peripheral nodularity over the medial right lower lobe. 2 mm
calcified nodule over the left upper lobe. Mild focal reticular
density over the lateral left upper lobe. Several small calcified
granulomas over the lingula and left lower lobe. Subsolid 1.2 cm
nodule over the left lower lobe. No effusion. No airspace
consolidation. Airways are unremarkable.

Upper Abdomen: Calcified plaque over the abdominal aorta. No acute
findings.

Musculoskeletal: Degenerative changes of the spine. Sclerotic
densities projecting over the spine on the recent chest radiograph
for due to paravertebral osteophytes.
IMPRESSION: 1. No acute cardiopulmonary disease.
2. Subsolid 1.2 cm nodule over the left lower lobe. Follow-up
non-contrast CT recommended at 3 months to confirm persistence. If
unchanged, and solid component remains <6 mm, annual CT is
recommended until 5 years of stability has been established. If
persistent these nodules should be considered highly suspicious if
the solid component of the nodule is 6 mm or greater in size and
enlarging. This recommendation follows the consensus statement:
Guidelines for Management of Incidental Pulmonary Nodules Detected
[DATE].
3. Faint peripheral nodularity over the posteromedial right lower
lobe. This could also be re-evaluated on follow-up CT in 3 months.
Reticular density over the lateral upper lobes and right lower lobe
likely chronic.
4. Aortic atherosclerosis. Atherosclerotic coronary artery disease.
5. Sclerotic densities projecting over the spine on the recent chest
radiograph likely due to paravertebral osteophytes.

Aortic Atherosclerosis ([id number]-5K9.9).

## 2023-03-19 ENCOUNTER — Observation Stay (HOSPITAL_COMMUNITY)
Admission: EM | Admit: 2023-03-19 | Discharge: 2023-03-20 | Disposition: A | Discharge status: Home / Self Care | Payer: Medicare Other | Attending: Internal Medicine | Admitting: Internal Medicine

## 2023-03-19 ENCOUNTER — Observation Stay (HOSPITAL_COMMUNITY): Payer: Medicare Other

## 2023-03-19 ENCOUNTER — Emergency Department (HOSPITAL_COMMUNITY): Payer: Medicare Other

## 2023-03-19 ENCOUNTER — Encounter (HOSPITAL_COMMUNITY): Payer: Self-pay

## 2023-03-19 ENCOUNTER — Other Ambulatory Visit: Payer: Self-pay

## 2023-03-19 DIAGNOSIS — I10 Essential (primary) hypertension: Secondary | ICD-10-CM | POA: Insufficient documentation

## 2023-03-19 DIAGNOSIS — J449 Chronic obstructive pulmonary disease, unspecified: Secondary | ICD-10-CM | POA: Diagnosis not present

## 2023-03-19 DIAGNOSIS — E785 Hyperlipidemia, unspecified: Secondary | ICD-10-CM | POA: Diagnosis not present

## 2023-03-19 DIAGNOSIS — Z79899 Other long term (current) drug therapy: Secondary | ICD-10-CM | POA: Diagnosis not present

## 2023-03-19 DIAGNOSIS — Z7984 Long term (current) use of oral hypoglycemic drugs: Secondary | ICD-10-CM | POA: Diagnosis not present

## 2023-03-19 DIAGNOSIS — E119 Type 2 diabetes mellitus without complications: Secondary | ICD-10-CM | POA: Insufficient documentation

## 2023-03-19 DIAGNOSIS — Z794 Long term (current) use of insulin: Secondary | ICD-10-CM | POA: Diagnosis not present

## 2023-03-19 DIAGNOSIS — I639 Cerebral infarction, unspecified: Secondary | ICD-10-CM | POA: Diagnosis not present

## 2023-03-19 DIAGNOSIS — R4701 Aphasia: Secondary | ICD-10-CM | POA: Diagnosis present

## 2023-03-19 LAB — LIPID PANEL
Cholesterol: 254 mg/dL — ABNORMAL HIGH (ref 0–200)
HDL: 82 mg/dL (ref >40)
LDL Cholesterol: 132 mg/dL — ABNORMAL HIGH (ref 0–99)
Total CHOL/HDL Ratio: 3.1 RATIO
Triglycerides: 200 mg/dL — ABNORMAL HIGH (ref <150)
VLDL: 40 mg/dL (ref 0–40)

## 2023-03-19 LAB — I-STAT CHEM 8, ED
BUN: 7 mg/dL — ABNORMAL LOW (ref 8–23)
Calcium, Ion: 1.12 mmol/L — ABNORMAL LOW (ref 1.15–1.40)
Chloride: 90 mmol/L — ABNORMAL LOW (ref 98–111)
Creatinine, Ser: 1.1 mg/dL (ref 0.61–1.24)
Glucose, Bld: 198 mg/dL — ABNORMAL HIGH (ref 70–99)
HCT: 50 % (ref 39.0–52.0)
Hemoglobin: 17 g/dL (ref 13.0–17.0)
Potassium: 3.7 mmol/L (ref 3.5–5.1)
Sodium: 130 mmol/L — ABNORMAL LOW (ref 135–145)
TCO2: 26 mmol/L (ref 22–32)

## 2023-03-19 LAB — CBC
HCT: 44.9 % (ref 39.0–52.0)
Hemoglobin: 15.8 g/dL (ref 13.0–17.0)
MCH: 32.4 pg (ref 26.0–34.0)
MCHC: 35.2 g/dL (ref 30.0–36.0)
MCV: 92.2 fL (ref 80.0–100.0)
Platelets: 214 10*3/uL (ref 150–400)
RBC: 4.87 MIL/uL (ref 4.22–5.81)
RDW: 11.4 % — ABNORMAL LOW (ref 11.5–15.5)
WBC: 8.4 10*3/uL (ref 4.0–10.5)
nRBC: 0 % (ref 0.0–0.2)

## 2023-03-19 LAB — COMPREHENSIVE METABOLIC PANEL
ALT: 37 U/L (ref 0–44)
AST: 32 U/L (ref 15–41)
Albumin: 4.4 g/dL (ref 3.5–5.0)
Alkaline Phosphatase: 107 U/L (ref 38–126)
Anion gap: 18 — ABNORMAL HIGH (ref 5–15)
BUN: 9 mg/dL (ref 8–23)
CO2: 21 mmol/L — ABNORMAL LOW (ref 22–32)
Calcium: 9.3 mg/dL (ref 8.9–10.3)
Chloride: 90 mmol/L — ABNORMAL LOW (ref 98–111)
Creatinine, Ser: 0.81 mg/dL (ref 0.61–1.24)
GFR, Estimated: >60 mL/min (ref >60)
Glucose, Bld: 205 mg/dL — ABNORMAL HIGH (ref 70–99)
Potassium: 3.6 mmol/L (ref 3.5–5.1)
Sodium: 129 mmol/L — ABNORMAL LOW (ref 135–145)
Total Bilirubin: 0.6 mg/dL (ref 0.3–1.2)
Total Protein: 8.2 g/dL — ABNORMAL HIGH (ref 6.5–8.1)

## 2023-03-19 LAB — DIFFERENTIAL
Abs Immature Granulocytes: 0.02 10*3/uL (ref 0.00–0.07)
Basophils Absolute: 0 10*3/uL (ref 0.0–0.1)
Basophils Relative: 1 %
Eosinophils Absolute: 0.1 10*3/uL (ref 0.0–0.5)
Eosinophils Relative: 1 %
Immature Granulocytes: 0 %
Lymphocytes Relative: 28 %
Lymphs Abs: 2.3 10*3/uL (ref 0.7–4.0)
Monocytes Absolute: 0.9 10*3/uL (ref 0.1–1.0)
Monocytes Relative: 10 %
Neutro Abs: 5 10*3/uL (ref 1.7–7.7)
Neutrophils Relative %: 60 %

## 2023-03-19 LAB — CBG MONITORING, ED: Glucose-Capillary: 246 mg/dL — ABNORMAL HIGH (ref 70–99)

## 2023-03-19 LAB — PROTIME-INR
INR: 1 (ref 0.8–1.2)
Prothrombin Time: 12.9 seconds (ref 11.4–15.2)

## 2023-03-19 LAB — APTT: aPTT: 27 seconds (ref 24–36)

## 2023-03-19 LAB — GLUCOSE, CAPILLARY: Glucose-Capillary: 178 mg/dL — ABNORMAL HIGH (ref 70–99)

## 2023-03-19 MED ORDER — ROSUVASTATIN CALCIUM 20 MG PO TABS
20.0000 mg | ORAL_TABLET | Freq: Every day | ORAL | Status: DC
  Administered 2023-03-20: 20 mg via ORAL
  Filled 2023-03-19: qty 1

## 2023-03-19 MED ORDER — ACETAMINOPHEN 650 MG RE SUPP: 650.0000 mg | RECTAL | Status: DC | PRN

## 2023-03-19 MED ORDER — CLOPIDOGREL BISULFATE 75 MG PO TABS
75.0000 mg | ORAL_TABLET | Freq: Every day | ORAL | Status: DC
  Administered 2023-03-19 – 2023-03-20 (×2): 75 mg via ORAL
  Filled 2023-03-19 (×2): qty 1

## 2023-03-19 MED ORDER — ACETAMINOPHEN 160 MG/5ML PO SOLN: 650.0000 mg | ORAL | Status: DC | PRN

## 2023-03-19 MED ORDER — SODIUM CHLORIDE 0.9% FLUSH
3.0000 mL | Freq: Once | INTRAVENOUS | Status: AC
  Administered 2023-03-19: 3 mL via INTRAVENOUS

## 2023-03-19 MED ORDER — INSULIN ASPART 100 UNIT/ML IJ SOLN
0.0000 [IU] | Freq: Three times a day (TID) | INTRAMUSCULAR | Status: DC
  Administered 2023-03-19: 2 [IU] via SUBCUTANEOUS
  Administered 2023-03-20 (×2): 3 [IU] via SUBCUTANEOUS
  Administered 2023-03-20: 5 [IU] via SUBCUTANEOUS
  Filled 2023-03-19: qty 0.09

## 2023-03-19 MED ORDER — ENOXAPARIN SODIUM 40 MG/0.4ML IJ SOSY
40.0000 mg | PREFILLED_SYRINGE | INTRAMUSCULAR | Status: DC
  Administered 2023-03-19: 40 mg via SUBCUTANEOUS
  Filled 2023-03-19: qty 0.4

## 2023-03-19 MED ORDER — ACETAMINOPHEN 325 MG PO TABS: 650.0000 mg | ORAL_TABLET | ORAL | Status: DC | PRN

## 2023-03-19 MED ORDER — STROKE: EARLY STAGES OF RECOVERY BOOK
Freq: Once | Status: AC
  Filled 2023-03-19: qty 1

## 2023-03-19 MED ORDER — IOHEXOL 350 MG/ML SOLN
75.0000 mL | Freq: Once | INTRAVENOUS | Status: AC | PRN
  Administered 2023-03-19: 75 mL via INTRAVENOUS

## 2023-03-19 NOTE — Assessment & Plan Note (Addendum)
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 Start daily Plavix 75mg . -Obtain A1c and lipids -PT/OT/SLT -Frequent neuro checks and keep on telemetry -Allow for permissive hypertension with blood pressure treatment as needed only if systolic goes above [id number] -neurology to consult on arrival to [facility]

## 2023-03-19 NOTE — ED Triage Notes (Addendum)
Patient is here for evaluation of possible stroke. Reports right leg and arm weakness that started last night. Pt reports he has been having to drag his right foot to walk. Wife reports patient's speech is slurred that started in the last hour. Denies any vision changes. LKN about 12 hours ago.

## 2023-03-19 NOTE — Assessment & Plan Note (Signed)
Continue statin. 

## 2023-03-19 NOTE — ED Provider Triage Note (Signed)
Emergency Medicine Provider Triage Evaluation Note  Ethan Boyd , a 67 y.o. male  was evaluated in triage.  Pt complains of right leg and arm weakness that started last night.  He has been having to drag his right foot to walk.  Wife reports patient's speech is slurred and that began 1 hour prior to arrival in ED.  She also noticed he has facial droop.  Patient's last known normal was around 1200 yesterday, per wife.  Denies vision changes, difficulty swallowing, headache.  Patient does not take blood thinners.  Hx significant for HTN, DM, HLD.    Review of Systems  Positive: As above Negative: As above  Physical Exam  BP (!) 156/98   Pulse 95   Temp 97.9 F (36.6 C) (Oral)   Resp 18   Ht 6' (1.829 m)   Wt 78.9 kg   SpO2 100%   BMI 23.59 kg/m  Gen:   Awake, no distress   Resp:  Normal effort  MSK:   Moves extremities without difficulty  Other:  Right side facial droop without obvious slurred speech.  Grip strength weaker on right side.  No pronator drift.  4/5 strength in right side extremities.  5/5 strength in left side extremities.   EOM intact.  PERRL.   Medical Decision Making  Medically screening exam initiated at 7:20 PM.  Appropriate orders placed.  Ethan Boyd was informed that the remainder of the evaluation will be completed by another provider, this initial triage assessment does not replace that evaluation, and the importance of remaining in the ED until their evaluation is complete.  Patient outside window for Code Stroke activation.  Stroke work-up ordered.     Ethan Boyd, [place] 03/19/23 1925

## 2023-03-19 NOTE — Assessment & Plan Note (Addendum)
-  Allow for permissive hypertension with blood pressure treatment as needed only if systolic goes above [id number] -hold home amlodipine

## 2023-03-19 NOTE — H&P (Signed)
History and Physical    Patient: Ethan Boyd [id number] DOB: 05-07-1956 DOA: 03/19/2023 DOS: the patient was seen and examined on 03/19/2023 PCP: Biagio Borg, MD  Patient coming from: Home  Chief Complaint:  Chief Complaint  Patient presents with   Weakness   Aphasia   HPI: Ethan Boyd is a 67 y.o. male with medical history significant of HTN, COPD, insulin-dependent T2DM, HLD, who presents with right leg/arm weakness.   Wife noticed slight right sided facial droop yesterday afternoon. This morning it was more profound with right sided weakness. He denies any headache or changes in vision.  Patient has hx of uncontrolled Type 2 diabetes and has insulin on his medication list but states he has never started this. 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   In the ED, he was afebrile, BP of 156/98 on room air.   CBC unremarkable.   Na of 130, K of 3.7, creatinine of 1.10. CBG of 198.   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  EKG in sinus rhythm.     Review of Systems: As mentioned in the history of present illness. All other systems reviewed and are negative. Past Medical History:  Diagnosis Date   DIABETES MELLITUS, TYPE II 02/18/2009   Qualifier: Diagnosis of  By: Jenny Reichmann MD, Hunt Oris    Diverticulosis 08/31/2012   Colonoscopy, June 10, 2009, Dr Perry/GI   ERECTILE DYSFUNCTION 02/18/2009   Qualifier: Diagnosis of  By: Jenny Reichmann MD, Hunt Oris    Erectile dysfunction 09/07/2012   HYPERLIPIDEMIA 02/18/2009   Qualifier: Diagnosis of  By: Jenny Reichmann MD, Hunt Oris    HYPERTENSION 02/18/2009   Qualifier: Diagnosis of  By: Jenny Reichmann MD, Hunt Oris    Past Surgical History:  Procedure Laterality Date   COLONOSCOPY  06/10/2009   right knee cartilage  12/26/1974   Social History:  reports that he has never smoked. He has never used smokeless tobacco. He reports current alcohol use of about 8.0 standard drinks of alcohol per week. He reports that he does not use  drugs.  Allergies  Allergen Reactions   Aspirin     REACTION: angioedema    Family History  Problem Relation Age of Onset   Diabetes Other    Colon cancer Neg Hx    Colon polyps Neg Hx    Esophageal cancer Neg Hx    Rectal cancer Neg Hx    Stomach cancer Neg Hx     Prior to Admission medications   Medication Sig Start Date End Date Taking? Authorizing Provider  amLODipine (NORVASC) 5 MG tablet Take 1 tablet (5 mg total) by mouth daily. 07/29/22   Biagio Borg, MD  Continuous Blood Gluc Receiver (FREESTYLE LIBRE 2 READER) DEVI 1 Act by Does not apply route daily. Patient not taking: Reported on 01/12/2022 07/15/21   Janith Lima, MD  Continuous Blood Gluc Sensor (FREESTYLE LIBRE 2 SENSOR) MISC 1 Act by Does not apply route daily. Patient not taking: Reported on 01/12/2022 07/15/21   Janith Lima, MD  dapagliflozin propanediol (FARXIGA) 10 MG TABS tablet Take 1 tablet (10 mg total) by mouth daily before breakfast. 07/29/22   Biagio Borg, MD  indapamide (LOZOL) 1.25 MG tablet Take 1 tablet (1.25 mg total) by mouth daily. 07/29/22   Biagio Borg, MD  Insulin Glargine-Lixisenatide [facility]) 100-33 UNT-MCG/ML SOPN Inject 20 Units into the skin daily. 07/29/22   Biagio Borg, MD  Insulin Pen Needle 31G  X 6 MM MISC 1 Act by Does not apply route daily. Patient not taking: Reported on 01/12/2022 07/15/21   Janith Lima, MD  metFORMIN (GLUCOPHAGE XR) 750 MG 24 hr tablet Take 2 tablets (1,500 mg total) by mouth daily with breakfast. 07/29/22   Biagio Borg, MD  Multiple Vitamin (MULTIVITAMIN WITH MINERALS) TABS tablet Take 1 tablet by mouth daily.    [provider]  Omega-3 Fatty Acids (FISH OIL) 1200 MG CAPS Take by mouth.    [provider]  rosuvastatin (CRESTOR) 20 MG tablet Take 1 tablet (20 mg total) by mouth daily. 07/29/22   Biagio Borg, MD    Physical Exam: Vitals:   03/19/23 1815 03/19/23 1818 03/19/23 2000  BP:  (!) 156/98 (!) 159/86  Pulse:  95 93  Resp:  18  18  Temp:  97.9 F (36.6 C)   TempSrc:  Oral   SpO2:  100% 97%  Weight: 78.9 kg    Height: 6' (1.829 m)     Constitutional: NAD, calm, comfortable, elderly male laying flat in bed Eyes: lids and conjunctivae normal ENMT: Mucous membranes are moist.  Neck: normal, supple Respiratory: clear to auscultation bilaterally, no wheezing, no crackles. Normal respiratory effort. No accessory muscle use.  Cardiovascular: Regular rate and rhythm, no murmurs / rubs / gallops. No extremity edema. 2+ pedal pulses. No carotid bruits.  Abdomen: no tenderness, Bowel sounds positive.  Musculoskeletal: no clubbing / cyanosis. No joint deformity upper and lower extremities. Good ROM, no contractures. Normal muscle tone.  Skin: no rashes, lesions, ulcers. No induration Neurologic: CN 2-12 grossly intact.  Right-sided facial droop.  Symmetric and equal bilateral shoulder shrug.  Right upper extremity able to lift against gravity but not with resistance.  5 out of 5 strength of bilateral lower extremity.  Intact finger-nose.  Intact heel-to-shin. Psychiatric: Normal judgment and insight. Alert and oriented x 3.  Frustrated and irritated mood. Data Reviewed:  See HPI   Assessment and Plan: * CVA (cerebral vascular accident) ([place]) -presented with right leg/arm weakness -CT head negative -MRI brain revealing for acute infarct left pons  - obtain CTA head and neck - obtain echocardiogram  - Continue rosuvastatin already on high intensity at 20mg  -has severe allergy to aspirin. Start daily Plavix 75mg . -Obtain A1c and lipids -PT/OT/SLT -Frequent neuro checks and keep on telemetry -Allow for permissive hypertension with blood pressure treatment as needed only if systolic goes above [id number] -neurology to consult on arrival to [facility]  COPD (chronic obstructive pulmonary disease) ([place]) -stable.  Insulin dependent type 2 diabetes mellitus ([place]) -last A1C of 9.9 in 07/2022 -Hold home metformin and  Farxiga -place on sensitive SSI  Hyperlipidemia LDL goal <70 -Continue statin  Essential hypertension -Allow for permissive hypertension with blood pressure treatment as needed only if systolic goes above [id number] -hold home amlodipine      Advance Care Planning: Full  Consults: neurology Dr. Leonel Ramsay was consulted by EDP  Family Communication: WIFE at bedside  Severity of Illness: The appropriate patient status for this patient is OBSERVATION. Observation status is judged to be reasonable and necessary in order to provide the required intensity of service to ensure the patient's safety. The patient's presenting symptoms, physical exam findings, and initial radiographic and laboratory data in the context of their medical condition is felt to place them at decreased risk for further clinical deterioration. Furthermore, it is anticipated that the patient will be medically stable for discharge from the hospital within  2 midnights of admission.   Author: Orene Desanctis, DO 03/19/2023 8:51 PM  For on call review [email or web address].

## 2023-03-19 NOTE — ED Provider Notes (Signed)
[place] EMERGENCY DEPARTMENT AT [facility] Provider Note   CSN: [id number] Arrival date & time: 03/19/23  1808     History  Chief Complaint  Patient presents with   Weakness   Aphasia    Ethan Boyd is a 67 y.o. male.  67 year old male with history of type 2 diabetes as well as hypertension who presents with 1 day history of right arm weakness.  Patient also has noted some drooping to the right side of his face.  Denies any paresthesias to his face.  Denies any new right leg weakness.  Wife at the bedside states that his speech was slightly slurred but that it is resolved.  No prior history of CVA.  No visual changes.  No confusion noted       Home Medications Prior to Admission medications   Medication Sig Start Date End Date Taking? Authorizing Provider  amLODipine (NORVASC) 5 MG tablet Take 1 tablet (5 mg total) by mouth daily. 07/29/22   Biagio Borg, MD  Continuous Blood Gluc Receiver (FREESTYLE LIBRE 2 READER) DEVI 1 Act by Does not apply route daily. Patient not taking: Reported on 01/12/2022 07/15/21   Janith Lima, MD  Continuous Blood Gluc Sensor (FREESTYLE LIBRE 2 SENSOR) MISC 1 Act by Does not apply route daily. Patient not taking: Reported on 01/12/2022 07/15/21   Janith Lima, MD  dapagliflozin propanediol (FARXIGA) 10 MG TABS tablet Take 1 tablet (10 mg total) by mouth daily before breakfast. 07/29/22   Biagio Borg, MD  indapamide (LOZOL) 1.25 MG tablet Take 1 tablet (1.25 mg total) by mouth daily. 07/29/22   Biagio Borg, MD  Insulin Glargine-Lixisenatide [facility]) 100-33 UNT-MCG/ML SOPN Inject 20 Units into the skin daily. 07/29/22   Biagio Borg, MD  Insulin Pen Needle 31G X 6 MM MISC 1 Act by Does not apply route daily. Patient not taking: Reported on 01/12/2022 07/15/21   Janith Lima, MD  metFORMIN (GLUCOPHAGE XR) 750 MG 24 hr tablet Take 2 tablets (1,500 mg total) by mouth daily with breakfast. 07/29/22   Biagio Borg, MD  Multiple Vitamin  (MULTIVITAMIN WITH MINERALS) TABS tablet Take 1 tablet by mouth daily.    [provider]  Omega-3 Fatty Acids (FISH OIL) 1200 MG CAPS Take by mouth.    [provider]  rosuvastatin (CRESTOR) 20 MG tablet Take 1 tablet (20 mg total) by mouth daily. 07/29/22   Biagio Borg, MD      Allergies    Aspirin    Review of Systems   Review of Systems  All other systems reviewed and are negative.   Physical Exam Updated Vital Signs BP (!) 156/98   Pulse 95   Temp 97.9 F (36.6 C) (Oral)   Resp 18   Ht 1.829 m (6')   Wt 78.9 kg   SpO2 100%   BMI 23.59 kg/m  Physical Exam Vitals and nursing note reviewed.  Constitutional:      General: He is not in acute distress.    Appearance: Normal appearance. He is well-developed. He is not toxic-appearing.  HENT:     Head: Normocephalic and atraumatic.  Eyes:     General: Lids are normal.     Conjunctiva/sclera: Conjunctivae normal.     Pupils: Pupils are equal, round, and reactive to light.  Neck:     Thyroid: No thyroid mass.     Trachea: No tracheal deviation.  Cardiovascular:     Rate  and Rhythm: Normal rate and regular rhythm.     Heart sounds: Normal heart sounds. No murmur heard.    No gallop.  Pulmonary:     Effort: Pulmonary effort is normal. No respiratory distress.     Breath sounds: Normal breath sounds. No stridor. No decreased breath sounds, wheezing, rhonchi or rales.  Abdominal:     General: There is no distension.     Palpations: Abdomen is soft.     Tenderness: There is no abdominal tenderness. There is no rebound.  Musculoskeletal:        General: No tenderness. Normal range of motion.     Cervical back: Normal range of motion and neck supple.  Skin:    General: Skin is warm and dry.     Findings: No abrasion or rash.  Neurological:     Mental Status: He is alert and oriented to person, place, and time. Mental status is at baseline.     GCS: GCS eye subscore is 4. GCS verbal subscore is 5. GCS  motor subscore is 6.     Cranial Nerves: Cranial nerves 2-12 are intact.     Sensory: No sensory deficit.     Motor: Motor function is intact. No tremor.     Gait: Gait is intact.     Comments: Right-sided facial droop noted.  Right upper and lower extremity strength 4 of 5.  Left-sided strength normal.  No drift appreciated  Psychiatric:        Attention and Perception: Attention normal.        Speech: Speech normal.        Behavior: Behavior normal.     ED Results / Procedures / Treatments   Labs (all labs ordered are listed, but only abnormal results are displayed) Labs Reviewed  CBG MONITORING, ED - Abnormal; Notable for the following components:      Result Value   Glucose-Capillary 246 (*)    All other components within normal limits  PROTIME-INR  APTT  CBC  DIFFERENTIAL  COMPREHENSIVE METABOLIC PANEL  ETHANOL  I-STAT CHEM 8, ED    EKG EKG Interpretation  Date/Time:  Sunday March 19 2023 18:22:56 EDT Ventricular Rate:  97 PR Interval:  165 QRS Duration: 105 QT Interval:  318 QTC Calculation: 404 R Axis:   61 Text Interpretation: Sinus rhythm Borderline repolarization abnormality Confirmed by Lacretia Leigh ([id number]) on 03/19/2023 7:19:11 PM  Radiology CT HEAD WO CONTRAST  Result Date: 03/19/2023 CLINICAL DATA:  Neuro deficit, acute, stroke suspected LAST KNOWN NORMAL YESTERDAY 1200 EXAM: CT HEAD WITHOUT CONTRAST TECHNIQUE: Contiguous axial images were obtained from the base of the skull through the vertex without intravenous contrast. RADIATION DOSE REDUCTION: This exam was performed according to the departmental dose-optimization program which includes automated exposure control, adjustment of the mA and/or kV according to patient size and/or use of iterative reconstruction technique. COMPARISON:  None Available. FINDINGS: Brain: No evidence of large-territorial acute infarction. No parenchymal hemorrhage. No mass lesion. No extra-axial collection. No mass effect or  midline shift. No hydrocephalus. Basilar cisterns are patent. Vascular: No hyperdense vessel. Atherosclerotic calcifications are present within the cavernous internal carotid arteries. Skull: No acute fracture or focal lesion. Sinuses/Orbits: Paranasal sinuses and mastoid air cells are clear. The orbits are unremarkable. Other: None. IMPRESSION: No acute intracranial abnormality. Electronically Signed   By: Iven Finn M.D.   On: 03/19/2023 19:14    Procedures Procedures    Medications Ordered in ED Medications  sodium chloride flush (NS)  0.9 % injection 3 mL (has no administration in time range)    ED Course/ Medical Decision Making/ A&P                             Medical Decision Making  Patient is EKG per interpretation shows normal sinus rhythm.  No signs of acute ischemic changes noted.  Patient's head CT from interpretation showed no acute findings.  Patient subsequently had MRI which does show an acute infarct in the left pons.  Discussed with Dr. Leonel Ramsay from neurology wants patient sent to [facility].  Will consult hospitalist team here.  Patient's wife at bedside and she has been notified along with the patient        Final Clinical Impression(s) / ED Diagnoses Final diagnoses:  None    Rx / DC Orders ED Discharge Orders     None         Lacretia Leigh, MD 03/19/23 1952

## 2023-03-19 NOTE — Assessment & Plan Note (Signed)
stable °

## 2023-03-19 NOTE — ED Notes (Addendum)
MRI notified to bring patient to exam 2 when they are finished with scan.

## 2023-03-19 NOTE — Assessment & Plan Note (Signed)
-  last A1C of 9.9 in 07/2022 -Hold home metformin and Farxiga -place on sensitive SSI

## 2023-03-20 ENCOUNTER — Observation Stay (HOSPITAL_BASED_OUTPATIENT_CLINIC_OR_DEPARTMENT_OTHER): Payer: Medicare Other

## 2023-03-20 DIAGNOSIS — I6381 Other cerebral infarction due to occlusion or stenosis of small artery: Secondary | ICD-10-CM | POA: Diagnosis not present

## 2023-03-20 DIAGNOSIS — I6389 Other cerebral infarction: Secondary | ICD-10-CM | POA: Diagnosis not present

## 2023-03-20 DIAGNOSIS — I639 Cerebral infarction, unspecified: Secondary | ICD-10-CM | POA: Diagnosis not present

## 2023-03-20 LAB — GLUCOSE, CAPILLARY
Glucose-Capillary: 281 mg/dL — ABNORMAL HIGH (ref 70–99)
Glucose-Capillary: 202 mg/dL — ABNORMAL HIGH (ref 70–99)
Glucose-Capillary: 248 mg/dL — ABNORMAL HIGH (ref 70–99)

## 2023-03-20 LAB — ECHOCARDIOGRAM COMPLETE
AR max vel: 2.49 cm2
AV Area VTI: 2.26 cm2
AV Area mean vel: 2.37 cm2
AV Mean grad: 4 mmHg
AV Peak grad: 7.3 mmHg
Ao pk vel: 1.35 m/s
Area-P 1/2: 7.16 cm2
Height: 72 in
S' Lateral: 3.4 cm
Weight: 2783.09 oz

## 2023-03-20 LAB — RAPID URINE DRUG SCREEN, HOSP PERFORMED
Amphetamines: NOT DETECTED
Barbiturates: NOT DETECTED
Benzodiazepines: NOT DETECTED
Cocaine: NOT DETECTED
Opiates: NOT DETECTED
Tetrahydrocannabinol: NOT DETECTED

## 2023-03-20 MED ORDER — CLOPIDOGREL BISULFATE 75 MG PO TABS: 75.0000 mg | ORAL_TABLET | Freq: Every day | ORAL | 0 refills | Status: DC

## 2023-03-20 MED ORDER — ROSUVASTATIN CALCIUM 20 MG PO TABS: 40.0000 mg | ORAL_TABLET | Freq: Every day | ORAL | Status: DC

## 2023-03-20 MED ORDER — LANCETS MISC: 1.0000 | Freq: Three times a day (TID) | 0 refills | Status: AC

## 2023-03-20 MED ORDER — LIVING WELL WITH DIABETES BOOK
Freq: Once | Status: AC
  Filled 2023-03-20: qty 1

## 2023-03-20 MED ORDER — METFORMIN HCL ER 750 MG PO TB24: 1500.0000 mg | ORAL_TABLET | Freq: Every day | ORAL | 0 refills | Status: DC

## 2023-03-20 MED ORDER — STUDY - OCEANIC-STROKE - ASUNDEXIAN 50 MG OR PLACEBO TABLET (PI-SETHI): 1.0000 | ORAL_TABLET | Freq: Every day | ORAL | 0 refills | Status: DC

## 2023-03-20 MED ORDER — AMLODIPINE BESYLATE 5 MG PO TABS: 5.0000 mg | ORAL_TABLET | Freq: Every day | ORAL | 3 refills | Status: DC

## 2023-03-20 MED ORDER — ROSUVASTATIN CALCIUM 40 MG PO TABS: 40.0000 mg | ORAL_TABLET | Freq: Every day | ORAL | 0 refills | Status: DC

## 2023-03-20 MED ORDER — INSULIN PEN NEEDLE 31G X 6 MM MISC: 1.0000 | Freq: Every day | 1 refills | Status: DC

## 2023-03-20 MED ORDER — INSULIN GLARGINE 100 UNIT/ML SOLOSTAR PEN: 8.0000 [IU] | PEN_INJECTOR | Freq: Every day | SUBCUTANEOUS | 0 refills | Status: AC

## 2023-03-20 MED ORDER — CLOPIDOGREL BISULFATE 75 MG PO TABS
225.0000 mg | ORAL_TABLET | Freq: Once | ORAL | Status: AC
  Administered 2023-03-20: 225 mg via ORAL
  Filled 2023-03-20: qty 3

## 2023-03-20 MED ORDER — BLOOD GLUCOSE MONITORING SUPPL DEVI: 1.0000 | Freq: Three times a day (TID) | 0 refills | Status: AC

## 2023-03-20 MED ORDER — DAPAGLIFLOZIN PROPANEDIOL 10 MG PO TABS: 10.0000 mg | ORAL_TABLET | Freq: Every day | ORAL | 0 refills | Status: DC

## 2023-03-20 MED ORDER — BLOOD GLUCOSE TEST VI STRP: 1.0000 | ORAL_STRIP | Freq: Three times a day (TID) | 0 refills | Status: DC

## 2023-03-20 MED ORDER — LANCET DEVICE MISC: 1.0000 | Freq: Three times a day (TID) | 0 refills | Status: AC

## 2023-03-20 MED ORDER — PEN NEEDLES 31G X 5 MM MISC: 1.0000 | Freq: Three times a day (TID) | 0 refills | Status: AC

## 2023-03-20 MED ORDER — STUDY - OCEANIC-STROKE - ASUNDEXIAN 50 MG OR PLACEBO TABLET (PI-SETHI)
1.0000 | ORAL_TABLET | Freq: Every day | ORAL | Status: DC
  Administered 2023-03-20: 50 mg via ORAL
  Filled 2023-03-20: qty 1

## 2023-03-20 NOTE — H&P (Signed)
OCEANIC STROKE  RESEARCH STUDY NOTE   Patient is participating in [place] stroke prevention study ( standard of care antiplatelet therapy plus Asundexian-factor 11 inhibitor versus standard of care antiplatelet therapy plus placebo ).  Patient and his wife were given study material to review and informed consent form at 11:50 AM.  Patient was advised to take as much time as he wanted to review the consent form and encouraged to ask questions which were answered.  Patient's son and husband were also present at the bedside and were also encouraged to review the material and ask questions.  It was made clear to the patient that study participation is voluntary and patient will still get the same excellent standard of care irrespective of whether she chooses to participate in the study or not.  The benefits of participation in the study as well as the risk involved including but not limited to bleeding as well as alternatives to not participating in the study were clearly discussed with the patient and family who expressed understanding.  The patient was clearly informed that patient has no obligation to's stay in the study for the entire duration and is free to discontinue study medication or study participation if she is dissatisfied at any time in the future.  The research study office visit scheduled was explained to the patient and study obligations were clearly explained as well.  Patient voiced understanding and willingness to participate in the study.  The study inclusion exclusion criteria reviewed by me personally as well as study coordinator and verified that patient met all criteria.  Patient signed informed consent with study coordinator in my presence  at 109 PM.  The hospital research pharmacy was notified in advance about potential patient participation and after patient randomization and patient received first dose of medication before discharge and she was discharged home with the study medication  bottle and instructed to call [place] research office with any questions.  No study specific procedure was done prior to patient signing informed consent form.  A copy of informed consent form and patient [place] [employer] of Rights signed by the patient was given to her.  Antony Contras, MD

## 2023-03-20 NOTE — Progress Notes (Signed)
PT Cancellation Note  Patient Details Name: Ethan Boyd MRN: [id number] DOB: [birth year]   Cancelled Treatment:    Reason Eval/Treat Not Completed: PT screened, no needs identified, will sign off (evaluated by OT with no needs identified; independent with mobility).  Wyona Almas, PT, DPT Acute Rehabilitation Services Office [place] 03/20/2023, 11:09 AM

## 2023-03-20 NOTE — Evaluation (Signed)
 Occupational Therapy Evaluation/Discharge Patient Details Name: Ethan Boyd MRN: [id number] DOB: 1956-07-08 Today's Date: 03/20/2023   History of Present Illness Pt is a 67 y/o male presenting with R sided weakness, slurred speech and facial droop. MRI brain showed acute infarct of L pons. PMH: DM2, diverticulitis, HTN, HLD.   Clinical Impression   PTA, pt lives with spouse and typically completely Independent in all daily tasks, active at baseline. Pt able to demo ADLs, hallway mobility and stair mgmt Independent without safety concerns. BUE/LE strength and sensation WFL. Coordination functional and improving. Pt with baseline R knee deficits though denies hindrance during daily routine. No further skilled therapy needs indicated at this time. Pt functionally appropriate for DC home once medically cleared.      Recommendations for follow up therapy are one component of a multi-disciplinary discharge planning process, led by the attending physician.  Recommendations may be updated based on patient status, additional functional criteria and insurance authorization.   Assistance Recommended at Discharge None  Patient can return home with the following      Functional Status Assessment  Patient has not had a recent decline in their functional status  Equipment Recommendations  None recommended by OT    Recommendations for Other Services       Precautions / Restrictions Precautions Precautions: None Restrictions Weight Bearing Restrictions: No      Mobility Bed Mobility Overal bed mobility: Modified Independent                  Transfers Overall transfer level: Independent Equipment used: None                      Balance Overall balance assessment: Modified Independent                                         ADL either performed or assessed with clinical judgement   ADL Overall ADL's : Independent                                        General ADL Comments: able to don tennis shoes, mobilize around unit, complete dynamic head turning without LOB, manage stair set in gym without issue. denies concerns     Vision Ability to See in Adequate Light: 0 Adequate Patient Visual Report: No change from baseline Vision Assessment?: No apparent visual deficits     Perception     Praxis      Pertinent Vitals/Pain Pain Assessment Pain Assessment: No/denies pain     Hand Dominance Right   Extremity/Trunk Assessment Upper Extremity Assessment Upper Extremity Assessment: Overall WFL for tasks assessed   Lower Extremity Assessment Lower Extremity Assessment: Overall WFL for tasks assessed (baseline R knee deficits since 1970s)   Cervical / Trunk Assessment Cervical / Trunk Assessment: Normal   Communication Communication Communication: No difficulties   Cognition Arousal/Alertness: Awake/alert Behavior During Therapy: WFL for tasks assessed/performed Overall Cognitive Status: Within Functional Limits for tasks assessed                                       General Comments       Exercises     Shoulder Instructions  Home Living Family/patient expects to be discharged to:: Private residence Living Arrangements: Spouse/significant other Available Help at Discharge: Family Type of Home: House Home Access: Stairs to enter Technical  of Steps: 4-5 Entrance Stairs-Rails: Right;Left Home Layout: Two level Alternate Level Stairs-Number of Steps: flight Alternate Level Stairs-Rails: Right;Left Bathroom Shower/Tub: Occupational psychologist: Standard     Home Equipment: Conservation officer, nature (2 wheels);Cane - single point;Crutches;Wheelchair - manual (DME from pt's late mother in law)      Lives With: Spouse    Prior Functioning/Environment Prior Level of Function : Independent/Modified Independent;Driving               ADLs Comments: Works in the  yard, driving, shopping, retired Futures trader.        OT Problem List:        OT Treatment/Interventions:      OT Goals(Current goals can be found in the care plan section) Acute Rehab OT Goals Patient Stated Goal: home today OT Goal Formulation: All assessment and education complete, DC therapy  OT Frequency:      Co-evaluation              AM-PAC OT "6 Clicks" Daily Activity     Outcome Measure Help from another person eating meals?: None Help from another person taking care of personal grooming?: None Help from another person toileting, which includes using toliet, bedpan, or urinal?: None Help from another person bathing (including washing, rinsing, drying)?: None Help from another person to put on and taking off regular upper body clothing?: None Help from another person to put on and taking off regular lower body clothing?: None 6 Click Score: 24   End of Session Nurse Communication: Mobility status  Activity Tolerance: Patient tolerated treatment well Patient left: in bed;with call bell/phone within reach;with family/visitor present  OT Visit Diagnosis: Other abnormalities of gait and mobility (R26.89)                Time: [id number] OT Time Calculation (min): 17 min Charges:  OT General Charges $OT Visit: 1 Visit OT Evaluation $OT Eval Low Complexity: 1 Low  Malachy Chamber, OTR/L Acute Rehab Services Office: [phone]   Layla Maw 03/20/2023, 11:17 AM

## 2023-03-20 NOTE — Consult Note (Signed)
Neurology Consultation Reason for Consult: Stroke Referring Physician: Zenia Resides, a  CC: Right arm weakness  History is obtained from: Patient  HPI: Ethan Boyd is a 67 y.o. male who noticed yesterday afternoon, he states sometime mid afternoon possibly around three, he started having some right-sided weakness.  Since that time, it is maybe gotten slightly worse, his wife notes that he has been slurring his speech slightly today.  Due to these problems he sought care at [facility] emergency department where an MRI was obtained demonstrating a pontine stroke.  Of note, he reports an aspirin allergy.  LKW: Noon 3/23 tnk given?: no, outside of window   Past Medical History:  Diagnosis Date   DIABETES MELLITUS, TYPE II 02/18/2009   Qualifier: Diagnosis of  By: Jenny Reichmann MD, Hunt Oris    Diverticulosis 08/31/2012   Colonoscopy, June 10, 2009, Dr Perry/GI   ERECTILE DYSFUNCTION 02/18/2009   Qualifier: Diagnosis of  By: Jenny Reichmann MD, Hunt Oris    Erectile dysfunction 09/07/2012   HYPERLIPIDEMIA 02/18/2009   Qualifier: Diagnosis of  By: Jenny Reichmann MD, Hunt Oris    HYPERTENSION 02/18/2009   Qualifier: Diagnosis of  By: Jenny Reichmann MD, Hunt Oris      Family History  Problem Relation Age of Onset   Diabetes Other    Colon cancer Neg Hx    Colon polyps Neg Hx    Esophageal cancer Neg Hx    Rectal cancer Neg Hx    Stomach cancer Neg Hx      Social History:  reports that he has never smoked. He has never used smokeless tobacco. He reports current alcohol use of about 8.0 standard drinks of alcohol per week. He reports that he does not use drugs.   Exam: Current vital signs: BP (!) 148/80 (BP Location: Right Arm)   Pulse 99   Temp 97.9 F (36.6 C) (Oral)   Resp 14   Ht 6' (1.829 m)   Wt 78.9 kg   SpO2 98%   BMI 23.59 kg/m  Vital signs in last 24 hours: Temp:  [97.9 F (36.6 C)-98.3 F (36.8 C)] 97.9 F (36.6 C) (03/24 2329) Pulse Rate:  [93-102] 99 (03/24 2329) Resp:  [12-18] 14 (03/24 2329) BP:  (148-171)/(80-98) 148/80 (03/24 2329) SpO2:  [95 %-100 %] 98 % (03/24 2329) Weight:  [78.9 kg] 78.9 kg (03/24 1815)   Physical Exam  Appears well-developed and well-nourished.   Neuro: Mental Status: Patient is awake, alert, oriented to person, place, month, year, and situation. Patient is able to give a clear and coherent history. No signs of aphasia or neglect Cranial Nerves: II: Visual Fields are full. Pupils are equal, round, and reactive to light.   III,IV, VI: EOMI without ptosis or diploplia.  V: Facial sensation is symmetric to temperature VII: Facial movement with right-sided weakness VIII: hearing is intact to voice X: Uvula elevates symmetrically XI: Shoulder shrug is symmetric. XII: tongue is midline without atrophy or fasciculations.  Motor: Tone is normal. Bulk is normal. 5/5 strength was present on the left side, on the right he has 4/5 strength of the right arm and leg Sensory: Sensation is symmetric to light touch and temperature in the arms and legs. 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  My suspicion is  this represents small vessel disease, though with his posterior circulation disease, difficult to be certain.  He will need physical therapy as well as stroke risk factor modification.  Recommendations: - HgbA1c, fasting lipid panel - Frequent neuro checks - Echocardiogram - Prophylactic therapy-Antiplatelet med: plavix 75mg  daily  after 300mg  load  - Risk factor modification - Telemetry monitoring - PT consult, OT consult, Speech consult - Stroke team to follow    Roland Rack, MD [facility] [phone]  If 7pm- 7am, please page neurology on call as listed in [place].

## 2023-03-20 NOTE — Progress Notes (Addendum)
Patient ready for discharge to home; 3 units of novolog insulin injection with SQ needle performed by patient with teach back successfully; see MAR per SSI orders;; he will receive the Lantus insulin pen from his pharmacy and instructed to receive education and instruction on dosage delivery from his pharmacy;patient already using glucose meter to test CBG. Discharge instructions given and reviewed; Rx's sent electronically; patient education booklets given; patient dressed and ready for discharge; discharged out via wheelchair accompanied home by his wife.

## 2023-03-20 NOTE — Evaluation (Signed)
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   Type II diabetes mellitus with manifestations ([place]) 07/15/2021   Hyperlipidemia LDL goal <70 07/15/2021   Tachycardia 07/15/2021   PVC (premature ventricular contraction) 07/15/2021   Insulin dependent type 2 diabetes mellitus ([place]) 07/15/2021   Lung nodule 07/15/2021   EKG abnormality 09/07/2012   Erectile dysfunction 09/07/2012   Snoring 09/07/2012   Encounter for well adult exam with abnormal findings 08/31/2012   Diverticulosis 08/31/2012   Essential hypertension 02/18/2009   Past Medical History:  Past Medical History:  Diagnosis Date   DIABETES MELLITUS, TYPE II 02/18/2009   Qualifier: Diagnosis of  By: Jenny Reichmann MD, Hunt Oris    Diverticulosis 08/31/2012   Colonoscopy, June 10, 2009, Dr Perry/GI   ERECTILE DYSFUNCTION 02/18/2009   Qualifier: Diagnosis of  By: Jenny Reichmann MD, Hunt Oris    Erectile dysfunction 09/07/2012   HYPERLIPIDEMIA 02/18/2009   Qualifier: Diagnosis of  By: Jenny Reichmann MD, Hunt Oris    HYPERTENSION 02/18/2009   Qualifier: Diagnosis of  By: Jenny Reichmann MD, Hunt Oris    Past Surgical History:  Past Surgical History:  Procedure Laterality Date   COLONOSCOPY  06/10/2009   right knee cartilage  12/26/1974   HPI:  Ethan Boyd is a 67 y.o. male with medical history significant of HTN, COPD, insulin-dependent T2DM, HLD, who presents with right leg/arm weakness.      Wife noticed slight right sided facial droop yesterday  afternoon. This morning it was more profound with right sided weakness. He denies any headache or changes in vision.   Patient has hx of uncontrolled Type 2 diabetes and has insulin on his medication list but states he has never started this. 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   MRI on 03/19/23 revealed Acute infarct in the left pons.  ST consulted for speechllanguage cognitive assessment.   Assessment / Plan / Recommendation Clinical Impression  Pt assessed via the [place] Mental Status Examination (SLUMS) with a score obtained of 29/30 with at typical score on this assessment being 27/30, so score was within normal limits for this assessment.  OME revealed slight R facial asymmetry at rest only, but functional for swallowing/speech purposes.  Pt's speech was fully intelligible during complex conversation, but his wife stated this has improved significantly from yesterday.  Fatigue factor may be taken into consideration regarding potential for dysarthria.  Pt recalled 4/5 objects on SLUMS without category cue, but recalled all 5 with this assistance.  Graphic expression legible and accurate despite dominant hand weakness denoted by pt.  Otherwise, all tasks on assessment WFL.  Ox4 and insight/problem solving all accurate with functional tasks.  No reported dysphagia and Yale swallow screen passed per chart review.  No acute ST recommendations, but dysarthria may be addressed at next venue of care prn if fatigue interferes with intelligibility.  Thank you for this consult.    SLP Assessment  SLP Recommendation/Assessment: All further Speech Language Pathology  needs can be addressed in the next venue of care (if pt desires to rehabilitate R  facial droop or speech becomes dysarthric when fatigued) SLP Visit Diagnosis: Dysarthria and anarthria (R47.1)    Recommendations for follow up therapy are one component of a multi-disciplinary discharge planning process, led by the attending physician.   Recommendations may be updated based on patient status, additional functional criteria and insurance authorization.    Follow Up Recommendations  Follow physician's recommendations for discharge plan and follow up therapies    Assistance Recommended at Discharge  Intermittent Supervision/Assistance  Functional Status Assessment Patient has had a recent decline in their functional status and demonstrates the ability to make significant improvements in function in a reasonable and predictable amount of time.  Frequency and Duration Other (Comment) (Evaluation only)         SLP Evaluation Cognition  Overall Cognitive Status: Within Functional Limits for tasks assessed Arousal/Alertness: Awake/alert Orientation Level: Oriented X4 Year: 2024 Month: March Day of Week: Correct Attention: Sustained Sustained Attention: Appears intact Memory: Appears intact Awareness: Appears intact Problem Solving: Appears intact Safety/Judgment: Appears intact       Comprehension  Auditory Comprehension Overall Auditory Comprehension: Appears within functional limits for tasks assessed Yes/No Questions: Within Functional Limits Commands: Within Functional Limits Conversation: Complex Visual Recognition/Discrimination Discrimination: Within Function Limits Reading Comprehension Reading Status: Within funtional limits    Expression Expression Primary Mode of Expression: Verbal Verbal Expression Overall Verbal Expression: Appears within functional limits for tasks assessed Initiation: No impairment Level of Generative/Spontaneous Verbalization: Conversation Repetition: No impairment Naming: No impairment Pragmatics: No impairment Non-Verbal Means of Communication: Not applicable Written Expression Dominant Hand: Right Written Expression: Within Functional Limits   Oral / Motor  Oral Motor/Sensory Function Overall Oral Motor/Sensory Function: Mild impairment Facial Symmetry: Abnormal  symmetry right Facial Strength: Within Functional Limits Facial Sensation: Within Functional Limits Lingual ROM: Within Functional Limits Lingual Symmetry: Within Functional Limits Lingual Strength: Within Functional Limits Lingual Sensation: Within Functional Limits Velum: Within Functional Limits Mandible: Within Functional Limits Motor Speech Overall Motor Speech: Appears within functional limits for tasks assessed Respiration: Within functional limits Phonation: Normal Resonance: Within functional limits Articulation: Within functional limitis Intelligibility: Intelligible (wife states this has improved significantly since yesterday) Motor Planning: Witnin functional limits Motor Speech Errors: Not applicable            Pat Toinette Lackie,M.S., CCC-SLP 03/20/2023, 11:01 AM

## 2023-03-20 NOTE — Progress Notes (Addendum)
[facility] New Study Start: OCEANIC-STROKE   SUMMARY For more information refer to: [email or web address]. Study Identifier: [id number]    A Multicenter, International, Randomized, Placebo Controlled, Double-blind, Parallel Group and Event Driven Phase 3 Study of the Oral FXIa Inhibitor Asundexian ([place] [id number]) for the Prevention of Ischemic Stroke in Male and Male Participants Aged 67 Years and Older After an Acute Non-cardioembolic Ischemic Stroke or High-risk TIA  Brief Summary This study will randomize adult patients with an initial diagnosis of acute non-cardioembolic ischemic stroke or high-risk transient ischemic attack (TIA) to treatment within 72 hours of symptom onset and with the intention to be treated with antiplatelet therapy.  Design Phase 3, Randomized, Interventional, Event-Driven  Intervention Asundexian [id number]) 50 mg tablets or matching placebo tablets (pink, oval, film-coated tablets  Concomitant Therapy Participants are to receive antiplatelet therapy (single or dual; provider discretion) during the study conduct.  Prohibited Therapy Oral anticoagulation Full dose and/or long-term anticoagulation therapy with heparin or LMWH  Chronic (more than 4 weeks continuous) therapy with NSAIDs during the study conduct Concomitant use of combined P-gp and strong/moderate CYP3A4 inducers Concomitant use of combined P-gp and strong CYP3A4 inhibitors Herbal/traditional medications and/or supplements with known anticoagulant/antiplatelet effects  Anticoagulation Prophylaxis Venous thromboembolism prophylaxis with LMWH or unfractionated heparin for short periods of time (2 weeks) is allowed.  Potential Drug-Drug Interactions Rosuvastatin 20 mg daily or higher or atorvastatin 80 mg (additional monitoring may be warranted due to increased concentrations of rosuvastatin and atorvastatin - asundexian [id number]) is a weak inhibitor of CYP2C8)  Administration   Can be taken irrespective of food intake. Should be swallowed whole with water, preferably in the morning. CANNOT be crushed or broken.      Plan: Start Cecille Rubin [id number]) 50 mg tablets or placebo] today. Study medication must be picked up from pharmacy, medication can not be tubed.    Please contact IDS if any questions or concerns regarding the study medication.   Please ensure study medication is picked up from inpatient main pharmacy prior to discharge.    Acey Lav, PharmD, [facility] Pharmacist  [phone]

## 2023-03-20 NOTE — Progress Notes (Addendum)
STROKE TEAM PROGRESS NOTE   INTERVAL HISTORY His wife is at the bedside.  Patient alert, oriented, able to give history.  Still exhibits some right facial droop and decreased fine motor skills in the right hand.  PT, OT and speech have seen the patient and no follow-up is needed. Stroke workup being completed, hemoglobin A1c still pending.  However this can be followed up with his PCP visit.   Patient is okay to be discharged from neurostandpoint.  Due to his allergy to aspirin he will need to be only on Plavix.  Patient is interested in participating in the [facility] stroke trial(standard antiplatelet therapy plus minus factor XI inhibitor Asundexian ).  He was given information to review and decide  Vitals:   03/19/23 2329 03/20/23 0341 03/20/23 0720 03/20/23 1118  BP: (!) 148/80 (!) 165/101 (!) 153/89 (!) 145/88  Pulse: 99 93 92 89  Resp: 14 12 15 16   Temp: 97.9 F (36.6 C) 98.3 F (36.8 C) 98.4 F (36.9 C) 98.1 F (36.7 C)  TempSrc: Oral  Oral Oral  SpO2: 98% 97% 96% 95%  Weight:      Height:       CBC:  Recent Labs  Lab 03/19/23 1930 03/19/23 1937  WBC 8.4  --   NEUTROABS 5.0  --   HGB 15.8 17.0  HCT 44.9 50.0  MCV 92.2  --   PLT 214  --    Basic Metabolic Panel:  Recent Labs  Lab 03/19/23 1930 03/19/23 1937  NA 129* 130*  K 3.6 3.7  CL 90* 90*  CO2 21*  --   GLUCOSE 205* 198*  BUN 9 7*  CREATININE 0.81 1.10  CALCIUM 9.3  --    Lipid Panel:  Recent Labs  Lab 03/19/23 2220  CHOL 254*  TRIG 200*  HDL 82  CHOLHDL 3.1  VLDL 40  LDLCALC 132*   HgbA1c: No results for input(s): "HGBA1C" in the last 168 hours. Urine Drug Screen:  Recent Labs  Lab 03/20/23 1211  LABOPIA NONE DETECTED  COCAINSCRNUR NONE DETECTED  LABBENZ NONE DETECTED  AMPHETMU NONE DETECTED  THCU NONE DETECTED  LABBARB NONE DETECTED    Alcohol Level No results for input(s): "ETH" in the last 168 hours.  IMAGING past 24 hours ECHOCARDIOGRAM COMPLETE  Result Date: 03/20/2023     ECHOCARDIOGRAM REPORT   Patient Name:   Ethan Boyd Date of Exam: 03/20/2023 Medical Rec #:  [id number]     Height:       72.0 in Accession #:    [id number]    Weight:       173.9 lb Date of Birth:  09-Mar-1956    BSA:          2.008 m Patient Age:    67 years      BP:           153/89 mmHg Patient Gender: M             HR:           99 bpm. Exam Location:  Inpatient Procedure: 2D Echo, Cardiac Doppler and Color Doppler Indications:    Stroke  History:        Patient has prior history of Echocardiogram examinations. COPD;                 Risk Factors:Hypertension and Dyslipidemia.  Sonographer:    Danne Baxter RDCS, FE, PE Referring Phys: [id number] [place] T TU IMPRESSIONS  1. Left ventricular ejection  fraction, by estimation, is 55 to 60%. The left ventricle has normal function. The left ventricle has no regional wall motion abnormalities. Left ventricular diastolic parameters are consistent with Grade I diastolic dysfunction (impaired relaxation).  2. Right ventricular systolic function is normal. The right ventricular size is normal.  3. The mitral valve is normal in structure. Trivial mitral valve regurgitation. No evidence of mitral stenosis.  4. The aortic valve is tricuspid. There is mild calcification of the aortic valve. Aortic valve regurgitation is not visualized. Aortic valve sclerosis/calcification is present, without any evidence of aortic stenosis. Aortic valve area, by VTI measures  2.26 cm. Aortic valve mean gradient measures 4.0 mmHg. Aortic valve Vmax measures 1.35 m/s.  5. The inferior vena cava is normal in size with greater than 50% respiratory variability, suggesting right atrial pressure of 3 mmHg. FINDINGS  Left Ventricle: Left ventricular ejection fraction, by estimation, is 55 to 60%. The left ventricle has normal function. The left ventricle has no regional wall motion abnormalities. The left ventricular internal cavity size was normal in size. There is  no left ventricular hypertrophy.  Left ventricular diastolic parameters are consistent with Grade I diastolic dysfunction (impaired relaxation). Right Ventricle: The right ventricular size is normal. No increase in right ventricular wall thickness. Right ventricular systolic function is normal. Left Atrium: Left atrial size was normal in size. Right Atrium: Right atrial size was normal in size. Pericardium: There is no evidence of pericardial effusion. Mitral Valve: The mitral valve is normal in structure. Trivial mitral valve regurgitation. No evidence of mitral valve stenosis. Tricuspid Valve: The tricuspid valve is normal in structure. Tricuspid valve regurgitation is trivial. No evidence of tricuspid stenosis. Aortic Valve: The aortic valve is tricuspid. There is mild calcification of the aortic valve. Aortic valve regurgitation is not visualized. Aortic valve sclerosis/calcification is present, without any evidence of aortic stenosis. Aortic valve mean gradient measures 4.0 mmHg. Aortic valve peak gradient measures 7.3 mmHg. Aortic valve area, by VTI measures 2.26 cm. Pulmonic Valve: The pulmonic valve was normal in structure. Pulmonic valve regurgitation is not visualized. No evidence of pulmonic stenosis. Aorta: The aortic root is normal in size and structure. Venous: The inferior vena cava is normal in size with greater than 50% respiratory variability, suggesting right atrial pressure of 3 mmHg. IAS/Shunts: No atrial level shunt detected by color flow Doppler.  LEFT VENTRICLE PLAX 2D LVIDd:         4.90 cm   Diastology LVIDs:         3.40 cm   LV e' medial:    6.31 cm/s LV PW:         1.00 cm   LV E/e' medial:  6.0 LV IVS:        0.90 cm   LV e' lateral:   7.51 cm/s LVOT diam:     2.10 cm   LV E/e' lateral: 5.0 LV SV:         52 LV SV Index:   26 LVOT Area:     3.46 cm  RIGHT VENTRICLE RV S prime:     13.50 cm/s TAPSE (M-mode): 1.6 cm LEFT ATRIUM             Index        RIGHT ATRIUM           Index LA diam:        3.20 cm 1.59 cm/m   RA  Area:     14.40 cm LA Vol (  A2C):   66.6 ml 33.17 ml/m  RA Volume:   34.40 ml  17.13 ml/m LA Vol (A4C):   45.6 ml 22.71 ml/m LA Biplane Vol: 56.0 ml 27.89 ml/m  AORTIC VALVE AV Area (Vmax):    2.49 cm AV Area (Vmean):   2.37 cm AV Area (VTI):     2.26 cm AV Vmax:           135.00 cm/s AV Vmean:          94.400 cm/s AV VTI:            0.231 m AV Peak Grad:      7.3 mmHg AV Mean Grad:      4.0 mmHg LVOT Vmax:         96.90 cm/s LVOT Vmean:        64.500 cm/s LVOT VTI:          0.151 m LVOT/AV VTI ratio: 0.65  AORTA Ao Root diam: 3.40 cm MITRAL VALVE               TRICUSPID VALVE MV Area (PHT): 7.16 cm    TR Peak grad:   7.0 mmHg MV Decel Time: 106 msec    TR Vmax:        132.00 cm/s MV E velocity: 37.70 cm/s MV A velocity: 79.70 cm/s  SHUNTS MV E/A ratio:  0.47        Systemic VTI:  0.15 m                            Systemic Diam: 2.10 cm Glori Bickers MD Electronically signed by Glori Bickers MD Signature Date/Time: 03/20/2023/12:32:59 PM    Final    CT ANGIO HEAD NECK W WO CM  Result Date: 03/19/2023 CLINICAL DATA:  Initial evaluation for neuro deficit, stroke. EXAM: CT ANGIOGRAPHY HEAD AND NECK TECHNIQUE: Multidetector CT imaging of the head and neck was performed using the standard protocol during bolus administration of intravenous contrast. Multiplanar CT image reconstructions and MIPs were obtained to evaluate the vascular anatomy. Carotid stenosis measurements (when applicable) are obtained utilizing NASCET criteria, using the distal internal carotid diameter as the denominator. RADIATION DOSE REDUCTION: This exam was performed according to the departmental dose-optimization program which includes automated exposure control, adjustment of the mA and/or kV according to patient size and/or use of iterative reconstruction technique. CONTRAST:  53mL OMNIPAQUE IOHEXOL 350 MG/ML SOLN COMPARISON:  MRI from earlier the same day. FINDINGS: CTA NECK FINDINGS Aortic arch: Visualized aortic arch normal  caliber with standard branch pattern. Mild atheromatous change about the arch itself. No stenosis about the origin of the great vessels. Right carotid system: Right common and internal carotid arteries are patent without dissection. Mild eccentric plaque about the right carotid bulb without hemodynamically significant stenosis. Left carotid system: Left common and internal carotid arteries patent without dissection. Mild atheromatous change about the left carotid bulb without hemodynamically significant stenosis. Vertebral arteries: Both vertebral arteries arise from the subclavian arteries. No proximal subclavian artery stenosis. Right vertebral artery is dominant and patent without significant stenosis or dissection. 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 Irregular distal reconstitution at the left V2 segment via muscular collaterals. Left vertebral artery attenuated and irregular but patent distally to the skull base. Skeleton: No discrete or worrisome osseous lesions. Moderate multilevel cervical spondylosis, most pronounced at C6-7. Prominent right-sided facet arthrosis noted within the cervical spine. Other neck: No other  acute soft tissue abnormality within the neck. Upper chest: Irregular densities noted within the peripheral left lung, nonspecific, but could reflect atelectasis or possibly infiltrate. Review of the MIP images confirms the above findings CTA HEAD FINDINGS Anterior circulation: Atheromatous change throughout the carotid siphons with associated moderate multifocal narrowing. A1 segments patent bilaterally. Normal anterior communicating artery complex. Anterior cerebral arteries patent without stenosis. Right M1 segment widely patent. Short-segment mild stenosis involving the mid left M1 segment noted. No proximal MCA branch occlusion or high-grade stenosis. Distal MCA branches perfused and symmetric. Posterior circulation: Dominant right V4 segment widely patent.  Right PICA patent. Hypoplastic left vertebral artery terminates in PICA. Left PICA patent as well. Basilar markedly diminutive but patent without stenosis. Superior cerebral arteries patent bilaterally. Right PCA supplied via the basilar. Fetal type origin of the left PCA. Both PCAs patent to their distal aspects without stenosis. Venous sinuses: Grossly patent allowing for timing the contrast bolus. Anatomic variants: Fetal type origin of the left PCA with diminutive vertebrobasilar system. Hypoplastic left vertebral artery terminates in PICA. No aneurysm. Review of the MIP images confirms the above findings IMPRESSION: 1. Negative CTA for acute large vessel occlusion or other emergent finding. 2. 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 Irregular distal reconstitution at the left V2 segment via muscular collaterals. Left vertebral artery terminates in PICA. Dominant right vertebral artery widely patent. Overall vertebrobasilar system is diminutive based off a fetal type left PCA. 3. Atheromatous change throughout the carotid siphons with associated moderate multifocal narrowing. 4. Irregular densities within the peripheral left lung, nonspecific, but could reflect atelectasis or possibly infiltrate. Correlation with plain film radiography suggested as warranted. Electronically Signed   By: Jeannine Boga M.D.   On: 03/19/2023 21:47   MR BRAIN WO CONTRAST  Result Date: 03/19/2023 CLINICAL DATA:  Stroke suspected EXAM: MRI HEAD WITHOUT CONTRAST TECHNIQUE: Multiplanar, multiecho pulse sequences of the brain and surrounding structures were obtained without intravenous contrast. COMPARISON:  No prior MRI available, correlation is made with CT head 03/19/2023 FINDINGS: Brain: Restricted diffusion with ADC correlate in the left pons (series 5, images 11-14). This is associated with increased T2 hyperintense signal. No acute hemorrhage, mass, mass effect, or midline shift. No hydrocephalus or  extra-axial collection. No hemosiderin deposition to suggest remote hemorrhage. Vascular: Normal arterial flow voids. Skull and upper cervical spine: Normal marrow signal. Sinuses/Orbits: Mucosal thickening in the ethmoid air cells. No acute finding in the orbits. Other: Fluid in the left-greater-than-right mastoid air cells. 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 These results were called by telephone at the time of interpretation on 03/19/2023 at 7:38 pm to provider Dr. Vivi Martens, who verbally acknowledged these results. Electronically Signed   By: Merilyn Baba M.D.   On: 03/19/2023 19:40   CT HEAD WO CONTRAST  Result Date: 03/19/2023 CLINICAL DATA:  Neuro deficit, acute, stroke suspected LAST KNOWN NORMAL YESTERDAY 1200 EXAM: CT HEAD WITHOUT CONTRAST TECHNIQUE: Contiguous axial images were obtained from the base of the skull through the vertex without intravenous contrast. RADIATION DOSE REDUCTION: This exam was performed according to the departmental dose-optimization program which includes automated exposure control, adjustment of the mA and/or kV according to patient size and/or use of iterative reconstruction technique. COMPARISON:  None Available. FINDINGS: Brain: No evidence of large-territorial acute infarction. No parenchymal hemorrhage. No mass lesion. No extra-axial collection. No mass effect or midline shift. No hydrocephalus. Basilar cisterns are patent. Vascular: No hyperdense vessel. Atherosclerotic calcifications are present within the cavernous  internal carotid arteries. Skull: No acute fracture or focal lesion. Sinuses/Orbits: Paranasal sinuses and mastoid air cells are clear. The orbits are unremarkable. Other: None. IMPRESSION: No acute intracranial abnormality. Electronically Signed   By: Iven Finn M.D.   On: 03/19/2023 19:14    PHYSICAL EXAM  Temp:  [97.9 F (36.6 C)-98.4 F (36.9 C)] 98.1 F (36.7 C) (03/25 1118) Pulse Rate:  [89-102] 89 (03/25 1118) Resp:  [12-18]  16 (03/25 1118) BP: (145-171)/(80-101) 145/88 (03/25 1118) SpO2:  [95 %-100 %] 95 % (03/25 1118) Weight:  [78.9 kg] 78.9 kg (03/24 1815)  General - Well nourished, well developed pleasant middle-age Caucasian male, in no apparent distress.  Cardiovascular - Regular rhythm and rate.  Mental Status -  Level of arousal and orientation to time, place, and person were intact. Language including expression, naming, repetition, comprehension was assessed and found intact Attention span and concentration were normal. Recent and remote memory were intact. Fund of Knowledge was assessed and was intact.  Cranial Nerves II - XII - II - Visual field intact OU. III, IV, VI - Extraocular movements intact. V - Facial sensation intact bilaterally. VII - Slight right facial droop VIII - Hearing & vestibular intact bilaterally. X - Palate elevates symmetrically, no hoarseness. XI - Chin turning & shoulder shrug intact bilaterally. XII - Tongue protrusion midline.  Motor Strength - The patient's strength was normal in all extremities and pronator drift was absent.  Bulk was normal and fasciculations were absent.  This is improved from a 4/5 strength on the right side.  Diminished fine finger movements on the right.  Orbits left or right upper extremity.  Trace right grip weakness.  No upper or lower extremity drift Motor Tone - Muscle tone was assessed at the neck and appendages and was normal.  Sensory - Light touch assessed and were symmetrical.    Coordination - Decreased fine motor on the right arm. tremor was absent.  Gait and Station - deferred.  NIHSS 1 Premorbid modified Rankin score 0  ASSESSMENT/PLAN  Miracle Narasimhan is a 67 y.o. male who noticed Saturday afternoon, he states sometime mid afternoon possibly around three, he started having some right-sided weakness.  Since that time, it has maybe gotten slightly worse, his wife notes that he has been slurring his speech slightly. Due to  these problems he sought care at [facility] emergency department where an MRI was obtained demonstrating a pontine stroke. Recevied 300mg  Plavix load.   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  Code Stroke CT head: No acute abnormality.     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  Left vertebral artery terminates in PICA. Dominant right vertebral artery, widely patent.  MRI : Acute infarct in the left pons  2D Echo: EF 55 to [id number], grade 1 diastolic dysfunction, trivial MVR, aortic valve sclerosis present without any evidence of aortic stenosis.  LDL 132 HgbA1c 9.9 VTE prophylaxis - lovenox    Diet   Diet Carb Modified Fluid consistency: Thin; Room service appropriate? Yes   clopidogrel 75 mg daily prior to admission, now on clopidogrel 75 mg daily. Continue Plavix.  And [facility] Stroke trial medicine Therapy recommendations:  No follow-up needed Disposition:  Home  Hypertension Home meds:  amlodipine 5mg , on hold UnStable Permissive hypertension (OK if < 220/120) but gradually normalize 2-3 days Long-term BP goal normotensive  Hyperlipidemia Home meds: Crestor  20, resumed in hospital LDL 132, goal < 70 Increase to Crestor 40 mg Continue statin at discharge  Diabetes type II UnControlled Home meds: Jaynee Eagles, metformin HgbA1c 9.9, goal < 7.0 CBGs Recent Labs    03/19/23 2258 03/20/23 0758 03/20/23 1134  GLUCAP 178* 281* 202*    SSI Close follow-up with PCP  Other Stroke Risk Factors Advanced Age >/= 23   Hospital day # 0  Patient is OK for discharge from neurology standpoint, with recommendations as above. Follow-up with outpatient neurology in 8 weeks.    Pt seen by Neuro NP/APP and later by MD. Note/plan to be edited by MD as needed.    Otelia Santee, DNP, AGACNP-BC [facility] Please use AMION for pager and EPIC for  messaging  STROKE MD NOTE :  I have personally obtained history,examined this patient, reviewed notes, independently viewed imaging studies, participated in medical decision making and plan of care.ROS completed by me personally and pertinent positives fully documented  I have made any additions or clarifications directly to the above note. Agree with note above.  He presented with sudden onset of right-sided weakness and slurred speech which appears to have mostly improved.  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  Continue ongoing stroke workup.  Patient is allergic to aspirin hence Plavix alone.  Aggressive risk factor modification.  Patient also appears to be interested in participating in [place] stroke trial(standard antiplatelet therapy plus minus factor XI inhibitor Asundexian ).  He was given information to review and decide.  It was made clear study participation is voluntary and he will get the same excellent medical care whether he participates in the study or not.  He can withdraw from the study at any point.  He and his wife expressed understanding.  Discussed with Dr. Posey Pronto.  Greater than 50% time during this 50-minute visit were spent on counseling and coordination of care about his pontine stroke and discussion about stroke evaluation and prevention and treatment and answering questions.  Antony Contras, MD Medical Director [facility] Stroke Center Pager: [phone] 03/20/2023 3:25 PM   To contact Stroke Continuity provider, please refer to [email or web address] After hours, contact General Neurology

## 2023-03-21 ENCOUNTER — Telehealth: Payer: Self-pay

## 2023-03-21 LAB — HEMOGLOBIN A1C
Hgb A1c MFr Bld: 9.4 % — ABNORMAL HIGH (ref 4.8–5.6)
Hgb A1c MFr Bld: 9.4 % — ABNORMAL HIGH (ref 4.8–5.6)
Mean Plasma Glucose: 223 mg/dL
Mean Plasma Glucose: 223 mg/dL

## 2023-03-21 NOTE — Discharge Summary (Signed)
Physician Discharge Summary   Patient: Ethan Boyd MRN: [id number] DOB: 05/11/1956  Admit date:     03/19/2023  Discharge date: 03/20/2023  Discharge Physician: Berle Mull  PCP: Biagio Borg, MD  Recommendations at discharge: Follow-up with PCP and neurology as recommended.   Follow-up Information     Biagio Borg, MD. Schedule an appointment as soon as possible for a visit in 1 week(s).   Specialties: Internal Medicine, Radiology Contact information: [place] [address] [facility]. Schedule an appointment as soon as possible for a visit in 2 week(s).   Specialty: Neurology Contact information: [address] [place] [phone]               Discharge Diagnoses: Principal Problem:   CVA (cerebral vascular accident) [facility]) Active Problems:   Essential hypertension   Hyperlipidemia LDL goal <70   Insulin dependent type 2 diabetes mellitus ([place])   COPD (chronic obstructive pulmonary disease) ([place])  Assessment and Plan  Acute left pontine infarct. Most likely secondary to small vessel disease. CT head unremarkable for any acute stroke. CTA head and neck negative for any large vessel occlusion. MRI brain shows evidence of acute infarct in the left pons area. Echocardiogram shows preserved EF. LDL 132. Hemoglobin A1c 9.9. Not taking medications consistently prior to admission. Will be on Crestor 40 mg on discharge. PT OT recommended no therapy. Neurology was consulted. Patient was on 75 mg Plavix prior to admission. On discharge patient will be on Plavix and [place] stroke trial medication.  HTN. Blood pressure currently elevated. Will resume amlodipine in a few days to allow for permissive hypertension.  Type 2 diabetes mellitus, uncontrolled with hyperglycemia without long-term insulin use with HLD. Patient has medication prescribed but  does not use that on a regular basis at home. Hemoglobin A1c remains elevated more than 9. Will recommend the patient to resume taking metformin as well as [place]. Will also recommend insulin therapy on discharge. Outpatient follow-up with PCP recommended for further chest pain. New prescriptions were provided.  Consultants:  Neurology  Procedures performed:  Echocardiogram   DISCHARGE MEDICATION: Allergies as of 03/20/2023       Reactions   Aspirin    REACTION: angioedema        Medication List     STOP taking these medications    indapamide 1.25 MG tablet Commonly known as: LOZOL   Soliqua 100-33 UNT-MCG/ML Sopn Generic drug: Insulin Glargine-Lixisenatide       TAKE these medications    amLODipine 5 MG tablet Commonly known as: NORVASC Take 1 tablet (5 mg total) by mouth daily. Start taking on: March 23, 2023 What changed: These instructions start on March 23, 2023. If you are unsure what to do until then, ask your doctor or other care provider.   Blood Glucose Monitoring Suppl Devi 1 each by Does not apply route 3 (three) times daily. May dispense any manufacturer covered by patient's insurance.   BLOOD GLUCOSE TEST STRIPS Strp 1 each by Does not apply route 3 (three) times daily. Use as directed to check blood sugar. May dispense any manufacturer covered by patient's insurance and fits patient's device.   clopidogrel 75 MG tablet Commonly known as: PLAVIX Take 1 tablet (75 mg total) by mouth daily.   dapagliflozin propanediol 10 MG Tabs tablet Commonly known as: Farxiga Take 1 tablet (10 mg total) by mouth  daily before breakfast.   Fish Oil 1200 MG Caps Take by mouth.   FreeStyle Libre 2 Reader Esparto 1 Act by Does not apply route daily.   FreeStyle Libre 2 Sensor Misc 1 Act by Does not apply route daily.   insulin glargine 100 UNIT/ML Solostar Pen Commonly known as: LANTUS Inject 8 Units into the skin at bedtime.   Lancet Device Misc 1 each  by Does not apply route 3 (three) times daily. May dispense any manufacturer covered by patient's insurance.   Lancets Misc 1 each by Does not apply route 3 (three) times daily. Use as directed to check blood sugar. May dispense any manufacturer covered by patient's insurance and fits patient's device.   metFORMIN 750 MG 24 hr tablet Commonly known as: Glucophage XR Take 2 tablets (1,500 mg total) by mouth daily with breakfast.   multivitamin with minerals Tabs tablet Take 1 tablet by mouth daily.   OCEANIC-STROKE asundexian or placebo 50 mg tablet Take 1 tablet (50 mg total) by mouth daily. For Investigational Use Only. Take at the same time each day (preferably in the morning). Tablet should be swallowed whole with water; it CANNOT be crushed or broken. Please contact [facility] for any questions or concerns regarding this medication.   Pen Needles 31G X 5 MM Misc 1 each by Does not apply route 3 (three) times daily. May dispense any manufacturer covered by patient's insurance. What changed:  medication strength how much to take when to take this additional instructions   rosuvastatin 40 MG tablet Commonly known as: Crestor Take 1 tablet (40 mg total) by mouth daily. What changed:  medication strength how much to take       Disposition: Home Diet recommendation: Carb modified diet  Discharge Exam: Vitals:   03/20/23 0341 03/20/23 0720 03/20/23 1118 03/20/23 1524  BP: (!) 165/101 (!) 153/89 (!) 145/88 (!) 158/80  Pulse: 93 92 89 89  Resp: 12 15 16 15   Temp: 98.3 F (36.8 C) 98.4 F (36.9 C) 98.1 F (36.7 C) 98.4 F (36.9 C)  TempSrc:  Oral Oral Oral  SpO2: 97% 96% 95% 97%  Weight:      Height:       General: Appear in no distress; no visible Abnormal Neck Mass Or lumps, Conjunctiva normal Cardiovascular: S1 and S2 Present, no Murmur, Respiratory: good respiratory effort, Bilateral Air entry present and CTA, no Crackles, no wheezes Abdomen:  Bowel Sound present, Non tender  Extremities: no Pedal edema Neurology: alert and oriented to time, place, and person right facial droop and right sided weakness. Filed Weights   03/19/23 1815  Weight: 78.9 kg   Condition at discharge: stable  The results of significant diagnostics from this hospitalization (including imaging, microbiology, ancillary and laboratory) are listed below for reference.   Imaging Studies: ECHOCARDIOGRAM COMPLETE  Result Date: 03/20/2023    ECHOCARDIOGRAM REPORT   Patient Name:   Ethan Boyd Date of Exam: 03/20/2023 Medical Rec #:  [id number]     Height:       72.0 in Accession #:    [id number]    Weight:       173.9 lb Date of Birth:  1956/12/24    BSA:          2.008 m Patient Age:    67 years      BP:           153/89 mmHg Patient Gender: M  HR:           99 bpm. Exam Location:  Inpatient Procedure: 2D Echo, Cardiac Doppler and Color Doppler Indications:    Stroke  History:        Patient has prior history of Echocardiogram examinations. COPD;                 Risk Factors:Hypertension and Dyslipidemia.  Sonographer:    Danne Baxter RDCS, FE, PE Referring Phys: [id number] [place] T TU IMPRESSIONS  1. Left ventricular ejection fraction, by estimation, is 55 to 60%. The left ventricle has normal function. The left ventricle has no regional wall motion abnormalities. Left ventricular diastolic parameters are consistent with Grade I diastolic dysfunction (impaired relaxation).  2. Right ventricular systolic function is normal. The right ventricular size is normal.  3. The mitral valve is normal in structure. Trivial mitral valve regurgitation. No evidence of mitral stenosis.  4. The aortic valve is tricuspid. There is mild calcification of the aortic valve. Aortic valve regurgitation is not visualized. Aortic valve sclerosis/calcification is present, without any evidence of aortic stenosis. Aortic valve area, by VTI measures  2.26 cm. Aortic valve mean gradient  measures 4.0 mmHg. Aortic valve Vmax measures 1.35 m/s.  5. The inferior vena cava is normal in size with greater than 50% respiratory variability, suggesting right atrial pressure of 3 mmHg. FINDINGS  Left Ventricle: Left ventricular ejection fraction, by estimation, is 55 to 60%. The left ventricle has normal function. The left ventricle has no regional wall motion abnormalities. The left ventricular internal cavity size was normal in size. There is  no left ventricular hypertrophy. Left ventricular diastolic parameters are consistent with Grade I diastolic dysfunction (impaired relaxation). Right Ventricle: The right ventricular size is normal. No increase in right ventricular wall thickness. Right ventricular systolic function is normal. Left Atrium: Left atrial size was normal in size. Right Atrium: Right atrial size was normal in size. Pericardium: There is no evidence of pericardial effusion. Mitral Valve: The mitral valve is normal in structure. Trivial mitral valve regurgitation. No evidence of mitral valve stenosis. Tricuspid Valve: The tricuspid valve is normal in structure. Tricuspid valve regurgitation is trivial. No evidence of tricuspid stenosis. Aortic Valve: The aortic valve is tricuspid. There is mild calcification of the aortic valve. Aortic valve regurgitation is not visualized. Aortic valve sclerosis/calcification is present, without any evidence of aortic stenosis. Aortic valve mean gradient measures 4.0 mmHg. Aortic valve peak gradient measures 7.3 mmHg. Aortic valve area, by VTI measures 2.26 cm. Pulmonic Valve: The pulmonic valve was normal in structure. Pulmonic valve regurgitation is not visualized. No evidence of pulmonic stenosis. Aorta: The aortic root is normal in size and structure. Venous: The inferior vena cava is normal in size with greater than 50% respiratory variability, suggesting right atrial pressure of 3 mmHg. IAS/Shunts: No atrial level shunt detected by color flow  Doppler.  LEFT VENTRICLE PLAX 2D LVIDd:         4.90 cm   Diastology LVIDs:         3.40 cm   LV e' medial:    6.31 cm/s LV PW:         1.00 cm   LV E/e' medial:  6.0 LV IVS:        0.90 cm   LV e' lateral:   7.51 cm/s LVOT diam:     2.10 cm   LV E/e' lateral: 5.0 LV SV:         52 LV  SV Index:   26 LVOT Area:     3.46 cm  RIGHT VENTRICLE RV S prime:     13.50 cm/s TAPSE (M-mode): 1.6 cm LEFT ATRIUM             Index        RIGHT ATRIUM           Index LA diam:        3.20 cm 1.59 cm/m   RA Area:     14.40 cm LA Vol (A2C):   66.6 ml 33.17 ml/m  RA Volume:   34.40 ml  17.13 ml/m LA Vol (A4C):   45.6 ml 22.71 ml/m LA Biplane Vol: 56.0 ml 27.89 ml/m  AORTIC VALVE AV Area (Vmax):    2.49 cm AV Area (Vmean):   2.37 cm AV Area (VTI):     2.26 cm AV Vmax:           135.00 cm/s AV Vmean:          94.400 cm/s AV VTI:            0.231 m AV Peak Grad:      7.3 mmHg AV Mean Grad:      4.0 mmHg LVOT Vmax:         96.90 cm/s LVOT Vmean:        64.500 cm/s LVOT VTI:          0.151 m LVOT/AV VTI ratio: 0.65  AORTA Ao Root diam: 3.40 cm MITRAL VALVE               TRICUSPID VALVE MV Area (PHT): 7.16 cm    TR Peak grad:   7.0 mmHg MV Decel Time: 106 msec    TR Vmax:        132.00 cm/s MV E velocity: 37.70 cm/s MV A velocity: 79.70 cm/s  SHUNTS MV E/A ratio:  0.47        Systemic VTI:  0.15 m                            Systemic Diam: 2.10 cm Glori Bickers MD Electronically signed by Glori Bickers MD Signature Date/Time: 03/20/2023/12:32:59 PM    Final    CT ANGIO HEAD NECK W WO CM  Result Date: 03/19/2023 CLINICAL DATA:  Initial evaluation for neuro deficit, stroke. EXAM: CT ANGIOGRAPHY HEAD AND NECK TECHNIQUE: Multidetector CT imaging of the head and neck was performed using the standard protocol during bolus administration of intravenous contrast. Multiplanar CT image reconstructions and MIPs were obtained to evaluate the vascular anatomy. Carotid stenosis measurements (when applicable) are obtained utilizing  NASCET criteria, using the distal internal carotid diameter as the denominator. RADIATION DOSE REDUCTION: This exam was performed according to the departmental dose-optimization program which includes automated exposure control, adjustment of the mA and/or kV according to patient size and/or use of iterative reconstruction technique. CONTRAST:  76mL OMNIPAQUE IOHEXOL 350 MG/ML SOLN COMPARISON:  MRI from earlier the same day. FINDINGS: CTA NECK FINDINGS Aortic arch: Visualized aortic arch normal caliber with standard branch pattern. Mild atheromatous change about the arch itself. No stenosis about the origin of the great vessels. Right carotid system: Right common and internal carotid arteries are patent without dissection. Mild eccentric plaque about the right carotid bulb without hemodynamically significant stenosis. Left carotid system: Left common and internal carotid arteries patent without dissection. Mild atheromatous change about the left carotid bulb without hemodynamically significant stenosis.  Vertebral arteries: Both vertebral arteries arise from the subclavian arteries. No proximal subclavian artery stenosis. Right vertebral artery is dominant and patent without significant stenosis or dissection. 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 Irregular distal reconstitution at the left V2 segment via muscular collaterals. Left vertebral artery attenuated and irregular but patent distally to the skull base. Skeleton: No discrete or worrisome osseous lesions. Moderate multilevel cervical spondylosis, most pronounced at C6-7. Prominent right-sided facet arthrosis noted within the cervical spine. Other neck: No other acute soft tissue abnormality within the neck. Upper chest: Irregular densities noted within the peripheral left lung, nonspecific, but could reflect atelectasis or possibly infiltrate. Review of the MIP images confirms the above findings CTA HEAD FINDINGS Anterior  circulation: Atheromatous change throughout the carotid siphons with associated moderate multifocal narrowing. A1 segments patent bilaterally. Normal anterior communicating artery complex. Anterior cerebral arteries patent without stenosis. Right M1 segment widely patent. Short-segment mild stenosis involving the mid left M1 segment noted. No proximal MCA branch occlusion or high-grade stenosis. Distal MCA branches perfused and symmetric. Posterior circulation: Dominant right V4 segment widely patent. Right PICA patent. Hypoplastic left vertebral artery terminates in PICA. Left PICA patent as well. Basilar markedly diminutive but patent without stenosis. Superior cerebral arteries patent bilaterally. Right PCA supplied via the basilar. Fetal type origin of the left PCA. Both PCAs patent to their distal aspects without stenosis. Venous sinuses: Grossly patent allowing for timing the contrast bolus. Anatomic variants: Fetal type origin of the left PCA with diminutive vertebrobasilar system. Hypoplastic left vertebral artery terminates in PICA. No aneurysm. Review of the MIP images confirms the above findings IMPRESSION: 1. Negative CTA for acute large vessel occlusion or other emergent finding. 2. 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 Irregular distal reconstitution at the left V2 segment via muscular collaterals. Left vertebral artery terminates in PICA. Dominant right vertebral artery widely patent. Overall vertebrobasilar system is diminutive based off a fetal type left PCA. 3. Atheromatous change throughout the carotid siphons with associated moderate multifocal narrowing. 4. Irregular densities within the peripheral left lung, nonspecific, but could reflect atelectasis or possibly infiltrate. Correlation with plain film radiography suggested as warranted. Electronically Signed   By: Jeannine Boga M.D.   On: 03/19/2023 21:47   MR BRAIN WO CONTRAST  Result Date: 03/19/2023 CLINICAL  DATA:  Stroke suspected EXAM: MRI HEAD WITHOUT CONTRAST TECHNIQUE: Multiplanar, multiecho pulse sequences of the brain and surrounding structures were obtained without intravenous contrast. COMPARISON:  No prior MRI available, correlation is made with CT head 03/19/2023 FINDINGS: Brain: Restricted diffusion with ADC correlate in the left pons (series 5, images 11-14). This is associated with increased T2 hyperintense signal. No acute hemorrhage, mass, mass effect, or midline shift. No hydrocephalus or extra-axial collection. No hemosiderin deposition to suggest remote hemorrhage. Vascular: Normal arterial flow voids. Skull and upper cervical spine: Normal marrow signal. Sinuses/Orbits: Mucosal thickening in the ethmoid air cells. No acute finding in the orbits. Other: Fluid in the left-greater-than-right mastoid air cells. 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 These results were called by telephone at the time of interpretation on 03/19/2023 at 7:38 pm to provider Dr. Vivi Martens, who verbally acknowledged these results. Electronically Signed   By: Merilyn Baba M.D.   On: 03/19/2023 19:40   CT HEAD WO CONTRAST  Result Date: 03/19/2023 CLINICAL DATA:  Neuro deficit, acute, stroke suspected LAST KNOWN NORMAL YESTERDAY 1200 EXAM: CT HEAD WITHOUT CONTRAST TECHNIQUE: Contiguous axial images were obtained from  the base of the skull through the vertex without intravenous contrast. RADIATION DOSE REDUCTION: This exam was performed according to the departmental dose-optimization program which includes automated exposure control, adjustment of the mA and/or kV according to patient size and/or use of iterative reconstruction technique. COMPARISON:  None Available. FINDINGS: Brain: No evidence of large-territorial acute infarction. No parenchymal hemorrhage. No mass lesion. No extra-axial collection. No mass effect or midline shift. No hydrocephalus. Basilar cisterns are patent. Vascular: No hyperdense vessel.  Atherosclerotic calcifications are present within the cavernous internal carotid arteries. Skull: No acute fracture or focal lesion. Sinuses/Orbits: Paranasal sinuses and mastoid air cells are clear. The orbits are unremarkable. Other: None. IMPRESSION: No acute intracranial abnormality. Electronically Signed   By: Iven Finn M.D.   On: 03/19/2023 19:14    Microbiology: Results for orders placed or performed in visit on 12/09/19  Novel Coronavirus, NAA ([facility])     Status: None   Collection Time: 12/09/19 12:00 AM   Specimen: Nasopharyngeal(NP) swabs in vial transport medium   NASOPHARYNGE  TESTING  Result Value Ref Range Status   SARS-CoV-2, NAA Not Detected Not Detected Final    Comment: This nucleic acid amplification test was developed and its performance characteristics determined by [employer]. Nucleic acid amplification tests include PCR and TMA. This test has not been FDA cleared or approved. This test has been authorized by FDA under an Emergency Use Authorization (EUA). This test is only authorized for the duration of time the declaration that circumstances exist justifying the authorization of the emergency use of in vitro diagnostic tests for detection of SARS-CoV-2 virus and/or diagnosis of COVID-19 infection under section 564(b)(1) of the Act, 21 U.S.C. [id number]) (1), unless the authorization is terminated or revoked sooner. When diagnostic testing is negative, the possibility of a false negative result should be considered in the context of a patient's recent exposures and the presence of clinical signs and symptoms consistent with COVID-19. An individual without symptoms of COVID-19 and who is not shedding SARS-CoV-2 virus would  expect to have a negative (not detected) result in this assay.    Labs: CBC: Recent Labs  Lab 03/19/23 1930 03/19/23 1937  WBC 8.4  --   NEUTROABS 5.0  --   HGB 15.8 17.0  HCT 44.9 50.0  MCV 92.2  --   PLT 214  --     Basic Metabolic Panel: Recent Labs  Lab 03/19/23 1930 03/19/23 1937  NA 129* 130*  K 3.6 3.7  CL 90* 90*  CO2 21*  --   GLUCOSE 205* 198*  BUN 9 7*  CREATININE 0.81 1.10  CALCIUM 9.3  --    Liver Function Tests: Recent Labs  Lab 03/19/23 1930  AST 32  ALT 37  ALKPHOS 107  BILITOT 0.6  PROT 8.2*  ALBUMIN 4.4   CBG: Recent Labs  Lab 03/19/23 1814 03/19/23 2258 03/20/23 0758 03/20/23 1134 03/20/23 1626  GLUCAP 246* 178* 281* 202* 248*    Discharge time spent: greater than 30 minutes.  Signed: Berle Mull, MD [facility] 03/20/2023

## 2023-03-21 NOTE — Transitions of Care (Post Inpatient/ED Visit) (Signed)
   03/21/2023  Name: Ethan Boyd MRN: [id number] DOB: September 18, 1956  Today's TOC FU Call Status: Today's TOC FU Call Status:: Unsuccessul Call (1st Attempt) Unsuccessful Call (1st Attempt) Date: 03/21/23  Attempted to reach the patient regarding the most recent Inpatient/ED visit.  Follow Up Plan: Additional outreach attempts will be made to reach the patient to complete the Transitions of Care (Post Inpatient/ED visit) call.   [place] LPN [place] Advisor Direct Dial [phone]

## 2023-03-22 NOTE — Transitions of Care (Post Inpatient/ED Visit) (Unsigned)
   03/22/2023  Name: Ethan Boyd MRN: [id number] DOB: 24-Mar-1956  Today's TOC FU Call Status: Today's TOC FU Call Status:: Unsuccessful Call (2nd Attempt) Unsuccessful Call (1st Attempt) Date: 03/21/23 Unsuccessful Call (2nd Attempt) Date: 03/22/23  Attempted to reach the patient regarding the most recent Inpatient/ED visit.  Follow Up Plan: Additional outreach attempts will be made to reach the patient to complete the Transitions of Care (Post Inpatient/ED visit) call.   [place] LPN [place] Advisor Direct Dial [phone]

## 2023-03-23 NOTE — Transitions of Care (Post Inpatient/ED Visit) (Signed)
   03/23/2023  Name: Ethan Boyd MRN: [id number] DOB: 02-03-1956  Today's TOC FU Call Status: Today's TOC FU Call Status:: Unsuccessful Call (3rd Attempt) Unsuccessful Call (1st Attempt) Date: 03/21/23 Unsuccessful Call (2nd Attempt) Date: 03/22/23 Unsuccessful Call (3rd Attempt) Date: 03/23/23  Attempted to reach the patient regarding the most recent Inpatient/ED visit.  Follow Up Plan: No further outreach attempts will be made at this time. We have been unable to contact the patient.  Pt has fu appt scheduled 03-28-23   [place] LPN [place] Advisor Direct Dial [phone]

## 2023-03-28 ENCOUNTER — Ambulatory Visit (INDEPENDENT_AMBULATORY_CARE_PROVIDER_SITE_OTHER): Payer: Medicare Other | Admitting: Internal Medicine

## 2023-03-28 VITALS — BP 128/74 | HR 74 | Temp 97.8°F | Ht 72.0 in | Wt 169.0 lb

## 2023-03-28 DIAGNOSIS — M21371 Foot drop, right foot: Secondary | ICD-10-CM | POA: Diagnosis not present

## 2023-03-28 DIAGNOSIS — E119 Type 2 diabetes mellitus without complications: Secondary | ICD-10-CM

## 2023-03-28 DIAGNOSIS — I10 Essential (primary) hypertension: Secondary | ICD-10-CM | POA: Diagnosis not present

## 2023-03-28 DIAGNOSIS — I639 Cerebral infarction, unspecified: Secondary | ICD-10-CM

## 2023-03-28 DIAGNOSIS — E785 Hyperlipidemia, unspecified: Secondary | ICD-10-CM

## 2023-03-28 DIAGNOSIS — E559 Vitamin D deficiency, unspecified: Secondary | ICD-10-CM

## 2023-03-28 DIAGNOSIS — E118 Type 2 diabetes mellitus with unspecified complications: Secondary | ICD-10-CM | POA: Diagnosis not present

## 2023-03-28 DIAGNOSIS — Z794 Long term (current) use of insulin: Secondary | ICD-10-CM

## 2023-03-28 MED ORDER — METFORMIN HCL ER 750 MG PO TB24: 1500.0000 mg | ORAL_TABLET | Freq: Every day | ORAL | 3 refills | Status: DC

## 2023-03-28 MED ORDER — AMLODIPINE BESYLATE 5 MG PO TABS: 5.0000 mg | ORAL_TABLET | Freq: Every day | ORAL | 3 refills | Status: DC

## 2023-03-28 MED ORDER — ROSUVASTATIN CALCIUM 40 MG PO TABS: 40.0000 mg | ORAL_TABLET | Freq: Every day | ORAL | 3 refills | Status: DC

## 2023-03-28 MED ORDER — CLOPIDOGREL BISULFATE 75 MG PO TABS: 75.0000 mg | ORAL_TABLET | Freq: Every day | ORAL | 3 refills | Status: DC

## 2023-03-28 NOTE — Progress Notes (Unsigned)
Patient ID: Ethan Boyd, male   DOB: [birth year], 67 y.o.   MRN: [id number]        Chief Complaint: follow up recent hospn 3/24 - 3/25 2-24 with acute stroke       HPI:  Ethan Boyd is a 67 y.o. male here with c/o recent left pontine acute CVA, now pt d/c on crestor 40 mg qd, also started plavix 75 mg qd  Amlodipine restarted after initial holding for permissive htn.  Not taking metformin - A1c 9.9 - pt asked to to resume farxiga in addition to the metformin.  Cbg 127 this am.  Also on new Oceanic trial medication.  Has appt to f/u with Neuro in 3 wks.   Also tolerting once daily insulin for now, pt plans to call to f/u with Endo soon.  Unfortunately today post d/c course is complicated by 6 days hx of subjective worsening RUE and RLE weakness and new right drop foot, asking for PT.  Has had good compliance with meds.  Pt denies chest pain, increased sob or doe, wheezing, orthopnea, PND, increased LE swelling, palpitations, dizziness or syncope.   Pt denies polydipsia, polyuria, or new focal neuro s/s.     Wt Readings from Last 3 Encounters:  03/28/23 169 lb (76.7 kg)  03/19/23 173 lb 15.1 oz (78.9 kg)  07/29/22 174 lb (78.9 kg)   BP Readings from Last 3 Encounters:  03/28/23 128/74  03/20/23 (!) 158/80  07/29/22 (!) 172/82         Past Medical History:  Diagnosis Date   DIABETES MELLITUS, TYPE II 02/18/2009   Qualifier: Diagnosis of  By: Jenny Reichmann MD, Hunt Oris    Diverticulosis 08/31/2012   Colonoscopy, June 10, 2009, Dr Perry/GI   ERECTILE DYSFUNCTION 02/18/2009   Qualifier: Diagnosis of  By: Jenny Reichmann MD, Hunt Oris    Erectile dysfunction 09/07/2012   HYPERLIPIDEMIA 02/18/2009   Qualifier: Diagnosis of  By: Jenny Reichmann MD, Hunt Oris    HYPERTENSION 02/18/2009   Qualifier: Diagnosis of  By: Jenny Reichmann MD, Hunt Oris    Past Surgical History:  Procedure Laterality Date   COLONOSCOPY  06/10/2009   right knee cartilage  12/26/1974    reports that he has never smoked. He has never used smokeless tobacco. He reports  current alcohol use of about 8.0 standard drinks of alcohol per week. He reports that he does not use drugs. family history includes Diabetes in an other family member. Allergies  Allergen Reactions   Aspirin     REACTION: angioedema   Current Outpatient Medications on File Prior to Visit  Medication Sig Dispense Refill   Blood Glucose Monitoring Suppl DEVI 1 each by Does not apply route 3 (three) times daily. May dispense any manufacturer covered by patient's insurance. 1 each 0   Continuous Blood Gluc Sensor (FREESTYLE LIBRE 2 SENSOR) MISC 1 Act by Does not apply route daily. 2 each 5   dapagliflozin propanediol (FARXIGA) 10 MG TABS tablet Take 1 tablet (10 mg total) by mouth daily before breakfast. 90 tablet 0   Glucose Blood (BLOOD GLUCOSE TEST STRIPS) STRP 1 each by Does not apply route 3 (three) times daily. Use as directed to check blood sugar. May dispense any manufacturer covered by patient's insurance and fits patient's device. 100 strip 0   insulin glargine (LANTUS) 100 UNIT/ML Solostar Pen Inject 8 Units into the skin at bedtime. 15 mL 0   Insulin Pen Needle (PEN NEEDLES) 31G X 5 MM MISC 1 each by Does  not apply route 3 (three) times daily. May dispense any manufacturer covered by patient's insurance. 100 each 0   Lancet Device MISC 1 each by Does not apply route 3 (three) times daily. May dispense any manufacturer covered by patient's insurance. 1 each 0   Lancets MISC 1 each by Does not apply route 3 (three) times daily. Use as directed to check blood sugar. May dispense any manufacturer covered by patient's insurance and fits patient's device. 100 each 0   Multiple Vitamin (MULTIVITAMIN WITH MINERALS) TABS tablet Take 1 tablet by mouth daily.     Omega-3 Fatty Acids (FISH OIL) 1200 MG CAPS Take by mouth.     Study - OCEANIC-STROKE - asundexian 50 mg or placebo tablet (PI-Sethi) Take 1 tablet (50 mg total) by mouth daily. For Investigational Use Only. Take at the same time each day  (preferably in the morning). Tablet should be swallowed whole with water; it CANNOT be crushed or broken. Please contact [facility] for any questions or concerns regarding this medication. 98 tablet 0   Continuous Blood Gluc Receiver (FREESTYLE LIBRE 2 READER) DEVI 1 Act by Does not apply route daily. (Patient not taking: Reported on 01/12/2022) 2 each 5   No current facility-administered medications on file prior to visit.        ROS:  All others reviewed and negative.  Objective        PE:  BP 128/74   Pulse 74   Temp 97.8 F (36.6 C) (Oral)   Ht 6' (1.829 m)   Wt 169 lb (76.7 kg)   SpO2 97%   BMI 22.92 kg/m                 Constitutional: Pt appears in NAD               HENT: Head: NCAT.                Right Ear: External ear normal.                 Left Ear: External ear normal.                Eyes: . Pupils are equal, round, and reactive to light. Conjunctivae and EOM are normal               Nose: without d/c or deformity               Neck: Neck supple. Gross normal ROM               Cardiovascular: Normal rate and regular rhythm.                 Pulmonary/Chest: Effort normal and breath sounds without rales or wheezing.                Abd:  Soft, NT, ND, + BS, no organomegaly               Neurological: Pt is alert. At baseline orientation, motor with 3+ RUE and RLE weakness and right drop foot with ambulation               Skin: Skin is warm. No rashes, no other new lesions, LE edema - none               Psychiatric: Pt behavior is normal without agitation   Micro: none  Cardiac tracings I have personally interpreted today:  none  Pertinent Radiological findings (  summarize): none   Lab Results  Component Value Date   WBC 8.4 03/19/2023   HGB 17.0 03/19/2023   HCT 50.0 03/19/2023   PLT 214 03/19/2023   GLUCOSE 198 (H) 03/19/2023   CHOL 254 (H) 03/19/2023   TRIG 200 (H) 03/19/2023   HDL 82 03/19/2023   LDLDIRECT 136.4 09/10/2012   LDLCALC 132  (H) 03/19/2023   ALT 37 03/19/2023   AST 32 03/19/2023   NA 130 (L) 03/19/2023   K 3.7 03/19/2023   CL 90 (L) 03/19/2023   CREATININE 1.10 03/19/2023   BUN 7 (L) 03/19/2023   CO2 21 (L) 03/19/2023   TSH 1.28 07/29/2022   PSA 1.07 07/29/2022   INR 1.0 03/19/2023   HGBA1C 9.4 (H) 03/19/2023   MICROALBUR 87.7 (H) 07/29/2022   Assessment/Plan:  Ethan Boyd is a 67 y.o. White or Caucasian [1] male with  has a past medical history of DIABETES MELLITUS, TYPE II (02/18/2009), Diverticulosis (08/31/2012), ERECTILE DYSFUNCTION (02/18/2009), Erectile dysfunction (09/07/2012), HYPERLIPIDEMIA (02/18/2009), and HYPERTENSION (02/18/2009).  Acute stroke due to ischemia Now with 6 days worsening subjective RUE and RLE weakness in addition to new right foot drop, and hx of some painful spasticity RUE now resolved - for repeat MRI brain stat to r/o stroke extension and or hemorrhagic evolution on plavix and oceanic trial med; cont all meds for now, has f/u with neuro in 3 wks, will also need outpt referral for PT repeat evaluation, and AFO for right foot drop  Essential hypertension BP Readings from Last 3 Encounters:  03/28/23 128/74  03/20/23 (!) 158/80  07/29/22 (!) 172/82   Stable, pt to continue medical treatment norvasc 5 mg qd   Right foot drop Also for AFO for RLE drop foot - rx given  Vitamin D deficiency Last vitamin D Lab Results  Component Value Date   VD25OH 36.45 07/29/2022   Low, to start oral replacement   Hyperlipidemia LDL goal <70 Lab Results  Component Value Date   LDLCALC 132 (H) 03/19/2023   uncontrolled, pt to continue current statin increased to 40 mg crestor   Type II diabetes mellitus with manifestations (HCC) Lab Results  Component Value Date   HGBA1C 9.4 (H) 03/19/2023   Uncontrolled, pt to continue current medical treatment farxiga 10 qd, lantus 8 qhs, but increase metformin ER 750 to 2 tabs qd, and f/u endo as planned  Followup: Return in about 3 months  (around 06/27/2023).  Cathlean Cower, MD 03/29/2023 9:10 PM [facility]

## 2023-03-28 NOTE — Patient Instructions (Signed)
You are given the order for the right leg AFO for the right foot drop  Please be sure to call Endo Dr Kelton Pillar for sugar follow up  Please continue all other medications as before, and refills have been done if requested.  Please have the pharmacy call with any other refills you may need.  Please continue your efforts at being more active, low cholesterol diet, and weight control.  Please keep your appointments with your specialists as you may have planned  You will be contacted regarding the referral for: MRI brain repeat - stat if possible  You will be contacted regarding the referral for: Physical Therapy  No further lab work needed today  Please make an Appointment to return in 3 months, or sooner if needed

## 2023-03-28 NOTE — Assessment & Plan Note (Signed)
Now with 6 days worsening subjective RUE and RLE weakness in addition to new right foot drop, and hx of some painful spasticity RUE now resolved - for repeat MRI brain stat to r/o stroke extension and or hemorrhagic evolution on plavix and oceanic trial med; cont all meds for now, has f/u with neuro in 3 wks, will also need outpt referral for PT repeat evaluation, and AFO for right foot drop

## 2023-03-29 ENCOUNTER — Ambulatory Visit
Admission: RE | Admit: 2023-03-29 | Discharge: 2023-03-29 | Disposition: A | Discharge status: Home / Self Care | Payer: Federal, State, Local not specified - PPO | Source: Ambulatory Visit | Attending: Internal Medicine | Admitting: Internal Medicine

## 2023-03-29 ENCOUNTER — Encounter: Payer: Self-pay | Admitting: Internal Medicine

## 2023-03-29 DIAGNOSIS — I639 Cerebral infarction, unspecified: Secondary | ICD-10-CM

## 2023-03-29 DIAGNOSIS — M21371 Foot drop, right foot: Secondary | ICD-10-CM

## 2023-03-29 NOTE — Assessment & Plan Note (Signed)
Also for AFO for RLE drop foot - rx given

## 2023-03-29 NOTE — Assessment & Plan Note (Signed)
Lab Results  Component Value Date   HGBA1C 9.4 (H) 03/19/2023   Uncontrolled, pt to continue current medical treatment farxiga 10 qd, lantus 8 qhs, but increase metformin ER 750 to 2 tabs qd, and f/u endo as planned

## 2023-03-29 NOTE — Assessment & Plan Note (Signed)
Lab Results  Component Value Date   LDLCALC 132 (H) 03/19/2023   uncontrolled, pt to continue current statin increased to 40 mg crestor

## 2023-03-29 NOTE — Assessment & Plan Note (Signed)
BP Readings from Last 3 Encounters:  03/28/23 128/74  03/20/23 (!) 158/80  07/29/22 (!) 172/82   Stable, pt to continue medical treatment norvasc 5 mg qd

## 2023-03-29 NOTE — Assessment & Plan Note (Signed)
Last vitamin D Lab Results  Component Value Date   VD25OH 36.45 07/29/2022   Low, to start oral replacement  

## 2023-03-31 ENCOUNTER — Ambulatory Visit: Payer: Medicare Other | Attending: Internal Medicine

## 2023-03-31 ENCOUNTER — Telehealth: Payer: Self-pay

## 2023-03-31 DIAGNOSIS — M25611 Stiffness of right shoulder, not elsewhere classified: Secondary | ICD-10-CM | POA: Diagnosis present

## 2023-03-31 DIAGNOSIS — R278 Other lack of coordination: Secondary | ICD-10-CM | POA: Diagnosis present

## 2023-03-31 DIAGNOSIS — R2681 Unsteadiness on feet: Secondary | ICD-10-CM | POA: Insufficient documentation

## 2023-03-31 DIAGNOSIS — I639 Cerebral infarction, unspecified: Secondary | ICD-10-CM | POA: Diagnosis not present

## 2023-03-31 DIAGNOSIS — M6281 Muscle weakness (generalized): Secondary | ICD-10-CM | POA: Diagnosis present

## 2023-03-31 DIAGNOSIS — R262 Difficulty in walking, not elsewhere classified: Secondary | ICD-10-CM | POA: Insufficient documentation

## 2023-03-31 DIAGNOSIS — R29898 Other symptoms and signs involving the musculoskeletal system: Secondary | ICD-10-CM

## 2023-03-31 DIAGNOSIS — R2689 Other abnormalities of gait and mobility: Secondary | ICD-10-CM | POA: Insufficient documentation

## 2023-03-31 DIAGNOSIS — M21371 Foot drop, right foot: Secondary | ICD-10-CM | POA: Diagnosis not present

## 2023-03-31 NOTE — Therapy (Signed)
OUTPATIENT PHYSICAL THERAPY NEURO EVALUATION   Patient Name: Ethan Boyd MRN: [id number] DOB:Oct 26, 1956, 67 y.o., male Today's Date: 03/31/2023   PCP: Oliver BarreJames John, MD REFERRING PROVIDER: Oliver BarreJames John, MD  END OF SESSION:  PT End of Session - 03/31/23 1402     Visit Number 1    Number of Visits 17    Date for PT Re-Evaluation 06/09/23    Authorization Type BCBS    PT Start Time 1403    PT Stop Time 1452    PT Time Calculation (min) 49 min    Equipment Utilized During Treatment Gait belt    Activity Tolerance Patient tolerated treatment well    Behavior During Therapy WFL for tasks assessed/performed             Past Medical History:  Diagnosis Date   DIABETES MELLITUS, TYPE II 02/18/2009   Qualifier: Diagnosis of  By: Jonny RuizJohn MD, Len BlalockJames W    Diverticulosis 08/31/2012   Colonoscopy, June 10, 2009, Dr Perry/GI   ERECTILE DYSFUNCTION 02/18/2009   Qualifier: Diagnosis of  By: Jonny RuizJohn MD, Len BlalockJames W    Erectile dysfunction 09/07/2012   HYPERLIPIDEMIA 02/18/2009   Qualifier: Diagnosis of  By: Jonny RuizJohn MD, Len BlalockJames W    HYPERTENSION 02/18/2009   Qualifier: Diagnosis of  By: Jonny RuizJohn MD, Len BlalockJames W    Past Surgical History:  Procedure Laterality Date   COLONOSCOPY  06/10/2009   right knee cartilage  12/26/1974   Patient Active Problem List   Diagnosis Date Noted   Acute stroke due to ischemia 03/28/2023   Right foot drop 03/28/2023   CVA (cerebral vascular accident) 03/19/2023   Vitamin D deficiency 07/31/2022   COPD (chronic obstructive pulmonary disease) 07/29/2022   Pulmonary nodule, left 08/09/2021   Abnormal physical evaluation 07/15/2021   Screen for colon cancer 07/15/2021   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   Type II diabetes mellitus with manifestations 07/15/2021   Hyperlipidemia LDL goal <70 07/15/2021   Tachycardia 07/15/2021   PVC (premature ventricular contraction) 07/15/2021   Insulin dependent type 2 diabetes mellitus 07/15/2021   Lung nodule 07/15/2021   EKG abnormality  09/07/2012   Erectile dysfunction 09/07/2012   Snoring 09/07/2012   Encounter for well adult exam with abnormal findings 08/31/2012   Diverticulosis 08/31/2012   Essential hypertension 02/18/2009    ONSET DATE: 03/19/23  REFERRING DIAG:  I63.9 (ICD-10-CM) - Acute stroke due to ischemia  M21.371 (ICD-10-CM) - Right foot drop    THERAPY DIAG:  Difficulty in walking, not elsewhere classified  Muscle weakness (generalized)  Other abnormalities of gait and mobility  Other lack of coordination  Rationale for Evaluation and Treatment: Rehabilitation  SUBJECTIVE:  SUBJECTIVE STATEMENT: Patient arrives to clinic with family, ambulating with no AD, but dtr carrying [facility] for him. States he had the stroke last Sunday, worsening noted 48 hours after returning home. CT revealed stroke extension. Patient family with multiple questions regarding prognosis after extension, onset of spasticity and general stroke prognosis.  Pt accompanied by: family member  PERTINENT HISTORY: HTN, COPD, insulin-dependent T2DM, HLD   PAIN:  Are you having pain? No  PRECAUTIONS: Fall  WEIGHT BEARING RESTRICTIONS: No  FALLS: Has patient fallen in last 6 months? No  LIVING ENVIRONMENT: Lives with: lives with their spouse Lives in: House/apartment Stairs: Yes: Internal: flight steps; on right going up and External: a few steps; on right going up Has following equipment at home: Single point cane, Walker - 2 wheeled, Wheelchair (manual), and Grab bars  PLOF: Independent driving, retired  PATIENT GOALS: "walk normal"   OBJECTIVE:   DIAGNOSTIC FINDINGS: 03/29/23 brain MRI:  IMPRESSION: Interval evolution of the now subacute infarct in the left paramedian pons. No new acute infarct or intracranial  hemorrhage.  COGNITION: Overall cognitive status: Within functional limits for tasks assessed   SENSATION: WFL  COORDINATION: Limited in R LE heel shin and figure 8  POSTURE: No Significant postural limitations   LOWER EXTREMITY MMT:   Grossly 3/5 on R LE   BED MOBILITY:  Reports no difficulty  TRANSFERS: Assistive device utilized: None  Sit to stand: SBA Stand to sit: SBA Chair to chair: SBA  GAIT: Gait pattern: step through pattern, decreased arm swing- Right, decreased stance time- Right, decreased hip/knee flexion- Right, decreased ankle dorsiflexion- Right, circumduction- Right, Right steppage, genu recurvatum- Right, decreased trunk rotation, trunk rotated posterior- Right, and narrow BOSvaulting on L  Distance walked: clinic Assistive device utilized: None Level of assistance: SBA and CGA  FUNCTIONAL TESTS:   [facility] PT Assessment - 03/31/23 0001       Standardized Balance Assessment   Standardized Balance Assessment 10 meter walk test    Five times sit to stand comments  15.22    10 Meter Walk .3m/s              PATIENT SURVEYS:  FOTO 52, expected to be at 74  TODAY'S TREATMENT:                                                                                                                              N/a eval   PATIENT EDUCATION: Education details: PT POC, exam findings, rehab prognosis, AFO vs ASO, tone vs spasticity, rehab prognostic implications of evolution of stroke Person educated: Patient, Spouse, and Child(ren) Education method: Explanation and Demonstration Education comprehension: verbalized understanding and needs further education  HOME EXERCISE PROGRAM: To be provided  GOALS: Goals reviewed with patient? Yes  SHORT TERM GOALS: Target date: 04/28/23  Pt will be independent with initial HEP for improved gait mechanics  Baseline: to be provided Goal status: INITIAL  2.  Pt will improve  gait speed to >/= .30m/s to demonstrate  improved community ambulation  Baseline: .72m/s Goal status: INITIAL  3.  Pt will improve 5x STS to </= 13 sec to demo improved functional LE strength and balance   Baseline: 15.22s B UE Goal status: INITIAL  4.  Berg goal  Baseline: to be assessed Goal status: INITIAL   LONG TERM GOALS: Target date: 06/09/23  Pt will be independent with final HEP for improved gait mechanics  Baseline: to be provided  Goal status: INITIAL  2.  Pt will improve gait speed to >/= .33m/s to demonstrate improved community ambulation  Baseline: .6m/s Goal status: INITIAL  3.  Pt will improve 5x STS to </= 10 sec to demo improved functional LE strength and balance   Baseline: 15.22s B UE Goal status: INITIAL  4.  Berg goal Baseline: to be assessed Goal status: INITIAL  5.  Patient will improve FOTO score to >/= 69 to demonstrate functional improvement Baseline: 52 Goal status: INITIAL  ASSESSMENT:  CLINICAL IMPRESSION: Patient is a 68 y.o. male who was seen today for physical therapy evaluation and treatment for gait impairment post stroke. Patient currently ambulating without an AD, but is safest with at least a cane at this point given gait deficits noted above. 10 Meter Walk Test: Patient instructed to walk 10 meters (32.8 ft) as quickly and as safely as possible at their normal speed x2 and at a fast speed x2. Time measured from 2 meter mark to 8 meter mark to accommodate ramp-up and ramp-down.  Normal speed: .14m/s Cut off scores: <0.4 m/s = household Ambulator, 0.4-0.8 m/s = limited community Ambulator, >0.8 m/s = community Ambulator, >1.2 m/s = crossing a street, <1.0 = increased fall risk MCID 0.05 m/s (small), 0.13 m/s (moderate), 0.06 m/s (significant)  (ANPTA Core Set of Outcome Measures for Adults with Neurologic Conditions, 2018). Five times Sit to Stand Test (FTSS) Method: Use a straight back chair with a solid seat that is 17-18" high. Ask participant to sit on the chair with  arms folded across their chest.   Instructions: "Stand up and sit down as quickly as possible 5 times, keeping your arms folded across your chest."   Measurement: Stop timing when the participant touches the chair in sitting the 5th time.  TIME: 15.22 sec B UE  Cut off scores indicative of increased fall risk: >12 sec CVA, >16 sec PD, >13 sec vestibular (ANPTA Core Set of Outcome Measures for Adults with Neurologic Conditions, 2018). He would benefit from skilled PT services to address the above mentioned deficits.    OBJECTIVE IMPAIRMENTS: Abnormal gait, decreased balance, decreased coordination, decreased endurance, decreased knowledge of condition, decreased knowledge of use of DME, decreased strength, impaired tone, and impaired UE functional use.   ACTIVITY LIMITATIONS: carrying, lifting, standing, squatting, stairs, transfers, locomotion level, and caring for others  PARTICIPATION LIMITATIONS: meal prep, cleaning, interpersonal relationship, driving, shopping, community activity, occupation, and yard work  PERSONAL FACTORS: Age, Fitness, Past/current experiences, Time since onset of injury/illness/exacerbation, Transportation, and 3+ comorbidities: see above  are also affecting patient's functional outcome.   REHAB POTENTIAL: Good  CLINICAL DECISION MAKING: Stable/uncomplicated  EVALUATION COMPLEXITY: Low  PLAN:  PT FREQUENCY: 2x/week  PT DURATION: 8 weeks  PLANNED INTERVENTIONS: Therapeutic exercises, Therapeutic activity, Neuromuscular re-education, Balance training, Gait training, Patient/Family education, Self Care, Joint mobilization, Stair training, Vestibular training, Canalith repositioning, Visual/preceptual remediation/compensation, Orthotic/Fit training, DME instructions, Aquatic Therapy, Dry Needling, Electrical stimulation, Taping, Manual therapy, and Re-evaluation  PLAN  FOR NEXT SESSION: berg, HEP, trial AFO   Westley FootsJennifer A Ameera Tigue, PT, DPT, CBIS 03/31/2023, 3:36  PM

## 2023-03-31 NOTE — Telephone Encounter (Signed)
Dr. Jonny Ruiz, Claris Pong was evaluated by PT on 03/31/23.  The patient would benefit from an OT evaluation for R UE weakness.    If you agree, please place an order in [facility]que in [facility] or fax the order to [phone].  Thank you, Westley Foots, PT, DPT, [facility] [address] [place], [place]  [address] Phone:  [phone] Fax:  [phone]

## 2023-03-31 NOTE — Telephone Encounter (Signed)
Ok this is done 

## 2023-04-01 ENCOUNTER — Encounter: Payer: Self-pay | Admitting: Internal Medicine

## 2023-04-03 ENCOUNTER — Ambulatory Visit: Payer: Medicare Other | Admitting: Physical Therapy

## 2023-04-03 DIAGNOSIS — M6281 Muscle weakness (generalized): Secondary | ICD-10-CM

## 2023-04-03 DIAGNOSIS — R262 Difficulty in walking, not elsewhere classified: Secondary | ICD-10-CM

## 2023-04-03 DIAGNOSIS — R2689 Other abnormalities of gait and mobility: Secondary | ICD-10-CM

## 2023-04-03 NOTE — Therapy (Signed)
OUTPATIENT PHYSICAL THERAPY NEURO TREATMENT   Patient Name: Ethan Boyd MRN: [id number] DOB:07-07-1956, 67 y.o., male Today's Date: 04/03/2023   PCP: Ethan Barre, MD REFERRING PROVIDER: Oliver Barre, MD  END OF SESSION:  PT End of Session - 04/03/23 1534     Visit Number 2    Number of Visits 17    Date for PT Re-Evaluation 06/09/23    Authorization Type BCBS    PT Start Time 1530    PT Stop Time 1615    PT Time Calculation (min) 45 min    Equipment Utilized During Treatment Gait belt    Activity Tolerance Patient tolerated treatment well    Behavior During Therapy WFL for tasks assessed/performed              Past Medical History:  Diagnosis Date   DIABETES MELLITUS, TYPE II 02/18/2009   Qualifier: Diagnosis of  By: Ethan Ruiz MD, Ethan Boyd    Diverticulosis 08/31/2012   Colonoscopy, June 10, 2009, Dr Ethan Boyd/GI   ERECTILE DYSFUNCTION 02/18/2009   Qualifier: Diagnosis of  By: Ethan Ruiz MD, Ethan Boyd    Erectile dysfunction 09/07/2012   HYPERLIPIDEMIA 02/18/2009   Qualifier: Diagnosis of  By: Ethan Ruiz MD, Ethan Boyd    HYPERTENSION 02/18/2009   Qualifier: Diagnosis of  By: Ethan Ruiz MD, Ethan Boyd    Past Surgical History:  Procedure Laterality Date   COLONOSCOPY  06/10/2009   right knee cartilage  12/26/1974   Patient Active Problem List   Diagnosis Date Noted   Acute stroke due to ischemia 03/28/2023   Right foot drop 03/28/2023   CVA (cerebral vascular accident) 03/19/2023   Vitamin D deficiency 07/31/2022   COPD (chronic obstructive pulmonary disease) 07/29/2022   Pulmonary nodule, left 08/09/2021   Abnormal physical evaluation 07/15/2021   Screen for colon cancer 07/15/2021   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   Type II diabetes mellitus with manifestations 07/15/2021   Hyperlipidemia LDL goal <70 07/15/2021   Tachycardia 07/15/2021   PVC (premature ventricular contraction) 07/15/2021   Insulin dependent type 2 diabetes mellitus 07/15/2021   Lung nodule 07/15/2021   EKG abnormality  09/07/2012   Erectile dysfunction 09/07/2012   Snoring 09/07/2012   Encounter for well adult exam with abnormal findings 08/31/2012   Diverticulosis 08/31/2012   Essential hypertension 02/18/2009    ONSET DATE: 03/19/23  REFERRING DIAG:  I63.9 (ICD-10-CM) - Acute stroke due to ischemia  M21.371 (ICD-10-CM) - Right foot drop    THERAPY DIAG:  Difficulty in walking, not elsewhere classified  Muscle weakness (generalized)  Other abnormalities of gait and mobility  Rationale for Evaluation and Treatment: Rehabilitation  SUBJECTIVE:  SUBJECTIVE STATEMENT:  Pt goes by "[name]"  Pt reports no falls, has had no falls since CVA. Pt reports some pain in his R toe from history of gout, was up on his feet a lot the past few days exercising. Pt reports he has been working on [employer] (holding for 5+ sec), toe raises, sit to stands with focus on equal WB through BLE, lunges, and wall push-ups. Pt reports he has noticed improved RLE control when descending stairs. Pt also brings in R AFO order from his PCP, will scan into chart.   Pt accompanied by: family member  PERTINENT HISTORY: HTN, COPD, insulin-dependent T2DM, HLD   PAIN:  Are you having pain? No  PRECAUTIONS: Fall  WEIGHT BEARING RESTRICTIONS: No  FALLS: Has patient fallen in last 6 months? No  LIVING ENVIRONMENT: Lives with: lives with their spouse Lives in: House/apartment Stairs: Yes: Internal: flight steps; on right going up and External: a few steps; on right going up Has following equipment at home: Single point cane, Walker - 2 wheeled, Wheelchair (manual), and Grab bars  PLOF: Independent driving, retired  PATIENT GOALS: "walk normal"   OBJECTIVE:   DIAGNOSTIC FINDINGS: 03/29/23 brain MRI:  IMPRESSION: Interval evolution of the now  subacute infarct in the left paramedian pons. No new acute infarct or intracranial hemorrhage.  COGNITION: Overall cognitive status: Within functional limits for tasks assessed      TODAY'S TREATMENT:                                                                                                                              TherAct  [facility] PT Assessment - 04/03/23 1542       Standardized Balance Assessment   Standardized Balance Assessment Berg Balance Test      Berg Balance Test   Sit to Stand Able to stand without using hands and stabilize independently    Standing Unsupported Able to stand safely 2 minutes    Sitting with Back Unsupported but Feet Supported on Floor or Stool Able to sit safely and securely 2 minutes    Stand to Sit Sits safely with minimal use of hands    Transfers Able to transfer safely, minor use of hands    Standing Unsupported with Eyes Closed Able to stand 10 seconds with supervision    Standing Unsupported with Feet Together Able to place feet together independently and stand for 1 minute with supervision    From Standing, Reach Forward with Outstretched Arm Can reach forward >5 cm safely (2")    From Standing Position, Pick up Object from Floor Able to pick up shoe, needs supervision    From Standing Position, Turn to Look Behind Over each Shoulder Turn sideways only but maintains balance    Turn 360 Degrees Able to turn 360 degrees safely one side only in 4 seconds or less    Standing Unsupported, Alternately Place Feet on Step/Stool Needs assistance to keep from falling  or unable to try    Standing Unsupported, One Foot in Front Needs help to step but can hold 15 seconds    Standing on One Leg Tries to lift leg/unable to hold 3 seconds but remains standing independently    Total Score 38    Berg comment: 38/56, significant fall risk             TherEx Standing alt L/R 4" step-taps x 10 reps with no UE support and min A due to posterior LOB. Added  to HEP but encouraged holding onto countertop for support.  Staggered sit to stand x 10 reps with focus on increased WB through RLE. Added to HEP, see bolded below.  Gait Gait pattern:  vaulting on LLE, step through pattern, decreased arm swing- Right, decreased stance time- Right, decreased hip/knee flexion- Right, decreased ankle dorsiflexion- Right, and circumduction- Right Distance walked: 115 ft x 2 Assistive device utilized: Single point cane and none Level of assistance: SBA and CGA Comments: increase in R toes catching with onset of fatigue; decreased balance without use of SPC    PATIENT EDUCATION: Education details: OM results and functional implications, types of AFOs and purpose, initiated HEP, prognosis for recovery Person educated: Patient Education method: Explanation, Demonstration, and Handouts Education comprehension: verbalized understanding and needs further education  HOME EXERCISE PROGRAM: Access Code: [email or web address] URL: [email or web address] Date: 04/03/2023 Prepared by: Peter Congoaylor Corine Solorio  Exercises - Alternating Step Taps with Counter Support  - 1 x daily - 7 x weekly - 3 sets - 10 reps - Staggered Sit-to-Stand  - 1 x daily - 7 x weekly - 3 sets - 10 reps   GOALS: Goals reviewed with patient? Yes  SHORT TERM GOALS: Target date: 04/28/23  Pt will be independent with initial HEP for improved gait mechanics  Baseline: to be provided Goal status: INITIAL  2.  Pt will improve gait speed to >/= .5542m/s to demonstrate improved community ambulation  Baseline: .2580m/s Goal status: INITIAL  3.  Pt will improve 5x STS to </= 13 sec to demo improved functional LE strength and balance   Baseline: 15.22s B UE Goal status: INITIAL  4.  Pt will improve Berg score to 42/56 for decreased fall risk   Baseline: 38/56 (4/8) Goal status: INITIAL   LONG TERM GOALS: Target date: 06/09/23  Pt will be independent with final HEP for improved gait mechanics   Baseline: to be provided  Goal status: INITIAL  2.  Pt will improve gait speed to >/= .1549m/s to demonstrate improved community ambulation  Baseline: .4480m/s Goal status: INITIAL  3.  Pt will improve 5x STS to </= 10 sec to demo improved functional LE strength and balance   Baseline: 15.22s B UE Goal status: INITIAL  4.  Pt will improve Berg score to 45/56 for decreased fall risk  Baseline: 38/56 (4/8) Goal status: INITIAL  5.  Patient will improve FOTO score to >/= 69 to demonstrate functional improvement Baseline: 52 Goal status: INITIAL  ASSESSMENT:  CLINICAL IMPRESSION: Emphasis of skilled PT session on assessing Berg and writing STG/LTG as well as initiating HEP for RLE strengthening and balance. Pt continues to exhibit gait impairments as noted above leading to decreased balance and safety with functional mobility. Pt exhibits increased fall risk based on score of 38/56 on BBS. Pt continues to benefit from skilled therapy services to address R hemibody weakness leading to impaired balance and strength and therefore increased fall risk. Continue POC.    OBJECTIVE IMPAIRMENTS: Abnormal  gait, decreased balance, decreased coordination, decreased endurance, decreased knowledge of condition, decreased knowledge of use of DME, decreased strength, impaired tone, and impaired UE functional use.   ACTIVITY LIMITATIONS: carrying, lifting, standing, squatting, stairs, transfers, locomotion level, and caring for others  PARTICIPATION LIMITATIONS: meal prep, cleaning, interpersonal relationship, driving, shopping, community activity, occupation, and yard work  PERSONAL FACTORS: Age, Fitness, Past/current experiences, Time since onset of injury/illness/exacerbation, Transportation, and 3+ comorbidities: see above  are also affecting patient's functional outcome.   REHAB POTENTIAL: Good  CLINICAL DECISION MAKING: Stable/uncomplicated  EVALUATION COMPLEXITY: Low  PLAN:  PT FREQUENCY:  2x/week  PT DURATION: 8 weeks  PLANNED INTERVENTIONS: Therapeutic exercises, Therapeutic activity, Neuromuscular re-education, Balance training, Gait training, Patient/Family education, Self Care, Joint mobilization, Stair training, Vestibular training, Canalith repositioning, Visual/preceptual remediation/compensation, Orthotic/Fit training, DME instructions, Aquatic Therapy, Dry Needling, Electrical stimulation, Taping, Manual therapy, and Re-evaluation  PLAN FOR NEXT SESSION: add to HEP, trial AFO (foot up brace vs AFO)--order received and scanned into computer   Peter Congo, PT, DPT, CSRS 04/03/2023, 4:20 PM

## 2023-04-03 NOTE — Telephone Encounter (Signed)
Patient dropped off document Handicap Placard, to be filled out by provider. Patient requested to send it via Call Patient to pick up within 2-days. Document is located in providers tray at front office.Please advise at [facility] [phone]

## 2023-04-05 ENCOUNTER — Ambulatory Visit: Payer: Medicare Other

## 2023-04-05 DIAGNOSIS — R2689 Other abnormalities of gait and mobility: Secondary | ICD-10-CM

## 2023-04-05 DIAGNOSIS — M6281 Muscle weakness (generalized): Secondary | ICD-10-CM

## 2023-04-05 DIAGNOSIS — R278 Other lack of coordination: Secondary | ICD-10-CM

## 2023-04-05 DIAGNOSIS — R262 Difficulty in walking, not elsewhere classified: Secondary | ICD-10-CM | POA: Diagnosis not present

## 2023-04-05 NOTE — Therapy (Signed)
OUTPATIENT PHYSICAL THERAPY NEURO TREATMENT   Patient Name: Aqib Stock MRN: [id number] DOB:11-Apr-1956, 67 y.o., male Today's Date: 04/05/2023   PCP: Oliver Barre, MD REFERRING PROVIDER: Oliver Barre, MD  END OF SESSION:  PT End of Session - 04/05/23 1438     Visit Number 3    Number of Visits 17    Date for PT Re-Evaluation 06/09/23    Authorization Type BCBS    PT Start Time 1445    PT Stop Time 1532    PT Time Calculation (min) 47 min    Equipment Utilized During Treatment Gait belt    Activity Tolerance Patient tolerated treatment well    Behavior During Therapy WFL for tasks assessed/performed              Past Medical History:  Diagnosis Date   DIABETES MELLITUS, TYPE II 02/18/2009   Qualifier: Diagnosis of  By: Jonny Ruiz MD, Len Blalock    Diverticulosis 08/31/2012   Colonoscopy, June 10, 2009, Dr Perry/GI   ERECTILE DYSFUNCTION 02/18/2009   Qualifier: Diagnosis of  By: Jonny Ruiz MD, Len Blalock    Erectile dysfunction 09/07/2012   HYPERLIPIDEMIA 02/18/2009   Qualifier: Diagnosis of  By: Jonny Ruiz MD, Len Blalock    HYPERTENSION 02/18/2009   Qualifier: Diagnosis of  By: Jonny Ruiz MD, Len Blalock    Past Surgical History:  Procedure Laterality Date   COLONOSCOPY  06/10/2009   right knee cartilage  12/26/1974   Patient Active Problem List   Diagnosis Date Noted   Acute stroke due to ischemia 03/28/2023   Right foot drop 03/28/2023   CVA (cerebral vascular accident) 03/19/2023   Vitamin D deficiency 07/31/2022   COPD (chronic obstructive pulmonary disease) 07/29/2022   Pulmonary nodule, left 08/09/2021   Abnormal physical evaluation 07/15/2021   Screen for colon cancer 07/15/2021   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   Type II diabetes mellitus with manifestations 07/15/2021   Hyperlipidemia LDL goal <70 07/15/2021   Tachycardia 07/15/2021   PVC (premature ventricular contraction) 07/15/2021   Insulin dependent type 2 diabetes mellitus 07/15/2021   Lung nodule 07/15/2021   EKG abnormality  09/07/2012   Erectile dysfunction 09/07/2012   Snoring 09/07/2012   Encounter for well adult exam with abnormal findings 08/31/2012   Diverticulosis 08/31/2012   Essential hypertension 02/18/2009    ONSET DATE: 03/19/23  REFERRING DIAG:  I63.9 (ICD-10-CM) - Acute stroke due to ischemia  M21.371 (ICD-10-CM) - Right foot drop    THERAPY DIAG:  Difficulty in walking, not elsewhere classified  Muscle weakness (generalized)  Other abnormalities of gait and mobility  Other lack of coordination  Rationale for Evaluation and Treatment: Rehabilitation  SUBJECTIVE:  SUBJECTIVE STATEMENT:  Pt goes by "GreeceBubba"  Patient reports "I'm doing better." Noticing improved RUE function. Improving movement/strength in R LE. Denies falls/near falls. Not using AD in the house and only uses AD outside 50/50.   Pt accompanied by: family member  PERTINENT HISTORY: HTN, COPD, insulin-dependent T2DM, HLD   PAIN:  Are you having pain? No  PRECAUTIONS: Fall  PATIENT GOALS: "walk normal"   OBJECTIVE:   DIAGNOSTIC FINDINGS: 03/29/23 brain MRI:  IMPRESSION: Interval evolution of the now subacute infarct in the left paramedian pons. No new acute infarct or intracranial hemorrhage.  TODAY'S TREATMENT:                                                                                                                              NMR:  -R LE D2 to 1st step, progressed to 2nd step L UE support on HR -R LE on bottom bar of rolling table knee flex/extend   -standing on R LE with PT blocking R knee into extension  -hip + knee flexion over kettlebell   Gait -115' AFO minimize vaulting, but remains with decreased adequate knee flexion through swing phase  -4x20' mirror feedback on gait pattern and posture     PATIENT  EDUCATION: Education details: additions to HEP, purpose of various exercises and AFO, recovery prognosis Person educated: Patient Education method: Explanation, Demonstration, and Handouts Education comprehension: verbalized understanding and needs further education  HOME EXERCISE PROGRAM: Access Code: [email or web address] URL: [email or web address] Date: 04/03/2023 Prepared by: Peter Congoaylor [place]  Exercises - Alternating Step Taps with Counter Support  - 1 x daily - 7 x weekly - 3 sets - 10 reps - Staggered Sit-to-Stand  - 1 x daily - 7 x weekly - 3 sets - 10 reps -Left arm supported on hand rail at stairs: lift your R foot onto the 1st step tapping your heel onto the step   -think "up and on" then "up and back" -lifting R leg up and over dumbbell or can of soup from a seated position   -emphasis on up and over and not around   GOALS: Goals reviewed with patient? Yes  SHORT TERM GOALS: Target date: 04/28/23  Pt will be independent with initial HEP for improved gait mechanics  Baseline: to be provided Goal status: INITIAL  2.  Pt will improve gait speed to >/= .4861m/s to demonstrate improved community ambulation  Baseline: .5865m/s Goal status: INITIAL  3.  Pt will improve 5x STS to </= 13 sec to demo improved functional LE strength and balance   Baseline: 15.22s B UE Goal status: INITIAL  4.  Pt will improve Berg score to 42/56 for decreased fall risk   Baseline: 38/56 (4/8) Goal status: INITIAL   LONG TERM GOALS: Target date: 06/09/23  Pt will be independent with final HEP for improved gait mechanics  Baseline: to be provided  Goal status: INITIAL  2.  Pt will improve gait speed to >/= .3869m/s  to demonstrate improved community ambulation  Baseline: .18m/s Goal status: INITIAL  3.  Pt will improve 5x STS to </= 10 sec to demo improved functional LE strength and balance   Baseline: 15.22s B UE Goal status: INITIAL  4.  Pt will improve Berg score to 45/56 for  decreased fall risk  Baseline: 38/56 (4/8) Goal status: INITIAL  5.  Patient will improve FOTO score to >/= 69 to demonstrate functional improvement Baseline: 52 Goal status: INITIAL  ASSESSMENT:  CLINICAL IMPRESSION: Patient seen for skilled PT session with emphasis on R hemibody NMR and gait training with AFO. Patient with well developed compensatory strategies for decreased R knee flexion, decreased R hip flexion and decreased R dorsiflexion through swing phase. Even with use of AFO, patient remains with some vaulting on L for increased foot clearance to compensate for decreased knee flexion and hip flexors. Patient with questionable insight into deficits reporting good balance, but exam results and need for full guarding during session suggest otherwise. Continue POC.     OBJECTIVE IMPAIRMENTS: Abnormal gait, decreased balance, decreased coordination, decreased endurance, decreased knowledge of condition, decreased knowledge of use of DME, decreased strength, impaired tone, and impaired UE functional use.   ACTIVITY LIMITATIONS: carrying, lifting, standing, squatting, stairs, transfers, locomotion level, and caring for others  PARTICIPATION LIMITATIONS: meal prep, cleaning, interpersonal relationship, driving, shopping, community activity, occupation, and yard work  PERSONAL FACTORS: Age, Fitness, Past/current experiences, Time since onset of injury/illness/exacerbation, Transportation, and 3+ comorbidities: see above  are also affecting patient's functional outcome.   REHAB POTENTIAL: Good  CLINICAL DECISION MAKING: Stable/uncomplicated  EVALUATION COMPLEXITY: Low  PLAN:  PT FREQUENCY: 2x/week  PT DURATION: 8 weeks  PLANNED INTERVENTIONS: Therapeutic exercises, Therapeutic activity, Neuromuscular re-education, Balance training, Gait training, Patient/Family education, Self Care, Joint mobilization, Stair training, Vestibular training, Canalith repositioning, Visual/preceptual  remediation/compensation, Orthotic/Fit training, DME instructions, Aquatic Therapy, Dry Needling, Electrical stimulation, Taping, Manual therapy, and Re-evaluation  PLAN FOR NEXT SESSION: add to HEP, gait with SPC, minimizing compensatory strategies   Westley Foots, PT, DPT, CBIS 04/05/2023, 3:55 PM

## 2023-04-11 ENCOUNTER — Ambulatory Visit: Payer: Medicare Other

## 2023-04-11 DIAGNOSIS — R262 Difficulty in walking, not elsewhere classified: Secondary | ICD-10-CM

## 2023-04-11 DIAGNOSIS — M6281 Muscle weakness (generalized): Secondary | ICD-10-CM

## 2023-04-11 DIAGNOSIS — R278 Other lack of coordination: Secondary | ICD-10-CM

## 2023-04-11 DIAGNOSIS — R2689 Other abnormalities of gait and mobility: Secondary | ICD-10-CM

## 2023-04-11 NOTE — Therapy (Signed)
OUTPATIENT PHYSICAL THERAPY NEURO TREATMENT   Patient Name: Ethan Boyd MRN: [id number] DOB:02-22-1956, 67 y.o., male Today's Date: 04/11/2023   PCP: Oliver Barre, MD REFERRING PROVIDER: Oliver Barre, MD  END OF SESSION:  PT End of Session - 04/11/23 1405     Visit Number 4    Number of Visits 17    Date for PT Re-Evaluation 06/09/23    Authorization Type BCBS    PT Start Time 1402    PT Stop Time 1442    PT Time Calculation (min) 40 min    Equipment Utilized During Treatment Gait belt    Activity Tolerance Patient tolerated treatment well    Behavior During Therapy WFL for tasks assessed/performed              Past Medical History:  Diagnosis Date   DIABETES MELLITUS, TYPE II 02/18/2009   Qualifier: Diagnosis of  By: Jonny Ruiz MD, Len Blalock    Diverticulosis 08/31/2012   Colonoscopy, June 10, 2009, Dr Perry/GI   ERECTILE DYSFUNCTION 02/18/2009   Qualifier: Diagnosis of  By: Jonny Ruiz MD, Len Blalock    Erectile dysfunction 09/07/2012   HYPERLIPIDEMIA 02/18/2009   Qualifier: Diagnosis of  By: Jonny Ruiz MD, Len Blalock    HYPERTENSION 02/18/2009   Qualifier: Diagnosis of  By: Jonny Ruiz MD, Len Blalock    Past Surgical History:  Procedure Laterality Date   COLONOSCOPY  06/10/2009   right knee cartilage  12/26/1974   Patient Active Problem List   Diagnosis Date Noted   Acute stroke due to ischemia 03/28/2023   Right foot drop 03/28/2023   CVA (cerebral vascular accident) 03/19/2023   Vitamin D deficiency 07/31/2022   COPD (chronic obstructive pulmonary disease) 07/29/2022   Pulmonary nodule, left 08/09/2021   Abnormal physical evaluation 07/15/2021   Screen for colon cancer 07/15/2021   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   Type II diabetes mellitus with manifestations 07/15/2021   Hyperlipidemia LDL goal <70 07/15/2021   Tachycardia 07/15/2021   PVC (premature ventricular contraction) 07/15/2021   Insulin dependent type 2 diabetes mellitus 07/15/2021   Lung nodule 07/15/2021   EKG abnormality  09/07/2012   Erectile dysfunction 09/07/2012   Snoring 09/07/2012   Encounter for well adult exam with abnormal findings 08/31/2012   Diverticulosis 08/31/2012   Essential hypertension 02/18/2009    ONSET DATE: 03/19/23  REFERRING DIAG:  I63.9 (ICD-10-CM) - Acute stroke due to ischemia  M21.371 (ICD-10-CM) - Right foot drop    THERAPY DIAG:  Difficulty in walking, not elsewhere classified  Muscle weakness (generalized)  Other abnormalities of gait and mobility  Other lack of coordination  Rationale for Evaluation and Treatment: Rehabilitation  SUBJECTIVE:  SUBJECTIVE STATEMENT:  Patient reports doing well. Did go out of town this weekend and used the wheelchair a majority of the time. Denies falls/near falls. Reports feeling as though his R leg is not circumducting as much. Has been using the SPC ~60% of the time.   Pt accompanied by: family member  PERTINENT HISTORY: HTN, COPD, insulin-dependent T2DM, HLD   PAIN:  Are you having pain? No  PRECAUTIONS: Fall  PATIENT GOALS: "walk normal"   OBJECTIVE:   DIAGNOSTIC FINDINGS: 03/29/23 brain MRI:  IMPRESSION: Interval evolution of the now subacute infarct in the left paramedian pons. No new acute infarct or intracranial hemorrhage.  TODAY'S TREATMENT:                                                                                                                              NMR:  -minisquats x12, added green theraband proximal to B knees for improved hip engagement, added 10# kettlebell for added challenge -rockerboard A/P E/O, EC, smooth a/p weight shifts EO /EC  -anterior/posterior step on/off rockerboard with U LE anchored on the board   -needing at least U UE support  -modified thomas stretch to L hip to allow for longer terminal  stance -noted gait pattern:  Decreased vaulting on L, decreased circumduction on R, noted improved heel contact on L, but with compensatory L knee flexion to allow R heel to contact the floor   PATIENT EDUCATION: Education details: additions to HEP Person educated: Patient Education method: Programmer, multimedia, Demonstration, and Handouts Education comprehension: verbalized understanding and needs further education  HOME EXERCISE PROGRAM: Access Code: [email or web address] URL: [email or web address] Date: 04/03/2023 Prepared by: Peter Congo  Exercises - Alternating Step Taps with Counter Support  - 1 x daily - 7 x weekly - 3 sets - 10 reps - Staggered Sit-to-Stand  - 1 x daily - 7 x weekly - 3 sets - 10 reps -Left arm supported on hand rail at stairs: lift your R foot onto the 1st step tapping your heel onto the step   -think "up and on" then "up and back" -lifting R leg up and over dumbbell or can of soup from a seated position   -emphasis on up and over and not around - Modified Thomas Stretch  - 1 x daily - 7 x weekly - 3 sets - 30-45s hold -standing lifting R toes off the floor    GOALS: Goals reviewed with patient? Yes  SHORT TERM GOALS: Target date: 04/28/23  Pt will be independent with initial HEP for improved gait mechanics  Baseline: to be provided Goal status: INITIAL  2.  Pt will improve gait speed to >/= .52m/s to demonstrate improved community ambulation  Baseline: .31m/s Goal status: INITIAL  3.  Pt will improve 5x STS to </= 13 sec to demo improved functional LE strength and balance   Baseline: 15.22s B UE Goal status: INITIAL  4.  Pt will improve Berg score to 42/56 for decreased  fall risk   Baseline: 38/56 (4/8) Goal status: INITIAL   LONG TERM GOALS: Target date: 06/09/23  Pt will be independent with final HEP for improved gait mechanics  Baseline: to be provided  Goal status: INITIAL  2.  Pt will improve gait speed to >/= .35m/s to demonstrate  improved community ambulation  Baseline: .5m/s Goal status: INITIAL  3.  Pt will improve 5x STS to </= 10 sec to demo improved functional LE strength and balance   Baseline: 15.22s B UE Goal status: INITIAL  4.  Pt will improve Berg score to 45/56 for decreased fall risk  Baseline: 38/56 (4/8) Goal status: INITIAL  5.  Patient will improve FOTO score to >/= 69 to demonstrate functional improvement Baseline: 52 Goal status: INITIAL  ASSESSMENT:  CLINICAL IMPRESSION: Patient seen for skilled PT session with emphasis on R hemibody NMR. Improving gait mechanics, but with new compensatory strategies noted. Noted probably R LE extensor tone and only minimal balance strategies with R LE, noted preference for using L LE or UE on stable surface. Continue POC.     OBJECTIVE IMPAIRMENTS: Abnormal gait, decreased balance, decreased coordination, decreased endurance, decreased knowledge of condition, decreased knowledge of use of DME, decreased strength, impaired tone, and impaired UE functional use.   ACTIVITY LIMITATIONS: carrying, lifting, standing, squatting, stairs, transfers, locomotion level, and caring for others  PARTICIPATION LIMITATIONS: meal prep, cleaning, interpersonal relationship, driving, shopping, community activity, occupation, and yard work  PERSONAL FACTORS: Age, Fitness, Past/current experiences, Time since onset of injury/illness/exacerbation, Transportation, and 3+ comorbidities: see above  are also affecting patient's functional outcome.   REHAB POTENTIAL: Good  CLINICAL DECISION MAKING: Stable/uncomplicated  EVALUATION COMPLEXITY: Low  PLAN:  PT FREQUENCY: 2x/week  PT DURATION: 8 weeks  PLANNED INTERVENTIONS: Therapeutic exercises, Therapeutic activity, Neuromuscular re-education, Balance training, Gait training, Patient/Family education, Self Care, Joint mobilization, Stair training, Vestibular training, Canalith repositioning, Visual/preceptual  remediation/compensation, Orthotic/Fit training, DME instructions, Aquatic Therapy, Dry Needling, Electrical stimulation, Taping, Manual therapy, and Re-evaluation  PLAN FOR NEXT SESSION: add to HEP, gait with SPC, minimizing compensatory strategies, use of AFO?   Westley Foots, PT, DPT, CBIS 04/11/2023, 3:24 PM

## 2023-04-14 ENCOUNTER — Ambulatory Visit: Payer: Medicare Other

## 2023-04-14 DIAGNOSIS — M6281 Muscle weakness (generalized): Secondary | ICD-10-CM

## 2023-04-14 DIAGNOSIS — R278 Other lack of coordination: Secondary | ICD-10-CM

## 2023-04-14 DIAGNOSIS — R2689 Other abnormalities of gait and mobility: Secondary | ICD-10-CM

## 2023-04-14 DIAGNOSIS — R262 Difficulty in walking, not elsewhere classified: Secondary | ICD-10-CM

## 2023-04-14 NOTE — Therapy (Signed)
OUTPATIENT PHYSICAL THERAPY NEURO TREATMENT   Patient Name: Ugonna Keirsey MRN: [id number] DOB:09-25-1956, 67 y.o., male Today's Date: 04/14/2023   PCP: Oliver Barre, MD REFERRING PROVIDER: Oliver Barre, MD  END OF SESSION:  PT End of Session - 04/14/23 1404     Visit Number 5    Number of Visits 17    Date for PT Re-Evaluation 06/09/23    Authorization Type BCBS    PT Start Time 1402    PT Stop Time 1445    PT Time Calculation (min) 43 min    Activity Tolerance Patient tolerated treatment well              Past Medical History:  Diagnosis Date   DIABETES MELLITUS, TYPE II 02/18/2009   Qualifier: Diagnosis of  By: Jonny Ruiz MD, Len Blalock    Diverticulosis 08/31/2012   Colonoscopy, June 10, 2009, Dr Perry/GI   ERECTILE DYSFUNCTION 02/18/2009   Qualifier: Diagnosis of  By: Jonny Ruiz MD, Len Blalock    Erectile dysfunction 09/07/2012   HYPERLIPIDEMIA 02/18/2009   Qualifier: Diagnosis of  By: Jonny Ruiz MD, Len Blalock    HYPERTENSION 02/18/2009   Qualifier: Diagnosis of  By: Jonny Ruiz MD, Len Blalock    Past Surgical History:  Procedure Laterality Date   COLONOSCOPY  06/10/2009   right knee cartilage  12/26/1974   Patient Active Problem List   Diagnosis Date Noted   Acute stroke due to ischemia 03/28/2023   Right foot drop 03/28/2023   CVA (cerebral vascular accident) 03/19/2023   Vitamin D deficiency 07/31/2022   COPD (chronic obstructive pulmonary disease) 07/29/2022   Pulmonary nodule, left 08/09/2021   Abnormal physical evaluation 07/15/2021   Screen for colon cancer 07/15/2021   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   Type II diabetes mellitus with manifestations 07/15/2021   Hyperlipidemia LDL goal <70 07/15/2021   Tachycardia 07/15/2021   PVC (premature ventricular contraction) 07/15/2021   Insulin dependent type 2 diabetes mellitus 07/15/2021   Lung nodule 07/15/2021   EKG abnormality 09/07/2012   Erectile dysfunction 09/07/2012   Snoring 09/07/2012   Encounter for well adult exam with  abnormal findings 08/31/2012   Diverticulosis 08/31/2012   Essential hypertension 02/18/2009    ONSET DATE: 03/19/23  REFERRING DIAG:  I63.9 (ICD-10-CM) - Acute stroke due to ischemia  M21.371 (ICD-10-CM) - Right foot drop    THERAPY DIAG:  Difficulty in walking, not elsewhere classified  Muscle weakness (generalized)  Other abnormalities of gait and mobility  Other lack of coordination  Rationale for Evaluation and Treatment: Rehabilitation  SUBJECTIVE:  SUBJECTIVE STATEMENT: Patient reports doing well. Feels as though walking is going well. Denies falls/near falls.   Pt accompanied by: family member  PERTINENT HISTORY: HTN, COPD, insulin-dependent T2DM, HLD   PAIN:  Are you having pain? No  PRECAUTIONS: Fall  PATIENT GOALS: "walk normal"   OBJECTIVE:   DIAGNOSTIC FINDINGS: 03/29/23 brain MRI:  IMPRESSION: Interval evolution of the now subacute infarct in the left paramedian pons. No new acute infarct or intracranial hemorrhage.  TODAY'S TREATMENT:                                                                                                                              NMR:  -Nustep level 5 x10 mins B LE only for neural priming -tall kneel-> half kneel R LE transition with U UE/B UE support   -increased challenge with posterior transition - tall kneeling -> sitting on heels with B UE support   -added green theraband for resistance -standing hamstring curls with B UE support   -noted L lateral lean and hip flexion to compensate    PATIENT EDUCATION: Education details: additions to HEP Person educated: Patient Education method: Explanation, Demonstration, and Handouts Education comprehension: verbalized understanding and needs further education  HOME EXERCISE  PROGRAM: Access Code: [email or web address] URL: [email or web address] Date: 04/03/2023 Prepared by: Peter Congo  Exercises - Alternating Step Taps with Counter Support  - 1 x daily - 7 x weekly - 3 sets - 10 reps - Staggered Sit-to-Stand  - 1 x daily - 7 x weekly - 3 sets - 10 reps -Left arm supported on hand rail at stairs: lift your R foot onto the 1st step tapping your heel onto the step   -think "up and on" then "up and back" -lifting R leg up and over dumbbell or can of soup from a seated position   -emphasis on up and over and not around - Modified [employer]  - 1 x daily - 7 x weekly - 3 sets - 30-45s hold -standing lifting R toes off the floor  - Standing Knee Flexion AROM with Chair Support  - 1 x daily - 7 x weekly - 3 sets - 10 reps   GOALS: Goals reviewed with patient? Yes  SHORT TERM GOALS: Target date: 04/28/23  Pt will be independent with initial HEP for improved gait mechanics  Baseline: to be provided Goal status: INITIAL  2.  Pt will improve gait speed to >/= .64m/s to demonstrate improved community ambulation  Baseline: .62m/s Goal status: INITIAL  3.  Pt will improve 5x STS to </= 13 sec to demo improved functional LE strength and balance   Baseline: 15.22s B UE Goal status: INITIAL  4.  Pt will improve Berg score to 42/56 for decreased fall risk   Baseline: 38/56 (4/8) Goal status: INITIAL   LONG TERM GOALS: Target date: 06/09/23  Pt will be independent with final HEP for improved gait mechanics  Baseline: to be provided  Goal status: INITIAL  2.  Pt will improve gait speed to >/= .52m/s to demonstrate improved community ambulation  Baseline: .81m/s Goal status: INITIAL  3.  Pt will improve 5x STS to </= 10 sec to demo improved functional LE strength and balance   Baseline: 15.22s B UE Goal status: INITIAL  4.  Pt will improve Berg score to 45/56 for decreased fall risk  Baseline: 38/56 (4/8) Goal status: INITIAL  5.  Patient  will improve FOTO score to >/= 69 to demonstrate functional improvement Baseline: 52 Goal status: INITIAL  ASSESSMENT:  CLINICAL IMPRESSION: Patient seen for skilled PT session with emphasis on R NMR. Minimizing compensatory gait strategies noted, but do persist. Focus on moving outside of extensor tone pattern while completing functional strengthening tasks. Patient with greatest challenge completing standing hamstring curl without excessive compensatory strategies. Continue POC.     OBJECTIVE IMPAIRMENTS: Abnormal gait, decreased balance, decreased coordination, decreased endurance, decreased knowledge of condition, decreased knowledge of use of DME, decreased strength, impaired tone, and impaired UE functional use.   ACTIVITY LIMITATIONS: carrying, lifting, standing, squatting, stairs, transfers, locomotion level, and caring for others  PARTICIPATION LIMITATIONS: meal prep, cleaning, interpersonal relationship, driving, shopping, community activity, occupation, and yard work  PERSONAL FACTORS: Age, Fitness, Past/current experiences, Time since onset of injury/illness/exacerbation, Transportation, and 3+ comorbidities: see above  are also affecting patient's functional outcome.   REHAB POTENTIAL: Good  CLINICAL DECISION MAKING: Stable/uncomplicated  EVALUATION COMPLEXITY: Low  PLAN:  PT FREQUENCY: 2x/week  PT DURATION: 8 weeks  PLANNED INTERVENTIONS: Therapeutic exercises, Therapeutic activity, Neuromuscular re-education, Balance training, Gait training, Patient/Family education, Self Care, Joint mobilization, Stair training, Vestibular training, Canalith repositioning, Visual/preceptual remediation/compensation, Orthotic/Fit training, DME instructions, Aquatic Therapy, Dry Needling, Electrical stimulation, Taping, Manual therapy, and Re-evaluation  PLAN FOR NEXT SESSION: add to HEP, gait with SPC, minimizing compensatory strategies, use of AFO? quadruped   Westley Foots, PT,  DPT, CBIS 04/14/2023, 3:49 PM

## 2023-04-18 ENCOUNTER — Ambulatory Visit: Payer: Medicare Other

## 2023-04-18 ENCOUNTER — Other Ambulatory Visit: Payer: Self-pay

## 2023-04-18 DIAGNOSIS — M6281 Muscle weakness (generalized): Secondary | ICD-10-CM

## 2023-04-18 DIAGNOSIS — R262 Difficulty in walking, not elsewhere classified: Secondary | ICD-10-CM

## 2023-04-18 DIAGNOSIS — R2689 Other abnormalities of gait and mobility: Secondary | ICD-10-CM

## 2023-04-18 DIAGNOSIS — M25611 Stiffness of right shoulder, not elsewhere classified: Secondary | ICD-10-CM

## 2023-04-18 DIAGNOSIS — R278 Other lack of coordination: Secondary | ICD-10-CM

## 2023-04-18 DIAGNOSIS — R2681 Unsteadiness on feet: Secondary | ICD-10-CM

## 2023-04-18 NOTE — Therapy (Deleted)
OUTPATIENT OCCUPATIONAL THERAPY NEURO EVALUATION  Patient Name: Ethan Boyd MRN: [id number] DOB:Aug 15, 1956, 67 y.o., male Today's Date: 04/18/2023  PCP: Dr. Oliver Barre REFERRING PROVIDER: Dr. Oliver Barre  END OF SESSION:   Past Medical History:  Diagnosis Date   DIABETES MELLITUS, TYPE II 02/18/2009   Qualifier: Diagnosis of  By: Jonny Ruiz MD, Len Blalock    Diverticulosis 08/31/2012   Colonoscopy, June 10, 2009, Dr Perry/GI   ERECTILE DYSFUNCTION 02/18/2009   Qualifier: Diagnosis of  By: Jonny Ruiz MD, Len Blalock    Erectile dysfunction 09/07/2012   HYPERLIPIDEMIA 02/18/2009   Qualifier: Diagnosis of  By: Jonny Ruiz MD, Len Blalock    HYPERTENSION 02/18/2009   Qualifier: Diagnosis of  By: Jonny Ruiz MD, Len Blalock    Past Surgical History:  Procedure Laterality Date   COLONOSCOPY  06/10/2009   right knee cartilage  12/26/1974   Patient Active Problem List   Diagnosis Date Noted   Acute stroke due to ischemia 03/28/2023   Right foot drop 03/28/2023   CVA (cerebral vascular accident) 03/19/2023   Vitamin D deficiency 07/31/2022   COPD (chronic obstructive pulmonary disease) 07/29/2022   Pulmonary nodule, left 08/09/2021   Abnormal physical evaluation 07/15/2021   Screen for colon cancer 07/15/2021   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  Rationale for Evaluation and Treatment: Rehabilitation  SUBJECTIVE:   SUBJECTIVE  STATEMENT: *** Pt accompanied by: {accompnied:27141}  PERTINENT HISTORY: ***  PRECAUTIONS: {Therapy precautions:24002}  WEIGHT BEARING RESTRICTIONS: {Yes ***/No:24003}  PAIN:  Are you having pain? {OPRCPAIN:27236}  FALLS: Has patient fallen in last 6 months? {fallsyesno:27318}  LIVING ENVIRONMENT: Lives with: {OPRC lives with:25569::"lives with their family"} Lives in: {Lives in:25570} Stairs: {opstairs:27293} Has following equipment at home: {Assistive devices:23999}  PLOF: {PLOF:24004}  PATIENT GOALS: ***  OBJECTIVE:   HAND DOMINANCE: {MISC; OT HAND DOMINANCE:[phone]}  ADLs: Overall ADLs: *** Transfers/ambulation related to ADLs: Eating: *** Grooming: *** UB Dressing: *** LB Dressing: *** Toileting: *** Bathing: *** Tub Shower transfers: *** Equipment: {equipment:25573}  IADLs: Shopping: *** Light housekeeping: *** Meal Prep: *** Community mobility: *** Medication management: *** Financial management: *** Handwriting: {OTWRITTENEXPRESSION:25361}  MOBILITY STATUS: {OTMOBILITY:25360}  POSTURE COMMENTS:  {posture:25561} Sitting balance: {sitting balance:25483}  ACTIVITY TOLERANCE: Activity tolerance: ***  FUNCTIONAL OUTCOME MEASURES: {OTFUNCTIONALMEASURES:27238}  UPPER EXTREMITY ROM:    {AROM/PROM:27142} ROM Right eval Left eval  Shoulder flexion    Shoulder abduction    Shoulder adduction    Shoulder extension    Shoulder internal rotation    Shoulder external rotation    Elbow flexion    Elbow extension    Wrist flexion    Wrist extension    Wrist ulnar deviation    Wrist radial deviation    Wrist pronation    Wrist supination    (Blank rows = not tested)  UPPER EXTREMITY MMT:     MMT Right eval Left eval  Shoulder flexion    Shoulder abduction    Shoulder adduction  Shoulder extension    Shoulder internal rotation    Shoulder external rotation    Middle trapezius    Lower trapezius    Elbow flexion    Elbow  extension    Wrist flexion    Wrist extension    Wrist ulnar deviation    Wrist radial deviation    Wrist pronation    Wrist supination    (Blank rows = not tested)  HAND FUNCTION: {handfunction:27230}  COORDINATION: {otcoordination:27237}  SENSATION: {sensation:27233}  EDEMA: ***  MUSCLE TONE: {UETONE:25567}  COGNITION: Overall cognitive status: {cognition:24006}  VISION: Subjective report: *** Baseline vision: {OTBASELINEVISION:25363} Visual history: {OTVISUALHISTORY:25364}  VISION ASSESSMENT: {visionassessment:27231}  Patient has difficulty with following activities due to following visual impairments: ***  PERCEPTION: {Perception:25564}  PRAXIS: {Praxis:25565}  OBSERVATIONS: ***   TODAY'S TREATMENT:                                                                                                                              DATE: ***   PATIENT EDUCATION: Education details: *** Person educated: {Person educated:25204} Education method: {Education Method:25205} Education comprehension: {Education Comprehension:25206}  HOME EXERCISE PROGRAM: ***   GOALS: Goals reviewed with patient? {yes/no:20286}  SHORT TERM GOALS: Target date: ***  *** Baseline: Goal status: {GOALSTATUS:25110}  2.  *** Baseline:  Goal status: {GOALSTATUS:25110}  3.  *** Baseline:  Goal status: {GOALSTATUS:25110}  4.  *** Baseline:  Goal status: {GOALSTATUS:25110}  5.  *** Baseline:  Goal status: {GOALSTATUS:25110}  6.  *** Baseline:  Goal status: {GOALSTATUS:25110}  LONG TERM GOALS: Target date: ***  *** Baseline:  Goal status: {GOALSTATUS:25110}  2.  *** Baseline:  Goal status: {GOALSTATUS:25110}  3.  *** Baseline:  Goal status: {GOALSTATUS:25110}  4.  *** Baseline:  Goal status: {GOALSTATUS:25110}  5.  *** Baseline:  Goal status: {GOALSTATUS:25110}  6.  *** Baseline:  Goal status: {GOALSTATUS:25110}  ASSESSMENT:  CLINICAL  IMPRESSION: Patient is a *** y.o. *** who was seen today for occupational therapy evaluation for ***.   PERFORMANCE DEFICITS: in functional skills including {OT physical skills:25468}, cognitive skills including {OT cognitive skills:25469}, and psychosocial skills including {OT psychosocial skills:25470}.   IMPAIRMENTS: are limiting patient from {OT performance deficits:25471}.   CO-MORBIDITIES: {Comorbidities:25485} that affects occupational performance. Patient will benefit from skilled OT to address above impairments and improve overall function.  MODIFICATION OR ASSISTANCE TO COMPLETE EVALUATION: {OT modification:25474}  OT OCCUPATIONAL PROFILE AND HISTORY: {OT PROFILE AND HISTORY:25484}  CLINICAL DECISION MAKING: {OT CDM:25475}  REHAB POTENTIAL: {rehabpotential:25112}  EVALUATION COMPLEXITY: {Evaluation complexity:25115}    PLAN:  OT FREQUENCY: {rehab frequency:25116}  OT DURATION: {rehab duration:25117}  PLANNED INTERVENTIONS: {OT Interventions:25467}  RECOMMENDED OTHER SERVICES: ***  CONSULTED AND AGREED WITH PLAN OF CARE: {ZOX:09604}  PLAN FOR NEXT SESSION: ***   Willa Frater, OTR/L 04/18/2023, 8:23 AM

## 2023-04-18 NOTE — Therapy (Signed)
OUTPATIENT OCCUPATIONAL THERAPY NEURO EVALUATION  Patient Name: Ethan Boyd MRN: [id number] DOB:Aug 31, 1956, 67 y.o., male Today's Date: 04/18/2023  PCP: Ethan Levins, MD REFERRING PROVIDER: Corwin Levins, MD  END OF SESSION:  OT End of Session - 04/18/23 1428     Visit Number 1    Number of Visits 16    Authorization Type Medicare A&B / BCBS    OT Start Time 1315    OT Stop Time 1403    OT Time Calculation (min) 48 min    Activity Tolerance Patient tolerated treatment well    Behavior During Therapy [facility] for tasks assessed/performed             Past Medical History:  Diagnosis Date   DIABETES MELLITUS, TYPE II 02/18/2009   Qualifier: Diagnosis of  By: Ethan Ruiz MD, Ethan Boyd    Diverticulosis 08/31/2012   Colonoscopy, June 10, 2009, Dr Perry/GI   ERECTILE DYSFUNCTION 02/18/2009   Qualifier: Diagnosis of  By: Ethan Ruiz MD, Ethan Boyd    Erectile dysfunction 09/07/2012   HYPERLIPIDEMIA 02/18/2009   Qualifier: Diagnosis of  By: Ethan Ruiz MD, Ethan Boyd    HYPERTENSION 02/18/2009   Qualifier: Diagnosis of  By: Ethan Ruiz MD, Ethan Boyd    Past Surgical History:  Procedure Laterality Date   COLONOSCOPY  06/10/2009   right knee cartilage  12/26/1974   Patient Active Problem List   Diagnosis Date Noted   Acute stroke due to ischemia 03/28/2023   Right foot drop 03/28/2023   CVA (cerebral vascular accident) 03/19/2023   Vitamin D deficiency 07/31/2022   COPD (chronic obstructive pulmonary disease) 07/29/2022   Pulmonary nodule, left 08/09/2021   Abnormal physical evaluation 07/15/2021   Screen for colon cancer 07/15/2021   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   Type II diabetes mellitus with manifestations 07/15/2021   Hyperlipidemia LDL goal <70 07/15/2021   Tachycardia 07/15/2021   PVC (premature ventricular contraction) 07/15/2021   Insulin dependent type 2 diabetes mellitus 07/15/2021   Lung nodule 07/15/2021   EKG abnormality 09/07/2012   Erectile dysfunction 09/07/2012   Snoring 09/07/2012    Encounter for well adult exam with abnormal findings 08/31/2012   Diverticulosis 08/31/2012   Essential hypertension 02/18/2009    ONSET DATE: 03/31/2023 (referral date); 03/20/2023 onset date  REFERRING DIAG: R29.898 (ICD-10-CM) - Right arm weakness  THERAPY DIAG:  Other lack of coordination  Muscle weakness (generalized)  Unsteadiness on feet  Stiffness of right shoulder, not elsewhere classified  Rationale for Evaluation and Treatment: Rehabilitation  SUBJECTIVE:   SUBJECTIVE STATEMENT:  Pt goes by "[name]" Pt accompanied by: significant other Pt's wife, Ethan Boyd, reports fine motor, gripping, cutting food have been difficult. She also reports patient is competitive and likes to know if he is doing well typically on a letter grade system. Pt likes to understand the why behind treatment.  PERTINENT HISTORY: HTN, COPD, insulin-dependent T2DM, HLD MRI brain was revealing for acute infarct of left pons during hospitalization in March. Repeat MRI brain 03/29/23 - IMPRESSION: Interval evolution of the now subacute infarct in the left paramedian pons. No new acute infarct or intracranial hemorrhage.  PRECAUTIONS: Fall  WEIGHT BEARING RESTRICTIONS: No  PAIN:  Are you having pain? No  FALLS: Has patient fallen in last 6 months? No  LIVING ENVIRONMENT: Lives with: lives with their spouse Lives in: House/apartment - Two level house with bedroom and bathroom downstairs Stairs: Yes: Internal: 7 steps to landing then 7 more steps; left hand rail for 7 steps, then handrail on  both sides for second set and External: 4 steps; on left going up Has following equipment at home: Single point cane, Walker - 2 wheeled, Wheelchair (transport), and Grab bars Bathroom setup: upstairs bathroom walk-in shower with small threshold with grab bars and built-in shower seat. Regular height commode  PLOF:  ADLs independent, IADLs independent for what patient was responsible for, driving, retired English as a second language teacher), patient has two adult children (Ethan Boyd and Ethan Boyd) , enjoys golfing, ride around in truck to visit friends  PATIENT GOALS: Pt wants to be "back to where I was in February" with no limitations/weakness.   OBJECTIVE:   HAND DOMINANCE: Right, however patient using left hand recently  ADLs:  Eating: Difficulty cutting food, mild difficulty holding cups pending the weight (drinks from [facility] with straw to prevent spilling), slower to not spill Grooming: Brushing teeth with left mostly, slow on the right, not using q-tips currently UB Dressing: Difficulty with buttons, but otherwise independent LB Dressing: Independent Toileting: Independent, mostly using left hand for wiping Bathing: independent Tub Shower transfers: Independent Equipment: Grab bars and Walk in shower  IADLs: Shopping: Wife now doing grocery shopping where as pt would before. Concern for fatigue, balance, and overhead reaching Light housekeeping: Wife performing, but pt would share responsibility prior (goal to fold clothes) Meal Prep: Wife did most of the cooking before. Does not want to address Community mobility: Utilizing SPC 70% of the time. Pt does not use in the house, but if he leaves the house at all. Medication management: Independent, now have pop up tops.  Financial management: Independent Handwriting: 75% legible, improved gripping of pen as well per his report.  MOBILITY STATUS: Independent with SPC  POSTURE COMMENTS:  No Significant postural limitations Sitting balance:  Independent  ACTIVITY TOLERANCE: Activity tolerance: Pt reports he would force himself to get through every aisle of the grocery store, but it would be difficult.   FUNCTIONAL OUTCOME MEASURES: FOTO: Completed 4/23 score 61 Quick Dash: Will address next visit Functional reach: Will address next visit  UPPER EXTREMITY ROM:    Active ROM Right eval Left eval  Shoulder flexion 100* WFL  Shoulder abduction 110* WFL  Shoulder  adduction    Shoulder extension    Shoulder internal rotation Hip ht [facility]  Shoulder external rotation [facility]  Elbow flexion [facility] WFL  Elbow extension [facility] WFL  Wrist flexion 40* WFL  Wrist extension [facility] WFL  Wrist ulnar deviation    Wrist radial deviation    Wrist pronation [facility] WFL  Wrist supination WFL WFL  (Blank rows = not tested)  Grasp slightly worse of right compared to left  UPPER EXTREMITY MMT:     MMT Right eval Left eval  Shoulder flexion 4-   Shoulder abduction    Shoulder adduction    Shoulder extension 4-   Shoulder internal rotation    Shoulder external rotation    Middle trapezius    Lower trapezius    Elbow flexion 4-   Elbow extension 4-   Wrist flexion    Wrist extension    Wrist ulnar deviation    Wrist radial deviation    Wrist pronation    Wrist supination    (Blank rows = not tested)  Grasp 3+ right hand  HAND FUNCTION: Grip strength: Right: ** lbs; Left: ** lbs and Lateral pinch: Right: ** lbs, Left: ** lbs -Test next visit  COORDINATION: 9 Hole Peg test: Right: 85 sec; Left: 29 sec Box and Blocks:  Right **  blocks, Left **blocks - Test next visit  SENSATION: WFL per patient report; however, may benefit from stereognosis  EDEMA: None  MUSCLE TONE: Appears to be WFL  COGNITION: Overall cognitive status: Within functional limits for tasks assessed  VISION: Subjective report: No vision changes noted Baseline vision: No visual deficits  VISION ASSESSMENT: Not tested  Patient has difficulty with following activities due to following visual impairments: None  PERCEPTION: WFL  PRAXIS: WFL  OBSERVATIONS: Pt able to walk from lobby area to table utilizing SPC. No loss of balance. Pt frequently utilizing left hand over right, although notes being right handed. Pt well kept and has supportive spouse.   TODAY'S TREATMENT:                                                                                                                               DATE: N/A   PATIENT EDUCATION: Education details: OT POC and pt goals review Person educated: Patient and Spouse Education method: Explanation Education comprehension: verbalized understanding  HOME EXERCISE PROGRAM: N/A   GOALS: Goals reviewed with patient? Yes  SHORT TERM GOALS: Target date: 05/16/2023   Pt will demonstrate improved ease with fastening buttons as evidenced by decreasing 3 button/unbutton time by TBD seconds. Baseline: Wife assists with polo shirts, need to address time it takes to complete Goal status: INITIAL  2.  Pt will demonstrate improved fine motor coordination for ADLs as evidenced by decreasing 9 hole peg test score for Rt hand by 10 secs. Baseline: Rt - 85 sec Goal status: INITIAL  3.  Pt will improve Rt grip strength by TBD next visit as needed for daily activity. Baseline: Will test next visit Goal status: INITIAL  4.  Pt will improve Rt shoulder flexion AROM to 120* required for overhead reaching tasks. Baseline: Rt shoulder flexion 100* Goal status: INITIAL  5.  Pt will improve handwriting legibility to 100% in order to write checks. Baseline: 75% legible Goal status: INITIAL  6.  Pt will complete functional activity task in standing for up to 10 minutes without therapeutic rest break to simulate shopping tasks. Baseline: Unable currently for grocery shopping Goal status: INITIAL  LONG TERM GOALS: Target date: 06/13/2023  Pt will complete FOTO assessment at time of discharge scoring 73 or greater indicating functional progression with ADL and IADL completion. Baseline: 61 Goal status: INITIAL  2.  Pt will demonstrate improved fine motor coordination for ADLs as evidenced by decreasing 9 hole peg test score for Rt hand by 25 secs Baseline: Rt - 85 sec Goal status: INITIAL  3.  Pt will be able to place at least TBD blocks using Rt hand with completion of Box and Blocks test. Baseline: Will test next visit Goal status:  INITIAL  4.  Pt will improve Rt shoulder flexion AROM to 140* required for overhead reaching tasks. Baseline: Rt shoulder flexion 100* Goal status: INITIAL  5.  Pt will be independent with HEP.  Baseline:  Goal status: INITIAL  6.  Pt will fold and put away basket of clothes independently. Baseline: Unable currently Goal status: INITIAL  ASSESSMENT:  CLINICAL IMPRESSION: Patient is a 67 y.o. right-handed male who was seen today for occupational therapy evaluation for due to acute CVA in March with evolution of stroke 4/3 resulting in worsening RUE weakness. Pt arrives to OT eval today with spouse. Pt reports he will force himself to do various tasks because he wants to get better, although some daily activities continue to be challenging. Pt notes most difficulty with fine motor skills. During ROM testing, observed decreased active ROM right shoulder. Please refer above for findings. Pt notes frequently using his left hand now. OT encouraging pt to return to use of right hand, even if it slows him down as research supports high intensity, repetitive exercise will typically provide most improvement of return to function. Pt receptive. Pt would benefit from skilled OT services to address performance deficits as listed below, and to maximize independence to return to ADL/IADLs, leisure, and work per patient's goals.  PERFORMANCE DEFICITS: in functional skills including ADLs, IADLs, coordination, dexterity, sensation, ROM, strength, Fine motor control, Gross motor control, mobility, balance, body mechanics, endurance, and UE functional use, and psychosocial skills including interpersonal interactions and routines and behaviors.   IMPAIRMENTS: are limiting patient from ADLs, IADLs, leisure, social participation, and driving .   CO-MORBIDITIES: may have co-morbidities  that affects occupational performance. Patient will benefit from skilled OT to address above impairments and improve overall  function.  MODIFICATION OR ASSISTANCE TO COMPLETE EVALUATION: No modification of tasks or assist necessary to complete an evaluation.  OT OCCUPATIONAL PROFILE AND HISTORY: Detailed assessment: Review of records and additional review of physical, cognitive, psychosocial history related to current functional performance.  CLINICAL DECISION MAKING: Moderate - several treatment options, min-mod task modification necessary  REHAB POTENTIAL: Good  EVALUATION COMPLEXITY: Low    PLAN:  OT FREQUENCY: 2x/week  OT DURATION: 8 weeks  PLANNED INTERVENTIONS: self care/ADL training, therapeutic exercise, therapeutic activity, neuromuscular re-education, passive range of motion, gait training, balance training, functional mobility training, electrical stimulation, ultrasound, moist heat, cryotherapy, patient/family education, cognitive remediation/compensation, visual/perceptual remediation/compensation, psychosocial skills training, energy conservation, coping strategies training, DME and/or AE instructions, and Re-evaluation  RECOMMENDED OTHER SERVICES: None  CONSULTED AND AGREED WITH PLAN OF CARE: Patient and family member/caregiver  PLAN FOR NEXT SESSION: Complete functional reach, box and blocks, stereognosis, Quick DASH, grip and lateral pinch testing; RUE HEP; coordination exercises   Alize Borrayo M Charbel Los, OT 04/18/2023, 2:30 PM

## 2023-04-18 NOTE — Therapy (Signed)
OUTPATIENT PHYSICAL THERAPY NEURO TREATMENT   Patient Name: Jerret Mcbane MRN: [id number] DOB:24-Feb-1956, 67 y.o., male Today's Date: 04/18/2023   PCP: Oliver Barre, MD REFERRING PROVIDER: Oliver Barre, MD  END OF SESSION:  PT End of Session - 04/18/23 1357     Visit Number 6    Number of Visits 17    Date for PT Re-Evaluation 06/09/23    Authorization Type BCBS    PT Start Time 1405   received from OT   PT Stop Time 1446    PT Time Calculation (min) 41 min    Equipment Utilized During Treatment Gait belt    Activity Tolerance Patient tolerated treatment well    Behavior During Therapy [facility] for tasks assessed/performed              Past Medical History:  Diagnosis Date   DIABETES MELLITUS, TYPE II 02/18/2009   Qualifier: Diagnosis of  By: Jonny Ruiz MD, Len Blalock    Diverticulosis 08/31/2012   Colonoscopy, June 10, 2009, Dr Perry/GI   ERECTILE DYSFUNCTION 02/18/2009   Qualifier: Diagnosis of  By: Jonny Ruiz MD, Len Blalock    Erectile dysfunction 09/07/2012   HYPERLIPIDEMIA 02/18/2009   Qualifier: Diagnosis of  By: Jonny Ruiz MD, Len Blalock    HYPERTENSION 02/18/2009   Qualifier: Diagnosis of  By: Jonny Ruiz MD, Len Blalock    Past Surgical History:  Procedure Laterality Date   COLONOSCOPY  06/10/2009   right knee cartilage  12/26/1974   Patient Active Problem List   Diagnosis Date Noted   Acute stroke due to ischemia 03/28/2023   Right foot drop 03/28/2023   CVA (cerebral vascular accident) 03/19/2023   Vitamin D deficiency 07/31/2022   COPD (chronic obstructive pulmonary disease) 07/29/2022   Pulmonary nodule, left 08/09/2021   Abnormal physical evaluation 07/15/2021   Screen for colon cancer 07/15/2021   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   Type II diabetes mellitus with manifestations 07/15/2021   Hyperlipidemia LDL goal <70 07/15/2021   Tachycardia 07/15/2021   PVC (premature ventricular contraction) 07/15/2021   Insulin dependent type 2 diabetes mellitus 07/15/2021   Lung nodule 07/15/2021    EKG abnormality 09/07/2012   Erectile dysfunction 09/07/2012   Snoring 09/07/2012   Encounter for well adult exam with abnormal findings 08/31/2012   Diverticulosis 08/31/2012   Essential hypertension 02/18/2009    ONSET DATE: 03/19/23  REFERRING DIAG:  I63.9 (ICD-10-CM) - Acute stroke due to ischemia  M21.371 (ICD-10-CM) - Right foot drop    THERAPY DIAG:  Difficulty in walking, not elsewhere classified  Muscle weakness (generalized)  Other abnormalities of gait and mobility  Other lack of coordination  Rationale for Evaluation and Treatment: Rehabilitation  SUBJECTIVE:  SUBJECTIVE STATEMENT: Patient reports doing well. Feels as though walking is going well. Denies falls/near falls.   Pt accompanied by: family member  PERTINENT HISTORY: HTN, COPD, insulin-dependent T2DM, HLD   PAIN:  Are you having pain? No  PRECAUTIONS: Fall  PATIENT GOALS: "walk normal"   OBJECTIVE:   DIAGNOSTIC FINDINGS: 03/29/23 brain MRI:  IMPRESSION: Interval evolution of the now subacute infarct in the left paramedian pons. No new acute infarct or intracranial hemorrhage.  TODAY'S TREATMENT:                                                                                                                              GAIT: -115' "just walking" with SPC + close supervision/light CGA  -self selected increased pace with increased compensatory strategies noted and increased instance of R toe drag  -115' with SPC + CGA  -increased L knee flexion in stance to allow for R heel contact   NMR:  -tall kneel-> half kneel R LE transition with focus on eventual heel contact with U UE/B UE support   -increased challenge with posterior transition -added red theraband for increased resistance into half kneeling     PATIENT EDUCATION: Education details: continue HEP, extensive conversation re: extensor tone, functional implications, ways to mediate Person educated: Patient Education method: Explanation, Demonstration, and Handouts Education comprehension: verbalized understanding and needs further education  HOME EXERCISE PROGRAM: Access Code: [email or web address] URL: [email or web address] Date: 04/03/2023 Prepared by: Peter Congo  Exercises - Alternating Step Taps with Counter Support  - 1 x daily - 7 x weekly - 3 sets - 10 reps - Staggered Sit-to-Stand  - 1 x daily - 7 x weekly - 3 sets - 10 reps -Left arm supported on hand rail at stairs: lift your R foot onto the 1st step tapping your heel onto the step   -think "up and on" then "up and back" -lifting R leg up and over dumbbell or can of soup from a seated position   -emphasis on up and over and not around - Modified [employer]  - 1 x daily - 7 x weekly - 3 sets - 30-45s hold -standing lifting R toes off the floor  - Standing Knee Flexion AROM with Chair Support  - 1 x daily - 7 x weekly - 3 sets - 10 reps   GOALS: Goals reviewed with patient? Yes  SHORT TERM GOALS: Target date: 04/28/23  Pt will be independent with initial HEP for improved gait mechanics  Baseline: to be provided Goal status: INITIAL  2.  Pt will improve gait speed to >/= .20m/s to demonstrate improved community ambulation  Baseline: .25m/s Goal status: INITIAL  3.  Pt will improve 5x STS to </= 13 sec to demo improved functional LE strength and balance   Baseline: 15.22s B UE Goal status: INITIAL  4.  Pt will improve Berg score to 42/56 for decreased fall risk   Baseline: 38/56 (4/8) Goal status: INITIAL  LONG TERM GOALS: Target date: 06/09/23  Pt will be independent with final HEP for improved gait mechanics  Baseline: to be provided  Goal status: INITIAL  2.  Pt will improve gait speed to >/= .29m/s to demonstrate improved community  ambulation  Baseline: .18m/s Goal status: INITIAL  3.  Pt will improve 5x STS to </= 10 sec to demo improved functional LE strength and balance   Baseline: 15.22s B UE Goal status: INITIAL  4.  Pt will improve Berg score to 45/56 for decreased fall risk  Baseline: 38/56 (4/8) Goal status: INITIAL  5.  Patient will improve FOTO score to >/= 69 to demonstrate functional improvement Baseline: 52 Goal status: INITIAL  ASSESSMENT:  CLINICAL IMPRESSION: Patient seen for skilled PT session with emphasis on R NMR and gait retraining. Patient with strengthening compensatory strategies noted including, R hip hike, R circumduction, L knee flexion in stance to allow for R heel contact. These deviations increase, as well as the patients risk for falling, with increased speed (patients self- selected pace included). Significant portion of session spent explaining hypertonicity, functional implications, presentation, ways to reduce/compensate, benefit of bracing. Continue POC.     OBJECTIVE IMPAIRMENTS: Abnormal gait, decreased balance, decreased coordination, decreased endurance, decreased knowledge of condition, decreased knowledge of use of DME, decreased strength, impaired tone, and impaired UE functional use.   ACTIVITY LIMITATIONS: carrying, lifting, standing, squatting, stairs, transfers, locomotion level, and caring for others  PARTICIPATION LIMITATIONS: meal prep, cleaning, interpersonal relationship, driving, shopping, community activity, occupation, and yard work  PERSONAL FACTORS: Age, Fitness, Past/current experiences, Time since onset of injury/illness/exacerbation, Transportation, and 3+ comorbidities: see above  are also affecting patient's functional outcome.   REHAB POTENTIAL: Good  CLINICAL DECISION MAKING: Stable/uncomplicated  EVALUATION COMPLEXITY: Low  PLAN:  PT FREQUENCY: 2x/week  PT DURATION: 8 weeks  PLANNED INTERVENTIONS: Therapeutic exercises, Therapeutic  activity, Neuromuscular re-education, Balance training, Gait training, Patient/Family education, Self Care, Joint mobilization, Stair training, Vestibular training, Canalith repositioning, Visual/preceptual remediation/compensation, Orthotic/Fit training, DME instructions, Aquatic Therapy, Dry Needling, Electrical stimulation, Taping, Manual therapy, and Re-evaluation  PLAN FOR NEXT SESSION: add to HEP, gait with SPC, minimizing compensatory strategies, use of AFO? Quadruped, anything moving/strengthening outside of R LE extensor tone, (functional) leg length discrepancy?    Westley Foots, PT, DPT, CBIS 04/18/2023, 3:49 PM

## 2023-04-21 ENCOUNTER — Ambulatory Visit: Payer: Medicare Other

## 2023-04-21 DIAGNOSIS — R2681 Unsteadiness on feet: Secondary | ICD-10-CM

## 2023-04-21 DIAGNOSIS — M6281 Muscle weakness (generalized): Secondary | ICD-10-CM

## 2023-04-21 DIAGNOSIS — R262 Difficulty in walking, not elsewhere classified: Secondary | ICD-10-CM | POA: Diagnosis not present

## 2023-04-21 DIAGNOSIS — R2689 Other abnormalities of gait and mobility: Secondary | ICD-10-CM

## 2023-04-21 DIAGNOSIS — R278 Other lack of coordination: Secondary | ICD-10-CM

## 2023-04-21 NOTE — Therapy (Signed)
OUTPATIENT PHYSICAL THERAPY NEURO TREATMENT   Patient Name: Ethan Boyd MRN: [id number] DOB:1956-11-10, 67 y.o., male Today's Date: 04/21/2023   PCP: Oliver Barre, MD REFERRING PROVIDER: Oliver Barre, MD  END OF SESSION:  PT End of Session - 04/21/23 1353     Visit Number 7    Number of Visits 17    Date for PT Re-Evaluation 06/09/23    Authorization Type [employer]    PT Start Time 1400    PT Stop Time 1447    PT Time Calculation (min) 47 min    Equipment Utilized During Treatment Gait belt    Activity Tolerance Patient tolerated treatment well    Behavior During Therapy WFL for tasks assessed/performed              Past Medical History:  Diagnosis Date   DIABETES MELLITUS, TYPE II 02/18/2009   Qualifier: Diagnosis of  By: Jonny Ruiz MD, Len Blalock    Diverticulosis 08/31/2012   Colonoscopy, June 10, 2009, Dr Perry/GI   ERECTILE DYSFUNCTION 02/18/2009   Qualifier: Diagnosis of  By: Jonny Ruiz MD, Len Blalock    Erectile dysfunction 09/07/2012   HYPERLIPIDEMIA 02/18/2009   Qualifier: Diagnosis of  By: Jonny Ruiz MD, Len Blalock    HYPERTENSION 02/18/2009   Qualifier: Diagnosis of  By: Jonny Ruiz MD, Len Blalock    Past Surgical History:  Procedure Laterality Date   COLONOSCOPY  06/10/2009   right knee cartilage  12/26/1974   Patient Active Problem List   Diagnosis Date Noted   Acute stroke due to ischemia (HCC) 03/28/2023   Right foot drop 03/28/2023   CVA (cerebral vascular accident) (HCC) 03/19/2023   Vitamin D deficiency 07/31/2022   COPD (chronic obstructive pulmonary disease) (HCC) 07/29/2022   Pulmonary nodule, left 08/09/2021   Abnormal physical evaluation 07/15/2021   Screen for colon cancer 07/15/2021   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   Type II diabetes mellitus with manifestations (HCC) 07/15/2021   Hyperlipidemia LDL goal <70 07/15/2021   Tachycardia 07/15/2021   PVC (premature ventricular contraction) 07/15/2021   Insulin dependent type 2 diabetes mellitus (HCC) 07/15/2021   Lung  nodule 07/15/2021   EKG abnormality 09/07/2012   Erectile dysfunction 09/07/2012   Snoring 09/07/2012   Encounter for well adult exam with abnormal findings 08/31/2012   Diverticulosis 08/31/2012   Essential hypertension 02/18/2009    ONSET DATE: 03/19/23  REFERRING DIAG:  I63.9 (ICD-10-CM) - Acute stroke due to ischemia  M21.371 (ICD-10-CM) - Right foot drop    THERAPY DIAG:  Other lack of coordination  Muscle weakness (generalized)  Unsteadiness on feet  Difficulty in walking, not elsewhere classified  Other abnormalities of gait and mobility  Rationale for Evaluation and Treatment: Rehabilitation  SUBJECTIVE:  SUBJECTIVE STATEMENT: Patient reports doing well. Reports he's able to walk further, went food shopping by himself. Per patient, MD cleared him to drive "as long as he's comfortable." Denies falls/near falls.   Pt accompanied by: family member  PERTINENT HISTORY: HTN, COPD, insulin-dependent T2DM, HLD   PAIN:  Are you having pain? No  PRECAUTIONS: Fall  PATIENT GOALS: "walk normal"   OBJECTIVE:   DIAGNOSTIC FINDINGS: 03/29/23 brain MRI:  IMPRESSION: Interval evolution of the now subacute infarct in the left paramedian pons. No new acute infarct or intracranial hemorrhage.  TODAY'S TREATMENT:                                                                                                                              GAIT: -51' x2 with cup on distal dorsum of foot for feedback on adequate dorsiflexion/heel contact  -extensive education on kinematically correct gait cycle and then deviations that patient currently presents with  -pre gait: Anterior/posterior step R LE with mirror for visual feedback on hip alignment + tape on B ASIS for further visual feedback -multiple  laps of 8ft gait with mirror for visual feedback on hip alignment to minimize hip hiking   -patient with better awareness of this with hands on hips   PATIENT EDUCATION: Education details: continue HEP, gait cycle Person educated: Patient Education method: Explanation, Demonstration, and Handouts Education comprehension: verbalized understanding and needs further education  HOME EXERCISE PROGRAM: Access Code: [email or web address] URL: [email or web address] Date: 04/03/2023 Prepared by: Peter Congo  Exercises - Alternating Step Taps with Counter Support  - 1 x daily - 7 x weekly - 3 sets - 10 reps - Staggered Sit-to-Stand  - 1 x daily - 7 x weekly - 3 sets - 10 reps -Left arm supported on hand rail at stairs: lift your R foot onto the 1st step tapping your heel onto the step   -think "up and on" then "up and back" -lifting R leg up and over dumbbell or can of soup from a seated position   -emphasis on up and over and not around - Modified [employer]  - 1 x daily - 7 x weekly - 3 sets - 30-45s hold -standing lifting R toes off the floor  - Standing Knee Flexion AROM with Chair Support  - 1 x daily - 7 x weekly - 3 sets - 10 reps   GOALS: Goals reviewed with patient? Yes  SHORT TERM GOALS: Target date: 04/28/23  Pt will be independent with initial HEP for improved gait mechanics  Baseline: to be provided Goal status: INITIAL  2.  Pt will improve gait speed to >/= .8m/s to demonstrate improved community ambulation  Baseline: .31m/s Goal status: INITIAL  3.  Pt will improve 5x STS to </= 13 sec to demo improved functional LE strength and balance   Baseline: 15.22s B UE Goal status: INITIAL  4.  Pt will improve Berg score to 42/56 for decreased fall risk  Baseline: 38/56 (4/8) Goal status: INITIAL   LONG TERM GOALS: Target date: 06/09/23  Pt will be independent with final HEP for improved gait mechanics  Baseline: to be provided  Goal status: INITIAL  2.   Pt will improve gait speed to >/= .63m/s to demonstrate improved community ambulation  Baseline: .20m/s Goal status: INITIAL  3.  Pt will improve 5x STS to </= 10 sec to demo improved functional LE strength and balance   Baseline: 15.22s B UE Goal status: INITIAL  4.  Pt will improve Berg score to 45/56 for decreased fall risk  Baseline: 38/56 (4/8) Goal status: INITIAL  5.  Patient will improve FOTO score to >/= 69 to demonstrate functional improvement Baseline: 52 Goal status: INITIAL  ASSESSMENT:  CLINICAL IMPRESSION: Patient seen for skilled PT session with emphasis on gait retraining. He continues to present with R hip hiking to compensate for decreased R hip flexion/knee flexion through swing phase, thus resulting in a relative leg length discrepancy necessitating premature L knee flexion in terminal stance. Patient is improving with visual feedback. Continue POC.   OBJECTIVE IMPAIRMENTS: Abnormal gait, decreased balance, decreased coordination, decreased endurance, decreased knowledge of condition, decreased knowledge of use of DME, decreased strength, impaired tone, and impaired UE functional use.   ACTIVITY LIMITATIONS: carrying, lifting, standing, squatting, stairs, transfers, locomotion level, and caring for others  PARTICIPATION LIMITATIONS: meal prep, cleaning, interpersonal relationship, driving, shopping, community activity, occupation, and yard work  PERSONAL FACTORS: Age, Fitness, Past/current experiences, Time since onset of injury/illness/exacerbation, Transportation, and 3+ comorbidities: see above  are also affecting patient's functional outcome.   REHAB POTENTIAL: Good  CLINICAL DECISION MAKING: Stable/uncomplicated  EVALUATION COMPLEXITY: Low  PLAN:  PT FREQUENCY: 2x/week  PT DURATION: 8 weeks  PLANNED INTERVENTIONS: Therapeutic exercises, Therapeutic activity, Neuromuscular re-education, Balance training, Gait training, Patient/Family education, Self  Care, Joint mobilization, Stair training, Vestibular training, Canalith repositioning, Visual/preceptual remediation/compensation, Orthotic/Fit training, DME instructions, Aquatic Therapy, Dry Needling, Electrical stimulation, Taping, Manual therapy, and Re-evaluation  PLAN FOR NEXT SESSION: add to HEP, gait with SPC, minimizing compensatory strategies, use of AFO? Quadruped, anything moving/strengthening outside of R LE extensor tone, (functional) leg length discrepancy?    Westley Foots, PT, DPT, CBIS 04/21/2023, 3:35 PM

## 2023-04-25 ENCOUNTER — Ambulatory Visit: Payer: Medicare Other

## 2023-04-25 DIAGNOSIS — R278 Other lack of coordination: Secondary | ICD-10-CM

## 2023-04-25 DIAGNOSIS — R2689 Other abnormalities of gait and mobility: Secondary | ICD-10-CM

## 2023-04-25 DIAGNOSIS — M6281 Muscle weakness (generalized): Secondary | ICD-10-CM

## 2023-04-25 DIAGNOSIS — R2681 Unsteadiness on feet: Secondary | ICD-10-CM

## 2023-04-25 DIAGNOSIS — R262 Difficulty in walking, not elsewhere classified: Secondary | ICD-10-CM

## 2023-04-25 NOTE — Therapy (Signed)
OUTPATIENT PHYSICAL THERAPY NEURO TREATMENT   Patient Name: Ethan Boyd MRN: [id number] DOB:03-Nov-1956, 67 y.o., male Today's Date: 04/25/2023   PCP: Oliver Barre, MD REFERRING PROVIDER: Oliver Barre, MD  END OF SESSION:  PT End of Session - 04/25/23 1352     Visit Number 8    Number of Visits 17    Date for PT Re-Evaluation 06/09/23    Authorization Type [employer]    PT Start Time 1406   PT helping another patient   PT Stop Time 1446    PT Time Calculation (min) 40 min    Equipment Utilized During Treatment Gait belt    Activity Tolerance Patient tolerated treatment well    Behavior During Therapy WFL for tasks assessed/performed              Past Medical History:  Diagnosis Date   DIABETES MELLITUS, TYPE II 02/18/2009   Qualifier: Diagnosis of  By: Jonny Ruiz MD, Len Blalock    Diverticulosis 08/31/2012   Colonoscopy, June 10, 2009, Dr Perry/GI   ERECTILE DYSFUNCTION 02/18/2009   Qualifier: Diagnosis of  By: Jonny Ruiz MD, Len Blalock    Erectile dysfunction 09/07/2012   HYPERLIPIDEMIA 02/18/2009   Qualifier: Diagnosis of  By: Jonny Ruiz MD, Len Blalock    HYPERTENSION 02/18/2009   Qualifier: Diagnosis of  By: Jonny Ruiz MD, Len Blalock    Past Surgical History:  Procedure Laterality Date   COLONOSCOPY  06/10/2009   right knee cartilage  12/26/1974   Patient Active Problem List   Diagnosis Date Noted   Acute stroke due to ischemia (HCC) 03/28/2023   Right foot drop 03/28/2023   CVA (cerebral vascular accident) (HCC) 03/19/2023   Vitamin D deficiency 07/31/2022   COPD (chronic obstructive pulmonary disease) (HCC) 07/29/2022   Pulmonary nodule, left 08/09/2021   Abnormal physical evaluation 07/15/2021   Screen for colon cancer 07/15/2021   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   Type II diabetes mellitus with manifestations (HCC) 07/15/2021   Hyperlipidemia LDL goal <70 07/15/2021   Tachycardia 07/15/2021   PVC (premature ventricular contraction) 07/15/2021   Insulin dependent type 2 diabetes  mellitus (HCC) 07/15/2021   Lung nodule 07/15/2021   EKG abnormality 09/07/2012   Erectile dysfunction 09/07/2012   Snoring 09/07/2012   Encounter for well adult exam with abnormal findings 08/31/2012   Diverticulosis 08/31/2012   Essential hypertension 02/18/2009    ONSET DATE: 03/19/23  REFERRING DIAG:  I63.9 (ICD-10-CM) - Acute stroke due to ischemia  M21.371 (ICD-10-CM) - Right foot drop    THERAPY DIAG:  Other lack of coordination  Muscle weakness (generalized)  Unsteadiness on feet  Difficulty in walking, not elsewhere classified  Other abnormalities of gait and mobility  Rationale for Evaluation and Treatment: Rehabilitation  SUBJECTIVE:  SUBJECTIVE STATEMENT: Patient reports doing well. Ambulating with cane still. Denies falls/near falls. Feels as though he needs to do more squats to build up more leg strength. Denies balance difficulties.   Pt accompanied by: family member  PERTINENT HISTORY: HTN, COPD, insulin-dependent T2DM, HLD   PAIN:  Are you having pain? No  PRECAUTIONS: Fall  PATIENT GOALS: "walk normal"   OBJECTIVE:   DIAGNOSTIC FINDINGS: 03/29/23 brain MRI:  IMPRESSION: Interval evolution of the now subacute infarct in the left paramedian pons. No new acute infarct or intracranial hemorrhage.  TODAY'S TREATMENT:                                                                                                                              GAIT: -115' no AD  -increased L LE vaulting, decreased R heel contact/ increased flat foot contact, prolonged R swing phase with shortened R stance phase -115' SPC   -decreased L LE vaulting, increased instances of R heel contact, prolonged R swing phase with shortened R stance phase   NMR:  -double leg stance on [employer]  pods reaching outside BOS   -progressed to modified SLS-> attempted R LE SLS   -noted no R ankle strategy with inappropriate L stepping strategy to attempt to regain LOB  -Anterior lunge onto Bosu ball -> added 5# ankle weight for improved proprioceptive input with noted slight evidence of R ankle strategy on compliant surface  PATIENT EDUCATION: Education details: continue HEP, gait cycle Person educated: Patient Education method: Explanation, Demonstration, and Handouts Education comprehension: verbalized understanding and needs further education  HOME EXERCISE PROGRAM: Access Code: [email or web address] URL: [email or web address] Date: 04/03/2023 Prepared by: Peter Congo  Exercises - Alternating Step Taps with Counter Support  - 1 x daily - 7 x weekly - 3 sets - 10 reps - Staggered Sit-to-Stand  - 1 x daily - 7 x weekly - 3 sets - 10 reps -Left arm supported on hand rail at stairs: lift your R foot onto the 1st step tapping your heel onto the step   -think "up and on" then "up and back" -lifting R leg up and over dumbbell or can of soup from a seated position   -emphasis on up and over and not around - Modified [employer]  - 1 x daily - 7 x weekly - 3 sets - 30-45s hold -standing lifting R toes off the floor  - Standing Knee Flexion AROM with Chair Support  - 1 x daily - 7 x weekly - 3 sets - 10 reps   GOALS: Goals reviewed with patient? Yes  SHORT TERM GOALS: Target date: 04/28/23  Pt will be independent with initial HEP for improved gait mechanics  Baseline: to be provided Goal status: INITIAL  2.  Pt will improve gait speed to >/= .62m/s to demonstrate improved community ambulation  Baseline: .43m/s Goal status: INITIAL  3.  Pt will improve 5x STS to </= 13 sec to demo  improved functional LE strength and balance   Baseline: 15.22s B UE Goal status: INITIAL  4.  Pt will improve Berg score to 42/56 for decreased fall risk   Baseline: 38/56 (4/8) Goal  status: INITIAL   LONG TERM GOALS: Target date: 06/09/23  Pt will be independent with final HEP for improved gait mechanics  Baseline: to be provided  Goal status: INITIAL  2.  Pt will improve gait speed to >/= .28m/s to demonstrate improved community ambulation  Baseline: .27m/s Goal status: INITIAL  3.  Pt will improve 5x STS to </= 10 sec to demo improved functional LE strength and balance   Baseline: 15.22s B UE Goal status: INITIAL  4.  Pt will improve Berg score to 45/56 for decreased fall risk  Baseline: 38/56 (4/8) Goal status: INITIAL  5.  Patient will improve FOTO score to >/= 69 to demonstrate functional improvement Baseline: 52 Goal status: INITIAL  ASSESSMENT:  CLINICAL IMPRESSION: Patient seen for skilled PT session with emphasis on R LE NMR. He demonstrates little to no R ankle strategy in attempts to maintain/regain balance, especially on compliant surfaces. With impaired proprioceptive input, this increases his risk for falling. He also remains with impaired gait kinematics, though these do continue to improve. Continue POC.   OBJECTIVE IMPAIRMENTS: Abnormal gait, decreased balance, decreased coordination, decreased endurance, decreased knowledge of condition, decreased knowledge of use of DME, decreased strength, impaired tone, and impaired UE functional use.   ACTIVITY LIMITATIONS: carrying, lifting, standing, squatting, stairs, transfers, locomotion level, and caring for others  PARTICIPATION LIMITATIONS: meal prep, cleaning, interpersonal relationship, driving, shopping, community activity, occupation, and yard work  PERSONAL FACTORS: Age, Fitness, Past/current experiences, Time since onset of injury/illness/exacerbation, Transportation, and 3+ comorbidities: see above  are also affecting patient's functional outcome.   REHAB POTENTIAL: Good  CLINICAL DECISION MAKING: Stable/uncomplicated  EVALUATION COMPLEXITY: Low  PLAN:  PT FREQUENCY:  2x/week  PT DURATION: 8 weeks  PLANNED INTERVENTIONS: Therapeutic exercises, Therapeutic activity, Neuromuscular re-education, Balance training, Gait training, Patient/Family education, Self Care, Joint mobilization, Stair training, Vestibular training, Canalith repositioning, Visual/preceptual remediation/compensation, Orthotic/Fit training, DME instructions, Aquatic Therapy, Dry Needling, Electrical stimulation, Taping, Manual therapy, and Re-evaluation  PLAN FOR NEXT SESSION: add to HEP, gait with SPC, minimizing compensatory strategies, use of AFO? Quadruped, anything moving/strengthening outside of R LE extensor tone, resisted gait?, ankle strategy, stepping strategy   Westley Foots, PT, DPT, CBIS 04/25/2023, 3:36 PM

## 2023-04-28 ENCOUNTER — Ambulatory Visit: Payer: Medicare Other

## 2023-04-28 ENCOUNTER — Ambulatory Visit: Payer: Medicare Other | Attending: Internal Medicine | Admitting: Physical Therapy

## 2023-04-28 DIAGNOSIS — R278 Other lack of coordination: Secondary | ICD-10-CM | POA: Diagnosis present

## 2023-04-28 DIAGNOSIS — R2689 Other abnormalities of gait and mobility: Secondary | ICD-10-CM | POA: Diagnosis present

## 2023-04-28 DIAGNOSIS — R262 Difficulty in walking, not elsewhere classified: Secondary | ICD-10-CM | POA: Insufficient documentation

## 2023-04-28 DIAGNOSIS — M6281 Muscle weakness (generalized): Secondary | ICD-10-CM

## 2023-04-28 DIAGNOSIS — R29898 Other symptoms and signs involving the musculoskeletal system: Secondary | ICD-10-CM | POA: Insufficient documentation

## 2023-04-28 DIAGNOSIS — R2681 Unsteadiness on feet: Secondary | ICD-10-CM | POA: Diagnosis present

## 2023-04-28 DIAGNOSIS — M25611 Stiffness of right shoulder, not elsewhere classified: Secondary | ICD-10-CM | POA: Insufficient documentation

## 2023-04-28 NOTE — Therapy (Signed)
OUTPATIENT OCCUPATIONAL THERAPY NEURO TREATMENT  Patient Name: Ethan Boyd MRN: [id number] DOB:[birth year], 67 y.o., male Today's Date: 04/28/2023  PCP: Corwin Levins, MD REFERRING PROVIDER: Corwin Levins, MD  END OF SESSION:  OT End of Session - 04/28/23 1314     Visit Number 2    Number of Visits 16    Authorization Type Medicare A&B / BCBS    OT Start Time 1315    OT Stop Time 1400    OT Time Calculation (min) 45 min    Equipment Utilized During Treatment red theraputty    Activity Tolerance Patient tolerated treatment well    Behavior During Therapy [facility] for tasks assessed/performed             Past Medical History:  Diagnosis Date   DIABETES MELLITUS, TYPE II 02/18/2009   Qualifier: Diagnosis of  By: Jonny Ruiz MD, Len Blalock    Diverticulosis 08/31/2012   Colonoscopy, June 10, 2009, Dr Perry/GI   ERECTILE DYSFUNCTION 02/18/2009   Qualifier: Diagnosis of  By: Jonny Ruiz MD, Len Blalock    Erectile dysfunction 09/07/2012   HYPERLIPIDEMIA 02/18/2009   Qualifier: Diagnosis of  By: Jonny Ruiz MD, Len Blalock    HYPERTENSION 02/18/2009   Qualifier: Diagnosis of  By: Jonny Ruiz MD, Len Blalock    Past Surgical History:  Procedure Laterality Date   COLONOSCOPY  06/10/2009   right knee cartilage  12/26/1974   Patient Active Problem List   Diagnosis Date Noted   Acute stroke due to ischemia (HCC) 03/28/2023   Right foot drop 03/28/2023   CVA (cerebral vascular accident) (HCC) 03/19/2023   Vitamin D deficiency 07/31/2022   COPD (chronic obstructive pulmonary disease) (HCC) 07/29/2022   Pulmonary nodule, left 08/09/2021   Abnormal physical evaluation 07/15/2021   Screen for colon cancer 07/15/2021   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   Type II diabetes mellitus with manifestations (HCC) 07/15/2021   Hyperlipidemia LDL goal <70 07/15/2021   Tachycardia 07/15/2021   PVC (premature ventricular contraction) 07/15/2021   Insulin dependent type 2 diabetes mellitus (HCC) 07/15/2021   Lung nodule 07/15/2021    EKG abnormality 09/07/2012   Erectile dysfunction 09/07/2012   Snoring 09/07/2012   Encounter for well adult exam with abnormal findings 08/31/2012   Diverticulosis 08/31/2012   Essential hypertension 02/18/2009    ONSET DATE: 03/31/2023 (referral date); 03/20/2023 onset date  REFERRING DIAG: R29.898 (ICD-10-CM) - Right arm weakness  THERAPY DIAG:  Other lack of coordination  Muscle weakness (generalized)  Unsteadiness on feet  Right arm weakness  Rationale for Evaluation and Treatment: Rehabilitation  SUBJECTIVE:   SUBJECTIVE STATEMENT:  Pt goes by "[name]." Pt reports using his RUE more recently and believes it is improving.  Pt accompanied by: self Pt's wife, [name], reports fine motor, gripping, cutting food have been difficult. She also reports patient is competitive and likes to know if he is doing well typically on a letter grade system. Pt likes to understand the why behind treatment.  PERTINENT HISTORY: HTN, COPD, insulin-dependent T2DM, HLD MRI brain was revealing for acute infarct of left pons during hospitalization in March. Repeat MRI brain 03/29/23 - IMPRESSION: Interval evolution of the now subacute infarct in the left paramedian pons. No new acute infarct or intracranial hemorrhage.  PRECAUTIONS: Fall  WEIGHT BEARING RESTRICTIONS: No  PAIN:  Are you having pain? No  FALLS: Has patient fallen in last 6 months? No  LIVING ENVIRONMENT: Lives with: lives with their spouse Lives in: House/apartment - Two level house with bedroom  and bathroom downstairs Stairs: Yes: Internal: 7 steps to landing then 7 more steps; left hand rail for 7 steps, then handrail on both sides for second set and External: 4 steps; on left going up Has following equipment at home: Single point cane, Walker - 2 wheeled, Wheelchair (transport), and Grab bars Bathroom setup: upstairs bathroom walk-in shower with small threshold with grab bars and built-in shower seat. Regular height  commode  PLOF:  ADLs independent, IADLs independent for what patient was responsible for, driving, retired Chief Operating Officer), patient has two adult children ([name] and [name]) , enjoys golfing, ride around in truck to visit friends  PATIENT GOALS: Pt wants to be "back to where I was in February" with no limitations/weakness.   OBJECTIVE:   HAND DOMINANCE: Right, however patient using left hand recently  ADLs:  Eating: Difficulty cutting food, mild difficulty holding cups pending the weight (drinks from [facility] with straw to prevent spilling), slower to not spill Grooming: Brushing teeth with left mostly, slow on the right, not using q-tips currently UB Dressing: Difficulty with buttons, but otherwise independent LB Dressing: Independent Toileting: Independent, mostly using left hand for wiping Bathing: independent Tub Shower transfers: Independent Equipment: Grab bars and Walk in shower  IADLs: Shopping: Wife now doing grocery shopping where as pt would before. Concern for fatigue, balance, and overhead reaching Light housekeeping: Wife performing, but pt would share responsibility prior (goal to fold clothes) Meal Prep: Wife did most of the cooking before. Does not want to address Community mobility: Utilizing SPC 70% of the time. Pt does not use in the house, but if he leaves the house at all. Medication management: Independent, now have pop up tops.  Financial management: Independent Handwriting: 75% legible, improved gripping of pen as well per his report.  MOBILITY STATUS: Independent with SPC  POSTURE COMMENTS:  No Significant postural limitations Sitting balance:  Independent  ACTIVITY TOLERANCE: Activity tolerance: Pt reports he would force himself to get through every aisle of the grocery store, but it would be difficult.   FUNCTIONAL OUTCOME MEASURES: FOTO: Completed 4/23 score 61  UPPER EXTREMITY ROM:    Active ROM Right eval Left eval  Shoulder flexion 100* WFL   Shoulder abduction 110* WFL  Shoulder adduction    Shoulder extension    Shoulder internal rotation Hip ht [facility]  Shoulder external rotation [facility]  Elbow flexion [facility] WFL  Elbow extension [facility] WFL  Wrist flexion 40* WFL  Wrist extension [facility] WFL  Wrist ulnar deviation    Wrist radial deviation    Wrist pronation [facility] WFL  Wrist supination WFL WFL  (Blank rows = not tested)  Grasp slightly worse of right compared to left  UPPER EXTREMITY MMT:     MMT Right eval Left eval  Shoulder flexion 4-   Shoulder abduction    Shoulder adduction    Shoulder extension 4-   Shoulder internal rotation    Shoulder external rotation    Middle trapezius    Lower trapezius    Elbow flexion 4-   Elbow extension 4-   Wrist flexion    Wrist extension    Wrist ulnar deviation    Wrist radial deviation    Wrist pronation    Wrist supination    (Blank rows = not tested)  Grasp 3+ right hand  HAND FUNCTION: Grip strength: Right: 40.7 lbs; Left: 79.3 lbs and Lateral pinch: Right: 8 lbs, Left: 10 lbs -04/28/23 3-pt pinch: Right 6lbs  COORDINATION: [address]  Peg test: Right: 85 sec; Left: 29 sec Box and Blocks:  Right 45 blocks, Left 52 blocks - 04/28/23  SENSATION: WFL per patient report; however, may benefit from stereognosis  EDEMA: None  MUSCLE TONE: Appears to be WFL  COGNITION: Overall cognitive status: Within functional limits for tasks assessed  VISION: Subjective report: No vision changes noted Baseline vision: No visual deficits  VISION ASSESSMENT: Not tested  Patient has difficulty with following activities due to following visual impairments: None  PERCEPTION: WFL  PRAXIS: WFL  OBSERVATIONS: Pt able to walk from lobby area to table utilizing SPC. No loss of balance. Pt frequently utilizing left hand over right, although notes being right handed. Pt well kept and has supportive spouse.  04/28/23: Pt ambulatory with SPC, no loss of balance, appears to hike hip a bit. Pt  well-kept. Wife present in lobby, but did not join patient.   TODAY'S TREATMENT:                                                                                                                              DATE:   Neuromuscular reeducation - Pt engaging in theraputty HEP following testing of grip and coordination. Pt requiring verbal, visual, and tactile cues to proper form. OT providing inhibitory technique to extensor and/or flexor forearm muscles as pt contracting proximal arm versus using distal hand/digit muscles required for fine motor grip/pinch activities. Exercises as listed below:  Exercises - Putty Squeezes  - 1 x daily - 3 x weekly - 2 sets - 10 reps - Tip Pinch with Putty  - 1 x daily - 3 x weekly - 2 sets - 10 reps - 3-Point Pinch with Putty  - 1 x daily - 3 x weekly - 2 sets - 10 reps - Key Pinch with Putty  - 1 x daily - 3 x weekly - 2 sets - 10 reps  PATIENT EDUCATION: Education details: Meaning of test scores related to function, theraputty HEP Person educated: Patient Education method: Explanation, Demonstration, Tactile cues, Verbal cues, and Handouts Education comprehension: verbalized understanding, returned demonstration, and needs further education  HOME EXERCISE PROGRAM: 04/28/23: Access Code: [email or web address] URL: [email or web address] Date: 04/28/2023 Prepared by: Wyn Forster Jayro Mcmath   GOALS: Goals reviewed with patient? Yes  SHORT TERM GOALS: Target date: 05/16/2023   Pt will demonstrate improved ease with fastening buttons as evidenced by decreasing 3 button/unbutton time by TBD seconds. Baseline: Wife assists with polo shirts, need to address time it takes to complete Goal status: INITIAL  2.  Pt will demonstrate improved fine motor coordination for ADLs as evidenced by decreasing 9 hole peg test score for Rt hand by 10 secs. Baseline: Rt - 85 sec Goal status: INITIAL  3.  Pt will improve Rt grip strength by >/=10 lbs next visit as needed for daily  activity. Baseline: Will test next visit 04/28/23: Rt 40.7 lbs Goal status: INITIAL  4.  Pt will improve Rt shoulder flexion  AROM to 120* required for overhead reaching tasks. Baseline: Rt shoulder flexion 100* Goal status: INITIAL  5.  Pt will improve handwriting legibility to 100% in order to write checks. Baseline: 75% legible Goal status: INITIAL  6.  Pt will complete functional activity task in standing for up to 10 minutes without therapeutic rest break to simulate shopping tasks. Baseline: Unable currently for grocery shopping Goal status: INITIAL  LONG TERM GOALS: Target date: 06/13/2023  Pt will complete FOTO assessment at time of discharge scoring 73 or greater indicating functional progression with ADL and IADL completion. Baseline: 61 Goal status: INITIAL  2.  Pt will demonstrate improved fine motor coordination for ADLs as evidenced by decreasing 9 hole peg test score for Rt hand by 25 secs Baseline: Rt - 85 sec Goal status: INITIAL  3.  Pt will be able to place at least 50 blocks using Rt hand with completion of Box and Blocks test. Baseline: Will test next visit 04/28/23: Rt 45 blocks Goal status: INITIAL  4.  Pt will improve Rt shoulder flexion AROM to 140* required for overhead reaching tasks. Baseline: Rt shoulder flexion 100* Goal status: INITIAL  5.  Pt will be independent with HEP. Baseline:  Goal status: INITIAL  6.  Pt will fold and put away basket of clothes independently. Baseline: Unable currently Goal status: INITIAL  ASSESSMENT:  CLINICAL IMPRESSION: Pt has improved in overall gross motor control via observation of RUE AROM and with pt subjective report. He states using right hand more for self-feeding and less spilling has occurred with more use. Pt limited by RUE functional use, impaired coordination and strength impacting daily activity. Pt would continue to benefit from skilled OT services to address deficits, and to maximize independence in  ADLs, IADLs, and leisure.  PERFORMANCE DEFICITS: in functional skills including ADLs, IADLs, coordination, dexterity, sensation, ROM, strength, Fine motor control, Gross motor control, mobility, balance, body mechanics, endurance, and UE functional use, and psychosocial skills including interpersonal interactions and routines and behaviors.   IMPAIRMENTS: are limiting patient from ADLs, IADLs, leisure, social participation, and driving .   CO-MORBIDITIES: may have co-morbidities  that affects occupational performance. Patient will benefit from skilled OT to address above impairments and improve overall function.  MODIFICATION OR ASSISTANCE TO COMPLETE EVALUATION: No modification of tasks or assist necessary to complete an evaluation.  OT OCCUPATIONAL PROFILE AND HISTORY: Detailed assessment: Review of records and additional review of physical, cognitive, psychosocial history related to current functional performance.  CLINICAL DECISION MAKING: Moderate - several treatment options, min-mod task modification necessary  REHAB POTENTIAL: Good  EVALUATION COMPLEXITY: Low    PLAN:  OT FREQUENCY: 2x/week  OT DURATION: 8 weeks  PLANNED INTERVENTIONS: self care/ADL training, therapeutic exercise, therapeutic activity, neuromuscular re-education, passive range of motion, gait training, balance training, functional mobility training, electrical stimulation, ultrasound, moist heat, cryotherapy, patient/family education, cognitive remediation/compensation, visual/perceptual remediation/compensation, psychosocial skills training, energy conservation, coping strategies training, DME and/or AE instructions, and Re-evaluation  RECOMMENDED OTHER SERVICES: None  CONSULTED AND AGREED WITH PLAN OF CARE: Patient and family member/caregiver  PLAN FOR NEXT SESSION: review theraputty HEP, stereognosis, RUE HEP, coordination exercises, inhibitory techniques to forearm to challenge hand/digits   [employer], OT 04/28/2023, 2:09 PM

## 2023-04-28 NOTE — Therapy (Signed)
OUTPATIENT PHYSICAL THERAPY NEURO TREATMENT   Patient Name: Ethan Boyd MRN: [id number] DOB:1956-03-15, 67 y.o., male Today's Date: 04/28/2023   PCP: Oliver Barre, MD REFERRING PROVIDER: Oliver Barre, MD  END OF SESSION:  PT End of Session - 04/28/23 1402     Visit Number 9    Number of Visits 17    Date for PT Re-Evaluation 06/09/23    Authorization Type BCBS    PT Start Time 1402   from OT session   PT Stop Time 1445    PT Time Calculation (min) 43 min    Equipment Utilized During Treatment Gait belt    Activity Tolerance Patient tolerated treatment well    Behavior During Therapy [facility] for tasks assessed/performed               Past Medical History:  Diagnosis Date   DIABETES MELLITUS, TYPE II 02/18/2009   Qualifier: Diagnosis of  By: Jonny Ruiz MD, Len Blalock    Diverticulosis 08/31/2012   Colonoscopy, June 10, 2009, Dr Perry/GI   ERECTILE DYSFUNCTION 02/18/2009   Qualifier: Diagnosis of  By: Jonny Ruiz MD, Len Blalock    Erectile dysfunction 09/07/2012   HYPERLIPIDEMIA 02/18/2009   Qualifier: Diagnosis of  By: Jonny Ruiz MD, Len Blalock    HYPERTENSION 02/18/2009   Qualifier: Diagnosis of  By: Jonny Ruiz MD, Len Blalock    Past Surgical History:  Procedure Laterality Date   COLONOSCOPY  06/10/2009   right knee cartilage  12/26/1974   Patient Active Problem List   Diagnosis Date Noted   Acute stroke due to ischemia (HCC) 03/28/2023   Right foot drop 03/28/2023   CVA (cerebral vascular accident) (HCC) 03/19/2023   Vitamin D deficiency 07/31/2022   COPD (chronic obstructive pulmonary disease) (HCC) 07/29/2022   Pulmonary nodule, left 08/09/2021   Abnormal physical evaluation 07/15/2021   Screen for colon cancer 07/15/2021   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   Type II diabetes mellitus with manifestations (HCC) 07/15/2021   Hyperlipidemia LDL goal <70 07/15/2021   Tachycardia 07/15/2021   PVC (premature ventricular contraction) 07/15/2021   Insulin dependent type 2 diabetes mellitus (HCC)  07/15/2021   Lung nodule 07/15/2021   EKG abnormality 09/07/2012   Erectile dysfunction 09/07/2012   Snoring 09/07/2012   Encounter for well adult exam with abnormal findings 08/31/2012   Diverticulosis 08/31/2012   Essential hypertension 02/18/2009    ONSET DATE: 03/19/23  REFERRING DIAG:  I63.9 (ICD-10-CM) - Acute stroke due to ischemia  M21.371 (ICD-10-CM) - Right foot drop    THERAPY DIAG:  Muscle weakness (generalized)  Unsteadiness on feet  Difficulty in walking, not elsewhere classified  Other abnormalities of gait and mobility  Rationale for Evaluation and Treatment: Rehabilitation  SUBJECTIVE:  SUBJECTIVE STATEMENT: Pt reports no acute changes and no falls since last session. No complaints of pain today. Pt reports he continues to work on his HEP to target strengthening of specific muscles in RLE to improve his walking.  Pt accompanied by: family member  PERTINENT HISTORY: HTN, COPD, insulin-dependent T2DM, HLD   PAIN:  Are you having pain? No  PRECAUTIONS: Fall  PATIENT GOALS: "walk normal"   OBJECTIVE:   DIAGNOSTIC FINDINGS: 03/29/23 brain MRI:  IMPRESSION: Interval evolution of the now subacute infarct in the left paramedian pons. No new acute infarct or intracranial hemorrhage.  TODAY'S TREATMENT:                                                                                                                               GAIT: Gait pattern:  vaulting with LLE, decreased hip/knee flexion- Right, and decreased ankle dorsiflexion- Right Distance walked: various clinic distances Assistive device utilized: None Level of assistance: Modified independence Comments: intermittent use of SPC   NMR:  For strengthening of RLE to improve gait mechanics: Resisted 6" and 12"  step taps at bottom of stairs with red theraband and BUE support 2 x 15 reps to 6" step 2 x 15 reps to 12" step   THERACT: For STG assessment:  [facility] PT Assessment - 04/28/23 1411       Ambulation/Gait   Gait velocity 32.8 ft over 9.9 sec = 3.31 ft/sec      Standardized Balance Assessment   Standardized Balance Assessment Berg Balance Test;Five Times Sit to Stand    Five times sit to stand comments  11.56 sec   no UE support     Berg Balance Test   Sit to Stand Able to stand without using hands and stabilize independently    Standing Unsupported Able to stand safely 2 minutes    Sitting with Back Unsupported but Feet Supported on Floor or Stool Able to sit safely and securely 2 minutes    Stand to Sit Sits safely with minimal use of hands    Transfers Able to transfer safely, minor use of hands    Standing Unsupported with Eyes Closed Able to stand 10 seconds safely    Standing Unsupported with Feet Together Able to place feet together independently and stand 1 minute safely    From Standing, Reach Forward with Outstretched Arm Can reach forward >12 cm safely (5")   9 inches   From Standing Position, Pick up Object from Floor Able to pick up shoe, needs supervision    From Standing Position, Turn to Look Behind Over each Shoulder Looks behind from both sides and weight shifts well    Turn 360 Degrees Able to turn 360 degrees safely in 4 seconds or less    Standing Unsupported, Alternately Place Feet on Step/Stool Able to complete 4 steps without aid or supervision    Standing Unsupported, One Foot in Front Able to take small step independently  and hold 30 seconds    Standing on One Leg Able to lift leg independently and hold equal to or more than 3 seconds    Total Score 48    Berg comment: 48/56, moderate fall risk              PATIENT EDUCATION: Education details: continue HEP, results of OM and functional implications Person educated: Patient Education method:  Explanation and Demonstration Education comprehension: verbalized understanding and needs further education  HOME EXERCISE PROGRAM: Access Code: [email or web address] URL: [email or web address] Date: 04/03/2023 Prepared by: Peter Congo  Exercises - Alternating Step Taps with Counter Support  - 1 x daily - 7 x weekly - 3 sets - 10 reps - Staggered Sit-to-Stand  - 1 x daily - 7 x weekly - 3 sets - 10 reps -Left arm supported on hand rail at stairs: lift your R foot onto the 1st step tapping your heel onto the step   -think "up and on" then "up and back" -lifting R leg up and over dumbbell or can of soup from a seated position   -emphasis on up and over and not around - Modified [employer]  - 1 x daily - 7 x weekly - 3 sets - 30-45s hold -standing lifting R toes off the floor  - Standing Knee Flexion AROM with Chair Support  - 1 x daily - 7 x weekly - 3 sets - 10 reps   GOALS: Goals reviewed with patient? Yes  SHORT TERM GOALS: Target date: 04/28/23  Pt will be independent with initial HEP for improved gait mechanics  Baseline: to be provided Goal status: MET  2.  Pt will improve gait speed to >/= .79m/s to demonstrate improved community ambulation  Baseline: .55m/s, 1.68m/s (5/3) Goal status: MET  3.  Pt will improve 5x STS to </= 13 sec to demo improved functional LE strength and balance   Baseline: 15.22s B UE, 11.56 sec no UE (5/3) Goal status: MET  4.  Pt will improve Berg score to 42/56 for decreased fall risk   Baseline: 38/56 (4/8), 48/56 (5/3) Goal status: MET   LONG TERM GOALS: Target date: 06/09/23  Pt will be independent with final HEP for improved gait mechanics  Baseline: to be provided  Goal status: INITIAL  2.  Pt will improve gait speed to >/= 1.1 m/s to demonstrate improved community ambulation  Baseline: .34m/s, 1.61m/s (5/3) Goal status: REVISED  3.  Pt will improve 5x STS to </= 10 sec to demo improved functional LE strength and balance    Baseline: 15.22s B UE, 11.56 sec no UE (5/3) Goal status: INITIAL  4.  Pt will improve Berg score to 45/56 for decreased fall risk  Baseline: 38/56 (4/8), 48/56 (5/3) Goal status: MET  5.  Patient will improve FOTO score to >/= 69 to demonstrate functional improvement Baseline: 52, 68.74 (5/3) Goal status: INITIAL  ASSESSMENT:  CLINICAL IMPRESSION: Emphasis of skilled PT session STG assessment and working on RLE NMR and strengthening in order to improve gait mechanics. Pt has met 4/4 STG due to being independent with his initial HEP, improving his gait speed to 1.0 m/s from 0.82 m/s, improving his 5xSTS score from 15.22 sec to 11.56 sec with progression to not needing any UE support to complete, and has improved his Berg score from 38/56 to 48/56. Overall pt demonstrates improved balance, decreased fall risk, and improved functional strength. Pt does continue to exhibit above-mentioned gait deviations with his RLE and continues  to benefit from skilled therapy services to address these gait impairments and improve his safety and independence with functional mobility. Continue POC.   OBJECTIVE IMPAIRMENTS: Abnormal gait, decreased balance, decreased coordination, decreased endurance, decreased knowledge of condition, decreased knowledge of use of DME, decreased strength, impaired tone, and impaired UE functional use.   ACTIVITY LIMITATIONS: carrying, lifting, standing, squatting, stairs, transfers, locomotion level, and caring for others  PARTICIPATION LIMITATIONS: meal prep, cleaning, interpersonal relationship, driving, shopping, community activity, occupation, and yard work  PERSONAL FACTORS: Age, Fitness, Past/current experiences, Time since onset of injury/illness/exacerbation, Transportation, and 3+ comorbidities: see above  are also affecting patient's functional outcome.   REHAB POTENTIAL: Good  CLINICAL DECISION MAKING: Stable/uncomplicated  EVALUATION COMPLEXITY:  Low  PLAN:  PT FREQUENCY: 2x/week  PT DURATION: 8 weeks  PLANNED INTERVENTIONS: Therapeutic exercises, Therapeutic activity, Neuromuscular re-education, Balance training, Gait training, Patient/Family education, Self Care, Joint mobilization, Stair training, Vestibular training, Canalith repositioning, Visual/preceptual remediation/compensation, Orthotic/Fit training, DME instructions, Aquatic Therapy, Dry Needling, Electrical stimulation, Taping, Manual therapy, and Re-evaluation  PLAN FOR NEXT SESSION: 10th PN, add to HEP, gait with SPC, minimizing compensatory strategies, use of AFO? Quadruped, anything moving/strengthening outside of R LE extensor tone, resisted gait?, ankle strategy, stepping strategy   Peter Congo, PT, DPT, CSRS 04/28/2023, 2:45 PM

## 2023-05-02 ENCOUNTER — Ambulatory Visit: Payer: Medicare Other

## 2023-05-02 DIAGNOSIS — R278 Other lack of coordination: Secondary | ICD-10-CM

## 2023-05-02 DIAGNOSIS — R262 Difficulty in walking, not elsewhere classified: Secondary | ICD-10-CM

## 2023-05-02 DIAGNOSIS — R2681 Unsteadiness on feet: Secondary | ICD-10-CM

## 2023-05-02 DIAGNOSIS — R2689 Other abnormalities of gait and mobility: Secondary | ICD-10-CM

## 2023-05-02 DIAGNOSIS — M6281 Muscle weakness (generalized): Secondary | ICD-10-CM

## 2023-05-02 NOTE — Therapy (Signed)
OUTPATIENT PHYSICAL THERAPY NEURO TREATMENT   Patient Name: Jsean Stockham MRN: [id number] DOB:02-27-1956, 67 y.o., male Today's Date: 05/02/2023   PCP: Oliver Barre, MD REFERRING PROVIDER: Oliver Barre, MD  Physical Therapy Progress Note   Dates of Reporting Period:03/31/23-05/02/23  See Note below for Objective Data and Assessment of Progress/Goals.  Thank you for the referral of this patient. Westley Foots, PT, DPT, CBIS  END OF SESSION:  PT End of Session - 05/02/23 1350     Visit Number 10    Number of Visits 17    Date for PT Re-Evaluation 06/09/23    Authorization Type BC[employer]    PT Start Time 1400    PT Stop Time 1445    PT Time Calculation (min) 45 min    Equipment Utilized During Treatment Gait belt    Activity Tolerance Patient tolerated treatment well    Behavior During Therapy WFL for tasks assessed/performed               Past Medical History:  Diagnosis Date   DIABETES MELLITUS, TYPE II 02/18/2009   Qualifier: Diagnosis of  By: Jonny Ruiz MD, Len Blalock    Diverticulosis 08/31/2012   Colonoscopy, June 10, 2009, Dr Perry/GI   ERECTILE DYSFUNCTION 02/18/2009   Qualifier: Diagnosis of  By: Jonny Ruiz MD, Len Blalock    Erectile dysfunction 09/07/2012   HYPERLIPIDEMIA 02/18/2009   Qualifier: Diagnosis of  By: Jonny Ruiz MD, Len Blalock    HYPERTENSION 02/18/2009   Qualifier: Diagnosis of  By: Jonny Ruiz MD, Len Blalock    Past Surgical History:  Procedure Laterality Date   COLONOSCOPY  06/10/2009   right knee cartilage  12/26/1974   Patient Active Problem List   Diagnosis Date Noted   Acute stroke due to ischemia (HCC) 03/28/2023   Right foot drop 03/28/2023   CVA (cerebral vascular accident) (HCC) 03/19/2023   Vitamin D deficiency 07/31/2022   COPD (chronic obstructive pulmonary disease) (HCC) 07/29/2022   Pulmonary nodule, left 08/09/2021   Abnormal physical evaluation 07/15/2021   Screen for colon cancer 07/15/2021   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   Type II diabetes mellitus  with manifestations (HCC) 07/15/2021   Hyperlipidemia LDL goal <70 07/15/2021   Tachycardia 07/15/2021   PVC (premature ventricular contraction) 07/15/2021   Insulin dependent type 2 diabetes mellitus (HCC) 07/15/2021   Lung nodule 07/15/2021   EKG abnormality 09/07/2012   Erectile dysfunction 09/07/2012   Snoring 09/07/2012   Encounter for well adult exam with abnormal findings 08/31/2012   Diverticulosis 08/31/2012   Essential hypertension 02/18/2009    ONSET DATE: 03/19/23  REFERRING DIAG:  I63.9 (ICD-10-CM) - Acute stroke due to ischemia  M21.371 (ICD-10-CM) - Right foot drop    THERAPY DIAG:  Other lack of coordination  Muscle weakness (generalized)  Difficulty in walking, not elsewhere classified  Unsteadiness on feet  Other abnormalities of gait and mobility  Rationale for Evaluation and Treatment: Rehabilitation  SUBJECTIVE:  SUBJECTIVE STATEMENT: Patient reports doing well. Attempting to ambulate into clinic without AD, but demonstrating more gait deviations including vaulting and R foot flat contact. Denies falls/near falls.   Pt accompanied by: family member  PERTINENT HISTORY: HTN, COPD, insulin-dependent T2DM, HLD   PAIN:  Are you having pain? No  PRECAUTIONS: Fall  PATIENT GOALS: "walk normal"   OBJECTIVE:   DIAGNOSTIC FINDINGS: 03/29/23 brain MRI:  IMPRESSION: Interval evolution of the now subacute infarct in the left paramedian pons. No new acute infarct or intracranial hemorrhage.  TODAY'S TREATMENT:                                                                                                                               GAIT: Gait pattern:  vaulting with LLE, decreased hip/knee flexion- Right, and decreased ankle dorsiflexion- Right Distance walked:  various clinic distances Assistive device utilized: Single point cane and None Level of assistance: Modified independence Comments: intermittent use of SPC  -Trial 3/8th" heel wedge + thusane AFO with mild resolution of L vaulting and small squat to achieve R heel contact NMR: -resisted D2 R LE to 1st step with AFO on    R LE 1/4" shorter than L with known R knee injury resulting in slight R knee flexion contracture `-denies limp/gait deficits at baseline  -extensive conversation regarding tibialis anterior muscle function and implications on gait    PATIENT EDUCATION: Education details: continue HEP, see above Person educated: Patient Education method: Medical illustrator Education comprehension: verbalized understanding and needs further education  HOME EXERCISE PROGRAM: Access Code: [email or web address] URL: [email or web address] Date: 04/03/2023 Prepared by: Peter Congo  Exercises - Alternating Step Taps with Counter Support  - 1 x daily - 7 x weekly - 3 sets - 10 reps - Staggered Sit-to-Stand  - 1 x daily - 7 x weekly - 3 sets - 10 reps -Left arm supported on hand rail at stairs: lift your R foot onto the 1st step tapping your heel onto the step   -think "up and on" then "up and back" -lifting R leg up and over dumbbell or can of soup from a seated position   -emphasis on up and over and not around - Modified [employer]  - 1 x daily - 7 x weekly - 3 sets - 30-45s hold -standing lifting R toes off the floor  - Standing Knee Flexion AROM with Chair Support  - 1 x daily - 7 x weekly - 3 sets - 10 reps   GOALS: Goals reviewed with patient? Yes  SHORT TERM GOALS: Target date: 04/28/23  Pt will be independent with initial HEP for improved gait mechanics  Baseline: to be provided Goal status: MET  2.  Pt will improve gait speed to >/= .6m/s to demonstrate improved community ambulation  Baseline: .51m/s, 1.21m/s (5/3) Goal status: MET  3.  Pt will  improve 5x STS to </= 13 sec to demo improved functional LE strength  and balance   Baseline: 15.22s B UE, 11.56 sec no UE (5/3) Goal status: MET  4.  Pt will improve Berg score to 42/56 for decreased fall risk   Baseline: 38/56 (4/8), 48/56 (5/3) Goal status: MET   LONG TERM GOALS: Target date: 06/09/23  Pt will be independent with final HEP for improved gait mechanics  Baseline: to be provided  Goal status: INITIAL  2.  Pt will improve gait speed to >/= 1.1 m/s to demonstrate improved community ambulation  Baseline: .41m/s, 1.78m/s (5/3) Goal status: REVISED  3.  Pt will improve 5x STS to </= 10 sec to demo improved functional LE strength and balance   Baseline: 15.22s B UE, 11.56 sec no UE (5/3) Goal status: INITIAL  4.  Pt will improve Berg score to 45/56 for decreased fall risk  Baseline: 38/56 (4/8), 48/56 (5/3) Goal status: MET  5.  Patient will improve FOTO score to >/= 69 to demonstrate functional improvement Baseline: 52, 68.74 (5/3) Goal status: INITIAL  ASSESSMENT:  CLINICAL IMPRESSION: Patient seen for skilled PT session with emphasis on R LE NMR. Patient reports he prefers to ambulate without AD, but known gait deviations do seem to increase without use of AD. Patient remains resistant despite education. Patients gait deviations are slightly minimized by use of heel wedge and AFO, which helps prevent onset of R LE extensor tone. Continue POC.    OBJECTIVE IMPAIRMENTS: Abnormal gait, decreased balance, decreased coordination, decreased endurance, decreased knowledge of condition, decreased knowledge of use of DME, decreased strength, impaired tone, and impaired UE functional use.   ACTIVITY LIMITATIONS: carrying, lifting, standing, squatting, stairs, transfers, locomotion level, and caring for others  PARTICIPATION LIMITATIONS: meal prep, cleaning, interpersonal relationship, driving, shopping, community activity, occupation, and yard work  PERSONAL FACTORS:  Age, Fitness, Past/current experiences, Time since onset of injury/illness/exacerbation, Transportation, and 3+ comorbidities: see above  are also affecting patient's functional outcome.   REHAB POTENTIAL: Good  CLINICAL DECISION MAKING: Stable/uncomplicated  EVALUATION COMPLEXITY: Low  PLAN:  PT FREQUENCY: 2x/week  PT DURATION: 8 weeks  PLANNED INTERVENTIONS: Therapeutic exercises, Therapeutic activity, Neuromuscular re-education, Balance training, Gait training, Patient/Family education, Self Care, Joint mobilization, Stair training, Vestibular training, Canalith repositioning, Visual/preceptual remediation/compensation, Orthotic/Fit training, DME instructions, Aquatic Therapy, Dry Needling, Electrical stimulation, Taping, Manual therapy, and Re-evaluation  PLAN FOR NEXT SESSION: 10th PN, add to HEP, gait with SPC, minimizing compensatory strategies, use of AFO? Quadruped, anything moving/strengthening outside of R LE extensor tone, resisted gait?, ankle strategy, stepping strategy   Westley Foots, PT, DPT, CSRS 05/02/2023, 3:49 PM

## 2023-05-05 ENCOUNTER — Ambulatory Visit: Payer: Medicare Other | Admitting: Physical Therapy

## 2023-05-05 ENCOUNTER — Ambulatory Visit: Payer: Medicare Other

## 2023-05-05 DIAGNOSIS — R2689 Other abnormalities of gait and mobility: Secondary | ICD-10-CM

## 2023-05-05 DIAGNOSIS — R262 Difficulty in walking, not elsewhere classified: Secondary | ICD-10-CM

## 2023-05-05 DIAGNOSIS — R278 Other lack of coordination: Secondary | ICD-10-CM

## 2023-05-05 DIAGNOSIS — R2681 Unsteadiness on feet: Secondary | ICD-10-CM

## 2023-05-05 DIAGNOSIS — M6281 Muscle weakness (generalized): Secondary | ICD-10-CM

## 2023-05-05 NOTE — Therapy (Signed)
OUTPATIENT PHYSICAL THERAPY NEURO TREATMENT   Patient Name: Ethan Boyd MRN: [id number] DOB:11-16-1956, 67 y.o., male Today's Date: 05/05/2023   PCP: Oliver Barre, MD REFERRING PROVIDER: Oliver Barre, MD   END OF SESSION:  PT End of Session - 05/05/23 1523     Visit Number 11    Number of Visits 17    Date for PT Re-Evaluation 06/09/23    Authorization Type BCBS    PT Start Time 1522    PT Stop Time 1606    PT Time Calculation (min) 44 min    Equipment Utilized During Treatment Gait belt    Activity Tolerance Patient tolerated treatment well    Behavior During Therapy WFL for tasks assessed/performed                Past Medical History:  Diagnosis Date   DIABETES MELLITUS, TYPE II 02/18/2009   Qualifier: Diagnosis of  By: Jonny Ruiz MD, Len Blalock    Diverticulosis 08/31/2012   Colonoscopy, June 10, 2009, Dr Perry/GI   ERECTILE DYSFUNCTION 02/18/2009   Qualifier: Diagnosis of  By: Jonny Ruiz MD, Len Blalock    Erectile dysfunction 09/07/2012   HYPERLIPIDEMIA 02/18/2009   Qualifier: Diagnosis of  By: Jonny Ruiz MD, Len Blalock    HYPERTENSION 02/18/2009   Qualifier: Diagnosis of  By: Jonny Ruiz MD, Len Blalock    Past Surgical History:  Procedure Laterality Date   COLONOSCOPY  06/10/2009   right knee cartilage  12/26/1974   Patient Active Problem List   Diagnosis Date Noted   Acute stroke due to ischemia (HCC) 03/28/2023   Right foot drop 03/28/2023   CVA (cerebral vascular accident) (HCC) 03/19/2023   Vitamin D deficiency 07/31/2022   COPD (chronic obstructive pulmonary disease) (HCC) 07/29/2022   Pulmonary nodule, left 08/09/2021   Abnormal physical evaluation 07/15/2021   Screen for colon cancer 07/15/2021   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   Type II diabetes mellitus with manifestations (HCC) 07/15/2021   Hyperlipidemia LDL goal <70 07/15/2021   Tachycardia 07/15/2021   PVC (premature ventricular contraction) 07/15/2021   Insulin dependent type 2 diabetes mellitus (HCC) 07/15/2021    Lung nodule 07/15/2021   EKG abnormality 09/07/2012   Erectile dysfunction 09/07/2012   Snoring 09/07/2012   Encounter for well adult exam with abnormal findings 08/31/2012   Diverticulosis 08/31/2012   Essential hypertension 02/18/2009    ONSET DATE: 03/19/23  REFERRING DIAG:  I63.9 (ICD-10-CM) - Acute stroke due to ischemia  M21.371 (ICD-10-CM) - Right foot drop    THERAPY DIAG:  Other lack of coordination  Muscle weakness (generalized)  Difficulty in walking, not elsewhere classified  Unsteadiness on feet  Other abnormalities of gait and mobility  Rationale for Evaluation and Treatment: Rehabilitation  SUBJECTIVE:  SUBJECTIVE STATEMENT: Pt reports no pain today, no falls, no acute changes since last visit.  Pt accompanied by: family member  PERTINENT HISTORY: HTN, COPD, insulin-dependent T2DM, HLD   PAIN:  Are you having pain? No  PRECAUTIONS: Fall  PATIENT GOALS: "walk normal"   OBJECTIVE:   DIAGNOSTIC FINDINGS: 03/29/23 brain MRI:  IMPRESSION: Interval evolution of the now subacute infarct in the left paramedian pons. No new acute infarct or intracranial hemorrhage.  TODAY'S TREATMENT:                                                                                                                               NMR: Tall-kneeling with bench on mat table: Lateral sidstepping L/R 6 x 5 ft each direction Added in blue resistance band 6 x 5 ft each direction Tried minisquats but has R knee pain from past injury and is unable to tolerate position  Standing balance with GTB around ankles performing alt L/R colored dot taps in semi-circle with CGA Standing balance on airex performing alt L/R colored dot taps in semi-circle with min A (more challenging, several LOB) Standing on  airex performing dot taps anteriorly x 10 reps B Standing on level ground performing alt L/R anterior dot taps crossing midline, x 10 reps B  Resisted gait with green band x 345 ft with focus on multidirectional perturbation; progression from cross over stepping to stepping out to catch balance   PATIENT EDUCATION: Education details: continue HEP Person educated: Patient Education method: Medical illustrator Education comprehension: verbalized understanding and needs further education  HOME EXERCISE PROGRAM: Access Code: [email or web address] URL: [email or web address] Date: 04/03/2023 Prepared by: Peter Congo  Exercises - Alternating Step Taps with Counter Support  - 1 x daily - 7 x weekly - 3 sets - 10 reps - Staggered Sit-to-Stand  - 1 x daily - 7 x weekly - 3 sets - 10 reps -Left arm supported on hand rail at stairs: lift your R foot onto the 1st step tapping your heel onto the step   -think "up and on" then "up and back" -lifting R leg up and over dumbbell or can of soup from a seated position   -emphasis on up and over and not around - Modified [employer]  - 1 x daily - 7 x weekly - 3 sets - 30-45s hold -standing lifting R toes off the floor  - Standing Knee Flexion AROM with Chair Support  - 1 x daily - 7 x weekly - 3 sets - 10 reps   GOALS: Goals reviewed with patient? Yes  SHORT TERM GOALS: Target date: 04/28/23  Pt will be independent with initial HEP for improved gait mechanics  Baseline: to be provided Goal status: MET  2.  Pt will improve gait speed to >/= .40m/s to demonstrate improved community ambulation  Baseline: .54m/s, 1.33m/s (5/3) Goal status: MET  3.  Pt will improve 5x STS to </= 13 sec to demo improved  functional LE strength and balance   Baseline: 15.22s B UE, 11.56 sec no UE (5/3) Goal status: MET  4.  Pt will improve Berg score to 42/56 for decreased fall risk   Baseline: 38/56 (4/8), 48/56 (5/3) Goal status: MET   LONG  TERM GOALS: Target date: 06/09/23  Pt will be independent with final HEP for improved gait mechanics  Baseline: to be provided  Goal status: INITIAL  2.  Pt will improve gait speed to >/= 1.1 m/s to demonstrate improved community ambulation  Baseline: .30m/s, 1.62m/s (5/3) Goal status: REVISED  3.  Pt will improve 5x STS to </= 10 sec to demo improved functional LE strength and balance   Baseline: 15.22s B UE, 11.56 sec no UE (5/3) Goal status: INITIAL  4.  Pt will improve Berg score to 45/56 for decreased fall risk  Baseline: 38/56 (4/8), 48/56 (5/3) Goal status: MET  5.  Patient will improve FOTO score to >/= 69 to demonstrate functional improvement Baseline: 52, 68.74 (5/3) Goal status: INITIAL  ASSESSMENT:  CLINICAL IMPRESSION: Emphasis of skilled PT session on continuing to work on RLE NMR for improved control of limb for improved gait mechanics. Pt most challenged by dynamic standing balance on airex this date and with resisted gait for stepping strategies. Pt does exhibit progression from cross-over stepping to stepping out to catch his balance. Pt continues to benefit from skilled therapy services to work towards LTGs. Continue POC.    OBJECTIVE IMPAIRMENTS: Abnormal gait, decreased balance, decreased coordination, decreased endurance, decreased knowledge of condition, decreased knowledge of use of DME, decreased strength, impaired tone, and impaired UE functional use.   ACTIVITY LIMITATIONS: carrying, lifting, standing, squatting, stairs, transfers, locomotion level, and caring for others  PARTICIPATION LIMITATIONS: meal prep, cleaning, interpersonal relationship, driving, shopping, community activity, occupation, and yard work  PERSONAL FACTORS: Age, Fitness, Past/current experiences, Time since onset of injury/illness/exacerbation, Transportation, and 3+ comorbidities: see above  are also affecting patient's functional outcome.   REHAB POTENTIAL: Good  CLINICAL  DECISION MAKING: Stable/uncomplicated  EVALUATION COMPLEXITY: Low  PLAN:  PT FREQUENCY: 2x/week  PT DURATION: 8 weeks  PLANNED INTERVENTIONS: Therapeutic exercises, Therapeutic activity, Neuromuscular re-education, Balance training, Gait training, Patient/Family education, Self Care, Joint mobilization, Stair training, Vestibular training, Canalith repositioning, Visual/preceptual remediation/compensation, Orthotic/Fit training, DME instructions, Aquatic Therapy, Dry Needling, Electrical stimulation, Taping, Manual therapy, and Re-evaluation  PLAN FOR NEXT SESSION: add to HEP, gait with SPC, minimizing compensatory strategies, use of AFO? Quadruped, anything moving/strengthening outside of R LE extensor tone, resisted gait?, ankle strategy, stepping strategy   Peter Congo, PT, DPT, CSRS 05/05/2023, 4:08 PM

## 2023-05-09 ENCOUNTER — Ambulatory Visit: Payer: Medicare Other

## 2023-05-09 ENCOUNTER — Ambulatory Visit: Payer: Medicare Other | Admitting: Physical Therapy

## 2023-05-09 DIAGNOSIS — R2689 Other abnormalities of gait and mobility: Secondary | ICD-10-CM

## 2023-05-09 DIAGNOSIS — M6281 Muscle weakness (generalized): Secondary | ICD-10-CM

## 2023-05-09 DIAGNOSIS — R2681 Unsteadiness on feet: Secondary | ICD-10-CM

## 2023-05-09 DIAGNOSIS — R262 Difficulty in walking, not elsewhere classified: Secondary | ICD-10-CM

## 2023-05-09 DIAGNOSIS — R278 Other lack of coordination: Secondary | ICD-10-CM

## 2023-05-09 NOTE — Therapy (Signed)
OUTPATIENT PHYSICAL THERAPY NEURO TREATMENT   Patient Name: Ethan Boyd MRN: [id number] DOB:05-14-1956, 67 y.o., male Today's Date: 05/09/2023   PCP: Oliver Barre, MD REFERRING PROVIDER: Oliver Barre, MD   END OF SESSION:  PT End of Session - 05/09/23 1401     Visit Number 12    Number of Visits 17    Date for PT Re-Evaluation 06/09/23    Authorization Type BCBS    PT Start Time 1400    PT Stop Time 1446    PT Time Calculation (min) 46 min    Equipment Utilized During Treatment Gait belt    Activity Tolerance Patient tolerated treatment well    Behavior During Therapy WFL for tasks assessed/performed                 Past Medical History:  Diagnosis Date   DIABETES MELLITUS, TYPE II 02/18/2009   Qualifier: Diagnosis of  By: Jonny Ruiz MD, Len Blalock    Diverticulosis 08/31/2012   Colonoscopy, June 10, 2009, Dr Perry/GI   ERECTILE DYSFUNCTION 02/18/2009   Qualifier: Diagnosis of  By: Jonny Ruiz MD, Len Blalock    Erectile dysfunction 09/07/2012   HYPERLIPIDEMIA 02/18/2009   Qualifier: Diagnosis of  By: Jonny Ruiz MD, Len Blalock    HYPERTENSION 02/18/2009   Qualifier: Diagnosis of  By: Jonny Ruiz MD, Len Blalock    Past Surgical History:  Procedure Laterality Date   COLONOSCOPY  06/10/2009   right knee cartilage  12/26/1974   Patient Active Problem List   Diagnosis Date Noted   Acute stroke due to ischemia (HCC) 03/28/2023   Right foot drop 03/28/2023   CVA (cerebral vascular accident) (HCC) 03/19/2023   Vitamin D deficiency 07/31/2022   COPD (chronic obstructive pulmonary disease) (HCC) 07/29/2022   Pulmonary nodule, left 08/09/2021   Abnormal physical evaluation 07/15/2021   Screen for colon cancer 07/15/2021   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   Type II diabetes mellitus with manifestations (HCC) 07/15/2021   Hyperlipidemia LDL goal <70 07/15/2021   Tachycardia 07/15/2021   PVC (premature ventricular contraction) 07/15/2021   Insulin dependent type 2 diabetes mellitus (HCC) 07/15/2021    Lung nodule 07/15/2021   EKG abnormality 09/07/2012   Erectile dysfunction 09/07/2012   Snoring 09/07/2012   Encounter for well adult exam with abnormal findings 08/31/2012   Diverticulosis 08/31/2012   Essential hypertension 02/18/2009    ONSET DATE: 03/19/23  REFERRING DIAG:  I63.9 (ICD-10-CM) - Acute stroke due to ischemia  M21.371 (ICD-10-CM) - Right foot drop    THERAPY DIAG:  Other lack of coordination  Muscle weakness (generalized)  Difficulty in walking, not elsewhere classified  Unsteadiness on feet  Other abnormalities of gait and mobility  Rationale for Evaluation and Treatment: Rehabilitation  SUBJECTIVE:  SUBJECTIVE STATEMENT: Pt reports no falls or other acute changes since last visit.  Pt accompanied by: self (driving himself to PT now)  PERTINENT HISTORY: HTN, COPD, insulin-dependent T2DM, HLD   PAIN:  Are you having pain? No  PRECAUTIONS: Fall  PATIENT GOALS: "walk normal"   OBJECTIVE:   DIAGNOSTIC FINDINGS: 03/29/23 brain MRI:  IMPRESSION: Interval evolution of the now subacute infarct in the left paramedian pons. No new acute infarct or intracranial hemorrhage.  TODAY'S TREATMENT:                                                                                                                               NMR: Static standing balance in // bars with no UE support and CGA: Stance on flat surface of Bosu 4 x 45 sec each; multidirectional sway but no LOB  Gait:  Pt noted to have increased R ankle inversion and decreased R ankle DF with increased instances of his foot catching during gait while entering the clinic this date.  Gait pattern: decreased hip/knee flexion- Right and decreased ankle dorsiflexion- Right Distance walked: 230 ft Assistive device  utilized: Single point cane (intermittent use) Level of assistance: Modified independence Comments: with use of R Thusane PLS; improved R ankle DF during gait, decreased vaulting noted with LLE with use of brace  Gait pattern: decreased hip/knee flexion- Right and decreased ankle dorsiflexion- Right Distance walked: 230 ft Assistive device utilized: Single point cane (intermittent use) Level of assistance: Modified independence Comments: with R Thusane PLS and heel wedge, no real difference noted with use of heel wedge this session  Educated pt on purpose of AFO and process for obtaining an AFO. Pt declines to pursue obtaining an AFO at this time. Pt does continue to exhibit occasional catching of his RLE during gait with AFO but significantly reduced vs gait with no AFO.   PATIENT EDUCATION: Education details: continue HEP, purpose of AFO and process for obtaining AFO Person educated: Patient Education method: Medical illustrator Education comprehension: verbalized understanding and needs further education  HOME EXERCISE PROGRAM: Access Code: [email or web address] URL: [email or web address] Date: 04/03/2023 Prepared by: Peter Congo  Exercises - Alternating Step Taps with Counter Support  - 1 x daily - 7 x weekly - 3 sets - 10 reps - Staggered Sit-to-Stand  - 1 x daily - 7 x weekly - 3 sets - 10 reps -Left arm supported on hand rail at stairs: lift your R foot onto the 1st step tapping your heel onto the step   -think "up and on" then "up and back" -lifting R leg up and over dumbbell or can of soup from a seated position   -emphasis on up and over and not around - Modified [employer]  - 1 x daily - 7 x weekly - 3 sets - 30-45s hold -standing lifting R toes off the floor  - Standing Knee Flexion AROM with Chair Support  - 1 x daily -  7 x weekly - 3 sets - 10 reps   GOALS: Goals reviewed with patient? Yes  SHORT TERM GOALS: Target date: 04/28/23  Pt will be  independent with initial HEP for improved gait mechanics  Baseline: to be provided Goal status: MET  2.  Pt will improve gait speed to >/= .89m/s to demonstrate improved community ambulation  Baseline: .74m/s, 1.81m/s (5/3) Goal status: MET  3.  Pt will improve 5x STS to </= 13 sec to demo improved functional LE strength and balance   Baseline: 15.22s B UE, 11.56 sec no UE (5/3) Goal status: MET  4.  Pt will improve Berg score to 42/56 for decreased fall risk   Baseline: 38/56 (4/8), 48/56 (5/3) Goal status: MET   LONG TERM GOALS: Target date: 06/09/23  Pt will be independent with final HEP for improved gait mechanics  Baseline: to be provided  Goal status: INITIAL  2.  Pt will improve gait speed to >/= 1.1 m/s to demonstrate improved community ambulation  Baseline: .70m/s, 1.47m/s (5/3) Goal status: REVISED  3.  Pt will improve 5x STS to </= 10 sec to demo improved functional LE strength and balance   Baseline: 15.22s B UE, 11.56 sec no UE (5/3) Goal status: INITIAL  4.  Pt will improve Berg score to 45/56 for decreased fall risk  Baseline: 38/56 (4/8), 48/56 (5/3) Goal status: MET  5.  Patient will improve FOTO score to >/= 69 to demonstrate functional improvement Baseline: 52, 68.74 (5/3) Goal status: INITIAL  ASSESSMENT:  CLINICAL IMPRESSION: Emphasis of skilled PT session on assessing gait with and without AFO and heel wedge and continuing to work on balance strategies. Pt does exhibit improved gait with improved RLE control, improved RLE DF and foot clearance, and decreased compensations with LLE with use of AFO as compared to gait with no AFO. Re-educated pt on purpose of AFO, process for obtaining one, and that therapy continues to recommend that he get one for improved safety and balance with functional mobility. Pt to discuss with his wife and then let therapist know if he is agreeable to pursue getting an AFO. Pt continues to benefit from skilled therapy  services to work towards LTGs. Continue POC.   OBJECTIVE IMPAIRMENTS: Abnormal gait, decreased balance, decreased coordination, decreased endurance, decreased knowledge of condition, decreased knowledge of use of DME, decreased strength, impaired tone, and impaired UE functional use.   ACTIVITY LIMITATIONS: carrying, lifting, standing, squatting, stairs, transfers, locomotion level, and caring for others  PARTICIPATION LIMITATIONS: meal prep, cleaning, interpersonal relationship, driving, shopping, community activity, occupation, and yard work  PERSONAL FACTORS: Age, Fitness, Past/current experiences, Time since onset of injury/illness/exacerbation, Transportation, and 3+ comorbidities: see above  are also affecting patient's functional outcome.   REHAB POTENTIAL: Good  CLINICAL DECISION MAKING: Stable/uncomplicated  EVALUATION COMPLEXITY: Low  PLAN:  PT FREQUENCY: 2x/week  PT DURATION: 8 weeks  PLANNED INTERVENTIONS: Therapeutic exercises, Therapeutic activity, Neuromuscular re-education, Balance training, Gait training, Patient/Family education, Self Care, Joint mobilization, Stair training, Vestibular training, Canalith repositioning, Visual/preceptual remediation/compensation, Orthotic/Fit training, DME instructions, Aquatic Therapy, Dry Needling, Electrical stimulation, Taping, Manual therapy, and Re-evaluation  PLAN FOR NEXT SESSION: add to HEP, gait with SPC, minimizing compensatory strategies, use of AFO? Quadruped, anything moving/strengthening outside of R LE extensor tone, resisted gait?, ankle strategy, stepping strategy, BOSU ball toss, pt to talk with wife about if he wants to pursue getting an AFO   Peter Congo, PT, DPT, CSRS 05/09/2023, 2:47 PM

## 2023-05-11 ENCOUNTER — Ambulatory Visit: Payer: Medicare Other

## 2023-05-11 DIAGNOSIS — R29898 Other symptoms and signs involving the musculoskeletal system: Secondary | ICD-10-CM

## 2023-05-11 DIAGNOSIS — M25611 Stiffness of right shoulder, not elsewhere classified: Secondary | ICD-10-CM

## 2023-05-11 DIAGNOSIS — R2681 Unsteadiness on feet: Secondary | ICD-10-CM

## 2023-05-11 DIAGNOSIS — M6281 Muscle weakness (generalized): Secondary | ICD-10-CM

## 2023-05-11 DIAGNOSIS — R278 Other lack of coordination: Secondary | ICD-10-CM

## 2023-05-11 NOTE — Therapy (Signed)
OUTPATIENT OCCUPATIONAL THERAPY NEURO TREATMENT  Patient Name: Ethan Boyd MRN: [id number] DOB:11/13/1956, 67 y.o., male Today's Date: 05/11/2023  PCP: Corwin Levins, MD REFERRING PROVIDER: Corwin Levins, MD  END OF SESSION:  OT End of Session - 05/11/23 1018     Visit Number 3    Number of Visits 16    Authorization Type Medicare A&B / BCBS    OT Start Time 1018    OT Stop Time 1058    OT Time Calculation (min) 40 min    Equipment Utilized During Treatment single point cane    Activity Tolerance Patient tolerated treatment well    Behavior During Therapy [facility] for tasks assessed/performed             Past Medical History:  Diagnosis Date   DIABETES MELLITUS, TYPE II 02/18/2009   Qualifier: Diagnosis of  By: Jonny Ruiz MD, Len Blalock    Diverticulosis 08/31/2012   Colonoscopy, June 10, 2009, Dr Perry/GI   ERECTILE DYSFUNCTION 02/18/2009   Qualifier: Diagnosis of  By: Jonny Ruiz MD, Len Blalock    Erectile dysfunction 09/07/2012   HYPERLIPIDEMIA 02/18/2009   Qualifier: Diagnosis of  By: Jonny Ruiz MD, Len Blalock    HYPERTENSION 02/18/2009   Qualifier: Diagnosis of  By: Jonny Ruiz MD, Len Blalock    Past Surgical History:  Procedure Laterality Date   COLONOSCOPY  06/10/2009   right knee cartilage  12/26/1974   Patient Active Problem List   Diagnosis Date Noted   Acute stroke due to ischemia (HCC) 03/28/2023   Right foot drop 03/28/2023   CVA (cerebral vascular accident) (HCC) 03/19/2023   Vitamin D deficiency 07/31/2022   COPD (chronic obstructive pulmonary disease) (HCC) 07/29/2022   Pulmonary nodule, left 08/09/2021   Abnormal physical evaluation 07/15/2021   Screen for colon cancer 07/15/2021   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   Type II diabetes mellitus with manifestations (HCC) 07/15/2021   Hyperlipidemia LDL goal <70 07/15/2021   Tachycardia 07/15/2021   PVC (premature ventricular contraction) 07/15/2021   Insulin dependent type 2 diabetes mellitus (HCC) 07/15/2021   Lung nodule  07/15/2021   EKG abnormality 09/07/2012   Erectile dysfunction 09/07/2012   Snoring 09/07/2012   Encounter for well adult exam with abnormal findings 08/31/2012   Diverticulosis 08/31/2012   Essential hypertension 02/18/2009    ONSET DATE: 03/31/2023 (referral date); 03/20/2023 onset date  REFERRING DIAG: R29.898 (ICD-10-CM) - Right arm weakness  THERAPY DIAG:  Other lack of coordination  Muscle weakness (generalized)  Unsteadiness on feet  Right arm weakness  Stiffness of right shoulder, not elsewhere classified  Rationale for Evaluation and Treatment: Rehabilitation  SUBJECTIVE:   SUBJECTIVE STATEMENT:  Pt goes by "Ethan Boyd." Pt reports improvement in some areas with ADLs, but still difficulty with weakness.  Pt accompanied by: self Pt's wife, [name], reports fine motor, gripping, cutting food have been difficult. She also reports patient is competitive and likes to know if he is doing well typically on a letter grade system. Pt likes to understand the why behind treatment.  PERTINENT HISTORY: HTN, COPD, insulin-dependent T2DM, HLD MRI brain was revealing for acute infarct of left pons during hospitalization in March. Repeat MRI brain 03/29/23 - IMPRESSION: Interval evolution of the now subacute infarct in the left paramedian pons. No new acute infarct or intracranial hemorrhage.  PRECAUTIONS: Fall  WEIGHT BEARING RESTRICTIONS: No  PAIN:  Are you having pain? No  FALLS: Has patient fallen in last 6 months? No  LIVING ENVIRONMENT: Lives with: lives with their  spouse Lives in: House/apartment - Two level house with bedroom and bathroom downstairs Stairs: Yes: Internal: 7 steps to landing then 7 more steps; left hand rail for 7 steps, then handrail on both sides for second set and External: 4 steps; on left going up Has following equipment at home: Single point cane, Walker - 2 wheeled, Wheelchair (transport), and Grab bars Bathroom setup: upstairs bathroom walk-in shower  with small threshold with grab bars and built-in shower seat. Regular height commode  PLOF:  ADLs independent, IADLs independent for what patient was responsible for, driving, retired Chief Operating Officer), patient has two adult children ([name] and [name]) , enjoys golfing, ride around in truck to visit friends  PATIENT GOALS: Pt wants to be "back to where I was in February" with no limitations/weakness.   OBJECTIVE:   HAND DOMINANCE: Right, however patient using left hand recently  ADLs:  Eating: Difficulty cutting food, mild difficulty holding cups pending the weight (drinks from [facility] with straw to prevent spilling), slower to not spill Grooming: Brushing teeth with left mostly, slow on the right, not using q-tips currently UB Dressing: Difficulty with buttons, but otherwise independent LB Dressing: Independent Toileting: Independent, mostly using left hand for wiping Bathing: independent Tub Shower transfers: Independent Equipment: Grab bars and Walk in shower  IADLs: Shopping: Wife now doing grocery shopping where as pt would before. Concern for fatigue, balance, and overhead reaching Light housekeeping: Wife performing, but pt would share responsibility prior (goal to fold clothes) Meal Prep: Wife did most of the cooking before. Does not want to address Community mobility: Utilizing SPC 70% of the time. Pt does not use in the house, but if he leaves the house at all. Medication management: Independent, now have pop up tops.  Financial management: Independent Handwriting: 75% legible, improved gripping of pen as well per his report.  MOBILITY STATUS: Independent with SPC  POSTURE COMMENTS:  No Significant postural limitations Sitting balance:  Independent  ACTIVITY TOLERANCE: Activity tolerance: Pt reports he would force himself to get through every aisle of the grocery store, but it would be difficult.   FUNCTIONAL OUTCOME MEASURES: FOTO: Completed 4/23 score 61  UPPER  EXTREMITY ROM:    Active ROM Right eval Left eval  Shoulder flexion 100* WFL  Shoulder abduction 110* WFL  Shoulder adduction    Shoulder extension    Shoulder internal rotation Hip ht WFL  Shoulder external rotation [facility]  Elbow flexion [facility] WFL  Elbow extension [facility] WFL  Wrist flexion 40* WFL  Wrist extension [facility] WFL  Wrist ulnar deviation    Wrist radial deviation    Wrist pronation [facility] WFL  Wrist supination WFL WFL  (Blank rows = not tested)  Grasp slightly worse of right compared to left  UPPER EXTREMITY MMT:     MMT Right eval Left eval  Shoulder flexion 4-   Shoulder abduction    Shoulder adduction    Shoulder extension 4-   Shoulder internal rotation    Shoulder external rotation    Middle trapezius    Lower trapezius    Elbow flexion 4-   Elbow extension 4-   Wrist flexion    Wrist extension    Wrist ulnar deviation    Wrist radial deviation    Wrist pronation    Wrist supination    (Blank rows = not tested)  Grasp 3+ right hand  HAND FUNCTION: Grip strength: Right: 40.7 lbs; Left: 79.3 lbs and Lateral pinch: Right: 8 lbs, Left: 10  lbs -04/28/23 3-pt pinch: Right 6lbs  COORDINATION: 9 Hole Peg test: Right: 85 sec; Left: 29 sec Box and Blocks:  Right 45 blocks, Left 52 blocks - 04/28/23  SENSATION: WFL per patient report; however, may benefit from stereognosis  EDEMA: None  MUSCLE TONE: Appears to be WFL  COGNITION: Overall cognitive status: Within functional limits for tasks assessed  VISION: Subjective report: No vision changes noted Baseline vision: No visual deficits  VISION ASSESSMENT: Not tested  Patient has difficulty with following activities due to following visual impairments: None  PERCEPTION: WFL  PRAXIS: WFL  OBSERVATIONS: Pt able to walk from lobby area to table utilizing SPC. No loss of balance. Pt frequently utilizing left hand over right, although notes being right handed. Pt well kept and has supportive  spouse.  04/28/23: Pt ambulatory with SPC, no loss of balance, appears to hike hip a bit. Pt well-kept. Wife present in lobby, but did not join patient.  05/10/13: Similar appearance with ambulation SPC.   TODAY'S TREATMENT:                                                                                                                              DATE:   Neuromuscular reeducation - Pt engaging in RUE AAROM dowel HEP to address RUE weakness. Pt requiring verbal, visual, and tactile cues to proper form. OT providing hand over hand as needed specifically as right wrist during movements to ensure proper movement pattern. Pt performed 5-8 reps each x 1 set today while standing utilizing his single-point cane. Exercises listed below. Pt inquiring about use of weights. Education provided today importance of correct movement patterns as much as possible learned in clinic that can translate to home, then use of weights when appropriate in clinic to ensure correct movement. Pt receptive.  Exercises  - Shoulder scaption - AAROM with dowel 4x/week, 10-15 reps each x 2 sets -Standing shoulder extension - AAROM with dowel 4x/week, 10-15 reps each x 2 sets -Standing shoulder abduction - AAROM with dowel 4x/week, 10-15 reps each x 2 sets -Standing shoulder external rotation - AAROM with dowel 4x/week, 10-15 reps each x 2 sets -Standing shoulder internal rotation - AAROM with dowel 4x/week, 10-15 reps each x 2 sets  PATIENT EDUCATION: Education details: RUE HEP Person educated: Patient Education method: Explanation, Demonstration, Tactile cues, Verbal cues, and Handouts Education comprehension: verbalized understanding, returned demonstration, and needs further education  HOME EXERCISE PROGRAM: 04/28/23: Access Code: [email or web address] URL: [email or web address] Date: 04/28/2023 Prepared by: Darthula Desa  05/11/23: Access Code: [email or web address] URL: [email or web address]   GOALS: Goals  reviewed with patient? Yes  SHORT TERM GOALS: Target date: 05/16/2023   Pt will demonstrate improved ease with fastening buttons as evidenced by decreasing 3 button/unbutton time by TBD seconds. Baseline: Wife assists with polo shirts, need to address time it takes to complete Goal status: INITIAL  2.  Pt will demonstrate improved fine motor coordination for ADLs as evidenced by decreasing 9  hole peg test score for Rt hand by 10 secs. Baseline: Rt - 85 sec Goal status: INITIAL  3.  Pt will improve Rt grip strength by >/=10 lbs next visit as needed for daily activity. Baseline: Will test next visit 04/28/23: Rt 40.7 lbs Goal status: INITIAL  4.  Pt will improve Rt shoulder flexion AROM to 120* required for overhead reaching tasks. Baseline: Rt shoulder flexion 100* Goal status: INITIAL  5.  Pt will improve handwriting legibility to 100% in order to write checks. Baseline: 75% legible Goal status: INITIAL  6.  Pt will complete functional activity task in standing for up to 10 minutes without therapeutic rest break to simulate shopping tasks. Baseline: Unable currently for grocery shopping Goal status: INITIAL  LONG TERM GOALS: Target date: 06/13/2023  Pt will complete FOTO assessment at time of discharge scoring 73 or greater indicating functional progression with ADL and IADL completion. Baseline: 61 Goal status: INITIAL  2.  Pt will demonstrate improved fine motor coordination for ADLs as evidenced by decreasing 9 hole peg test score for Rt hand by 25 secs Baseline: Rt - 85 sec Goal status: INITIAL  3.  Pt will be able to place at least 50 blocks using Rt hand with completion of Box and Blocks test. Baseline: Will test next visit 04/28/23: Rt 45 blocks Goal status: INITIAL  4.  Pt will improve Rt shoulder flexion AROM to 140* required for overhead reaching tasks. Baseline: Rt shoulder flexion 100* Goal status: INITIAL  5.  Pt will be independent with HEP. Baseline:  Goal  status: INITIAL  6.  Pt will fold and put away basket of clothes independently. Baseline: Unable currently Goal status: INITIAL  ASSESSMENT:  CLINICAL IMPRESSION: Pt continues to make steady progress towards recovery following CVA. Pt would continue to benefit from skilled OT services to address performance deficits, and maximize functional use RUE.  PERFORMANCE DEFICITS: in functional skills including ADLs, IADLs, coordination, dexterity, sensation, ROM, strength, Fine motor control, Gross motor control, mobility, balance, body mechanics, endurance, and UE functional use, and psychosocial skills including interpersonal interactions and routines and behaviors.   IMPAIRMENTS: are limiting patient from ADLs, IADLs, leisure, social participation, and driving .   CO-MORBIDITIES: may have co-morbidities  that affects occupational performance. Patient will benefit from skilled OT to address above impairments and improve overall function.  MODIFICATION OR ASSISTANCE TO COMPLETE EVALUATION: No modification of tasks or assist necessary to complete an evaluation.  OT OCCUPATIONAL PROFILE AND HISTORY: Detailed assessment: Review of records and additional review of physical, cognitive, psychosocial history related to current functional performance.  CLINICAL DECISION MAKING: Moderate - several treatment options, min-mod task modification necessary  REHAB POTENTIAL: Good  EVALUATION COMPLEXITY: Low    PLAN:  OT FREQUENCY: 2x/week  OT DURATION: 8 weeks  PLANNED INTERVENTIONS: self care/ADL training, therapeutic exercise, therapeutic activity, neuromuscular re-education, passive range of motion, gait training, balance training, functional mobility training, electrical stimulation, ultrasound, moist heat, cryotherapy, patient/family education, cognitive remediation/compensation, visual/perceptual remediation/compensation, psychosocial skills training, energy conservation, coping strategies  training, DME and/or AE instructions, and Re-evaluation  RECOMMENDED OTHER SERVICES: None  CONSULTED AND AGREED WITH PLAN OF CARE: Patient and family member/caregiver  PLAN FOR NEXT SESSION: review RUE HEP dowel, coordination exercises, inhibitory techniques to forearm to challenge hand/digits, gripper with blocks or clothes pin, scapula HEP   Shahad Mazurek M Topher [place], OT 05/11/2023, 11:16 AM

## 2023-05-12 ENCOUNTER — Ambulatory Visit: Payer: Medicare Other

## 2023-05-12 ENCOUNTER — Telehealth: Payer: Self-pay | Admitting: Internal Medicine

## 2023-05-12 DIAGNOSIS — R2681 Unsteadiness on feet: Secondary | ICD-10-CM

## 2023-05-12 DIAGNOSIS — R262 Difficulty in walking, not elsewhere classified: Secondary | ICD-10-CM

## 2023-05-12 DIAGNOSIS — R278 Other lack of coordination: Secondary | ICD-10-CM

## 2023-05-12 DIAGNOSIS — R29898 Other symptoms and signs involving the musculoskeletal system: Secondary | ICD-10-CM

## 2023-05-12 DIAGNOSIS — R2689 Other abnormalities of gait and mobility: Secondary | ICD-10-CM

## 2023-05-12 DIAGNOSIS — M25611 Stiffness of right shoulder, not elsewhere classified: Secondary | ICD-10-CM

## 2023-05-12 DIAGNOSIS — M6281 Muscle weakness (generalized): Secondary | ICD-10-CM | POA: Diagnosis not present

## 2023-05-12 DIAGNOSIS — E118 Type 2 diabetes mellitus with unspecified complications: Secondary | ICD-10-CM

## 2023-05-12 NOTE — Therapy (Signed)
OUTPATIENT OCCUPATIONAL THERAPY NEURO TREATMENT  Patient Name: Ethan Boyd MRN: [id number] DOB:1956-12-16, 67 y.o., male Today's Date: 05/12/2023  PCP: Corwin Levins, MD REFERRING PROVIDER: Corwin Levins, MD  END OF SESSION:  OT End of Session - 05/12/23 1311     Visit Number 4    Number of Visits 16    Authorization Type Medicare A&B / BCBS    OT Start Time 1313    OT Stop Time 1358    OT Time Calculation (min) 45 min    Equipment Utilized During Treatment single point cane    Activity Tolerance Patient tolerated treatment well    Behavior During Therapy [facility] for tasks assessed/performed             Past Medical History:  Diagnosis Date   DIABETES MELLITUS, TYPE II 02/18/2009   Qualifier: Diagnosis of  By: Jonny Ruiz MD, Len Blalock    Diverticulosis 08/31/2012   Colonoscopy, June 10, 2009, Dr Perry/GI   ERECTILE DYSFUNCTION 02/18/2009   Qualifier: Diagnosis of  By: Jonny Ruiz MD, Len Blalock    Erectile dysfunction 09/07/2012   HYPERLIPIDEMIA 02/18/2009   Qualifier: Diagnosis of  By: Jonny Ruiz MD, Len Blalock    HYPERTENSION 02/18/2009   Qualifier: Diagnosis of  By: Jonny Ruiz MD, Len Blalock    Past Surgical History:  Procedure Laterality Date   COLONOSCOPY  06/10/2009   right knee cartilage  12/26/1974   Patient Active Problem List   Diagnosis Date Noted   Acute stroke due to ischemia (HCC) 03/28/2023   Right foot drop 03/28/2023   CVA (cerebral vascular accident) (HCC) 03/19/2023   Vitamin D deficiency 07/31/2022   COPD (chronic obstructive pulmonary disease) (HCC) 07/29/2022   Pulmonary nodule, left 08/09/2021   Abnormal physical evaluation 07/15/2021   Screen for colon cancer 07/15/2021   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   Type II diabetes mellitus with manifestations (HCC) 07/15/2021   Hyperlipidemia LDL goal <70 07/15/2021   Tachycardia 07/15/2021   PVC (premature ventricular contraction) 07/15/2021   Insulin dependent type 2 diabetes mellitus (HCC) 07/15/2021   Lung nodule  07/15/2021   EKG abnormality 09/07/2012   Erectile dysfunction 09/07/2012   Snoring 09/07/2012   Encounter for well adult exam with abnormal findings 08/31/2012   Diverticulosis 08/31/2012   Essential hypertension 02/18/2009    ONSET DATE: 03/31/2023 (referral date); 03/20/2023 onset date  REFERRING DIAG: R29.898 (ICD-10-CM) - Right arm weakness  THERAPY DIAG:  Other lack of coordination  Muscle weakness (generalized)  Unsteadiness on feet  Right arm weakness  Stiffness of right shoulder, not elsewhere classified  Rationale for Evaluation and Treatment: Rehabilitation  SUBJECTIVE:   SUBJECTIVE STATEMENT:  Pt goes by "[name]." Pt without complaint today.  Pt accompanied by: self Pt's wife, [name], reports fine motor, gripping, cutting food have been difficult. She also reports patient is competitive and likes to know if he is doing well typically on a letter grade system. Pt likes to understand the why behind treatment.  PERTINENT HISTORY: HTN, COPD, insulin-dependent T2DM, HLD MRI brain was revealing for acute infarct of left pons during hospitalization in March. Repeat MRI brain 03/29/23 - IMPRESSION: Interval evolution of the now subacute infarct in the left paramedian pons. No new acute infarct or intracranial hemorrhage.  PRECAUTIONS: Fall  WEIGHT BEARING RESTRICTIONS: No  PAIN:  Are you having pain? No  FALLS: Has patient fallen in last 6 months? No  LIVING ENVIRONMENT: Lives with: lives with their spouse Lives in: House/apartment - Two level house with  bedroom and bathroom downstairs Stairs: Yes: Internal: 7 steps to landing then 7 more steps; left hand rail for 7 steps, then handrail on both sides for second set and External: 4 steps; on left going up Has following equipment at home: Single point cane, Walker - 2 wheeled, Wheelchair (transport), and Grab bars Bathroom setup: upstairs bathroom walk-in shower with small threshold with grab bars and built-in shower  seat. Regular height commode  PLOF:  ADLs independent, IADLs independent for what patient was responsible for, driving, retired Chief Operating Officer), patient has two adult children ([name] and [name]) , enjoys golfing, ride around in truck to visit friends  PATIENT GOALS: Pt wants to be "back to where I was in February" with no limitations/weakness.   OBJECTIVE:   HAND DOMINANCE: Right, however patient using left hand recently  ADLs:  Eating: Difficulty cutting food, mild difficulty holding cups pending the weight (drinks from [facility] with straw to prevent spilling), slower to not spill Grooming: Brushing teeth with left mostly, slow on the right, not using q-tips currently UB Dressing: Difficulty with buttons, but otherwise independent LB Dressing: Independent Toileting: Independent, mostly using left hand for wiping Bathing: independent Tub Shower transfers: Independent Equipment: Grab bars and Walk in shower  IADLs: Shopping: Wife now doing grocery shopping where as pt would before. Concern for fatigue, balance, and overhead reaching Light housekeeping: Wife performing, but pt would share responsibility prior (goal to fold clothes) Meal Prep: Wife did most of the cooking before. Does not want to address Community mobility: Utilizing SPC 70% of the time. Pt does not use in the house, but if he leaves the house at all. Medication management: Independent, now have pop up tops.  Financial management: Independent Handwriting: 75% legible, improved gripping of pen as well per his report.  MOBILITY STATUS: Independent with SPC  POSTURE COMMENTS:  No Significant postural limitations Sitting balance:  Independent  ACTIVITY TOLERANCE: Activity tolerance: Pt reports he would force himself to get through every aisle of the grocery store, but it would be difficult.   FUNCTIONAL OUTCOME MEASURES: FOTO: Completed 4/23 score 61  UPPER EXTREMITY ROM:    Active ROM Right eval Left eval   Shoulder flexion 100* WFL  Shoulder abduction 110* WFL  Shoulder adduction    Shoulder extension    Shoulder internal rotation Hip ht [facility]  Shoulder external rotation [facility]  Elbow flexion [facility] WFL  Elbow extension [facility] WFL  Wrist flexion 40* WFL  Wrist extension [facility] WFL  Wrist ulnar deviation    Wrist radial deviation    Wrist pronation [facility] WFL  Wrist supination WFL WFL  (Blank rows = not tested)  Grasp slightly worse of right compared to left  UPPER EXTREMITY MMT:     MMT Right eval Left eval  Shoulder flexion 4-   Shoulder abduction    Shoulder adduction    Shoulder extension 4-   Shoulder internal rotation    Shoulder external rotation    Middle trapezius    Lower trapezius    Elbow flexion 4-   Elbow extension 4-   Wrist flexion    Wrist extension    Wrist ulnar deviation    Wrist radial deviation    Wrist pronation    Wrist supination    (Blank rows = not tested)  Grasp 3+ right hand  HAND FUNCTION: Grip strength: Right: 40.7 lbs; Left: 79.3 lbs and Lateral pinch: Right: 8 lbs, Left: 10 lbs -04/28/23 3-pt pinch: Right 6lbs  COORDINATION: 9  Hole Peg test: Right: 85 sec; Left: 29 sec Box and Blocks:  Right 45 blocks, Left 52 blocks - 04/28/23  SENSATION: WFL per patient report; however, may benefit from stereognosis  EDEMA: None  MUSCLE TONE: Appears to be WFL  COGNITION: Overall cognitive status: Within functional limits for tasks assessed  VISION: Subjective report: No vision changes noted Baseline vision: No visual deficits  VISION ASSESSMENT: Not tested  Patient has difficulty with following activities due to following visual impairments: None  PERCEPTION: WFL  PRAXIS: WFL  OBSERVATIONS: Pt able to walk from lobby area to table utilizing SPC. No loss of balance. Pt frequently utilizing left hand over right, although notes being right handed. Pt well kept and has supportive spouse.  04/28/23: Pt ambulatory with SPC, no loss of balance,  appears to hike hip a bit. Pt well-kept. Wife present in lobby, but did not join patient.  05/10/13: Similar appearance with ambulation SPC.   TODAY'S TREATMENT:                                                                                                                              DATE:   Neuromuscular re-education: Pt engaging in seated tabletop activity utilizing right gross grasp to pick up colored blocks and place into bowl with 15 pound hand grip for strengthening and muscle endurance of affected extremity. Pt performed two trials with verbal cues for concentration and re-adjustment of grasp as needed. Pt then performing standing activity incorporating D2 Flexion and D1 extension patterns to pick up large pegs with RUE and place on peg board placed higher on vertical service, then removing to place back into bowl. Pt requiring supervision for safety in standing, and intermittent tactile cues to RUE for correct movement.  PATIENT EDUCATION: Education details: Continued use of HEP Person educated: Patient Education method: Explanation, Demonstration, Tactile cues, and Verbal cues Education comprehension: verbalized understanding, returned demonstration, and needs further education  HOME EXERCISE PROGRAM: 04/28/23: Access Code: [email or web address] URL: [email or web address] Date: 04/28/2023 Prepared by: Nelson Julson  05/11/23: Access Code: [email or web address] URL: [email or web address]  05/12/23: None   GOALS: Goals reviewed with patient? Yes  SHORT TERM GOALS: Target date: 05/16/2023   Pt will demonstrate improved ease with fastening buttons as evidenced by decreasing 3 button/unbutton time by TBD seconds. Baseline: Wife assists with polo shirts, need to address time it takes to complete Goal status: INITIAL  2.  Pt will demonstrate improved fine motor coordination for ADLs as evidenced by decreasing 9 hole peg test score for Rt hand by 10 secs. Baseline: Rt - 85  sec Goal status: INITIAL  3.  Pt will improve Rt grip strength by >/=10 lbs next visit as needed for daily activity. Baseline: Will test next visit 04/28/23: Rt 40.7 lbs Goal status: INITIAL  4.  Pt will improve Rt shoulder flexion AROM to 120* required for overhead reaching tasks. Baseline: Rt shoulder flexion 100* Goal status: INITIAL  5.  Pt will  improve handwriting legibility to 100% in order to write checks. Baseline: 75% legible Goal status: INITIAL  6.  Pt will complete functional activity task in standing for up to 10 minutes without therapeutic rest break to simulate shopping tasks. Baseline: Unable currently for grocery shopping Goal status: INITIAL  LONG TERM GOALS: Target date: 06/13/2023  Pt will complete FOTO assessment at time of discharge scoring 73 or greater indicating functional progression with ADL and IADL completion. Baseline: 61 Goal status: INITIAL  2.  Pt will demonstrate improved fine motor coordination for ADLs as evidenced by decreasing 9 hole peg test score for Rt hand by 25 secs Baseline: Rt - 85 sec Goal status: INITIAL  3.  Pt will be able to place at least 50 blocks using Rt hand with completion of Box and Blocks test. Baseline: Will test next visit 04/28/23: Rt 45 blocks Goal status: INITIAL  4.  Pt will improve Rt shoulder flexion AROM to 140* required for overhead reaching tasks. Baseline: Rt shoulder flexion 100* Goal status: INITIAL  5.  Pt will be independent with HEP. Baseline:  Goal status: INITIAL  6.  Pt will fold and put away basket of clothes independently. Baseline: Unable currently Goal status: INITIAL  ASSESSMENT:  CLINICAL IMPRESSION: Pt reports no changes from yesterday's OT visit. Pt performing seated and standing activities today with neuromuscular re-education involvement to RUE. Pt continues to make steady progress. Pt would continue to benefit from skilled OT services to maximize independence in daily  activities.  PERFORMANCE DEFICITS: in functional skills including ADLs, IADLs, coordination, dexterity, sensation, ROM, strength, Fine motor control, Gross motor control, mobility, balance, body mechanics, endurance, and UE functional use, and psychosocial skills including interpersonal interactions and routines and behaviors.   IMPAIRMENTS: are limiting patient from ADLs, IADLs, leisure, social participation, and driving .   CO-MORBIDITIES: may have co-morbidities  that affects occupational performance. Patient will benefit from skilled OT to address above impairments and improve overall function.  MODIFICATION OR ASSISTANCE TO COMPLETE EVALUATION: No modification of tasks or assist necessary to complete an evaluation.  OT OCCUPATIONAL PROFILE AND HISTORY: Detailed assessment: Review of records and additional review of physical, cognitive, psychosocial history related to current functional performance.  CLINICAL DECISION MAKING: Moderate - several treatment options, min-mod task modification necessary  REHAB POTENTIAL: Good  EVALUATION COMPLEXITY: Low    PLAN:  OT FREQUENCY: 2x/week  OT DURATION: 8 weeks  PLANNED INTERVENTIONS: self care/ADL training, therapeutic exercise, therapeutic activity, neuromuscular re-education, passive range of motion, gait training, balance training, functional mobility training, electrical stimulation, ultrasound, moist heat, cryotherapy, patient/family education, cognitive remediation/compensation, visual/perceptual remediation/compensation, psychosocial skills training, energy conservation, coping strategies training, DME and/or AE instructions, and Re-evaluation  RECOMMENDED OTHER SERVICES: None  CONSULTED AND AGREED WITH PLAN OF CARE: Patient and family member/caregiver  PLAN FOR NEXT SESSION: review RUE HEP dowel from 5/16, coordination exercises, inhibitory techniques to forearm to challenge hand/digits, weighted clothes pin, scapula HEP, consider  stirring of materials (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

## 2023-05-12 NOTE — Telephone Encounter (Signed)
Pt had a referral to Endo last year but he was unable to get an appointment due to the offices availability.   Pt is requesting that we re-open his endo referral and send him to either [facility] or to [facility] for his type II diabetes

## 2023-05-12 NOTE — Therapy (Signed)
OUTPATIENT PHYSICAL THERAPY NEURO TREATMENT   Patient Name: Ethan Boyd MRN: [id number] DOB:1956/05/09, 67 y.o., male Today's Date: 05/12/2023   PCP: Oliver Barre, MD REFERRING PROVIDER: Oliver Barre, MD   END OF SESSION:  PT End of Session - 05/12/23 1309     Visit Number 13    Number of Visits 17    Date for PT Re-Evaluation 06/09/23    Authorization Type BCBS    PT Start Time 1403    PT Stop Time 1445    PT Time Calculation (min) 42 min    Activity Tolerance Patient tolerated treatment well    Behavior During Therapy [facility] for tasks assessed/performed              Past Medical History:  Diagnosis Date   DIABETES MELLITUS, TYPE II 02/18/2009   Qualifier: Diagnosis of  By: Jonny Ruiz MD, Len Blalock    Diverticulosis 08/31/2012   Colonoscopy, June 10, 2009, Dr Perry/GI   ERECTILE DYSFUNCTION 02/18/2009   Qualifier: Diagnosis of  By: Jonny Ruiz MD, Len Blalock    Erectile dysfunction 09/07/2012   HYPERLIPIDEMIA 02/18/2009   Qualifier: Diagnosis of  By: Jonny Ruiz MD, Len Blalock    HYPERTENSION 02/18/2009   Qualifier: Diagnosis of  By: Jonny Ruiz MD, Len Blalock    Past Surgical History:  Procedure Laterality Date   COLONOSCOPY  06/10/2009   right knee cartilage  12/26/1974   Patient Active Problem List   Diagnosis Date Noted   Acute stroke due to ischemia (HCC) 03/28/2023   Right foot drop 03/28/2023   CVA (cerebral vascular accident) (HCC) 03/19/2023   Vitamin D deficiency 07/31/2022   COPD (chronic obstructive pulmonary disease) (HCC) 07/29/2022   Pulmonary nodule, left 08/09/2021   Abnormal physical evaluation 07/15/2021   Screen for colon cancer 07/15/2021   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   Type II diabetes mellitus with manifestations (HCC) 07/15/2021   Hyperlipidemia LDL goal <70 07/15/2021   Tachycardia 07/15/2021   PVC (premature ventricular contraction) 07/15/2021   Insulin dependent type 2 diabetes mellitus (HCC) 07/15/2021   Lung nodule 07/15/2021   EKG abnormality 09/07/2012    Erectile dysfunction 09/07/2012   Snoring 09/07/2012   Encounter for well adult exam with abnormal findings 08/31/2012   Diverticulosis 08/31/2012   Essential hypertension 02/18/2009    ONSET DATE: 03/19/23  REFERRING DIAG:  I63.9 (ICD-10-CM) - Acute stroke due to ischemia  M21.371 (ICD-10-CM) - Right foot drop    THERAPY DIAG:  Other lack of coordination  Muscle weakness (generalized)  Unsteadiness on feet  Difficulty in walking, not elsewhere classified  Other abnormalities of gait and mobility  Rationale for Evaluation and Treatment: Rehabilitation  SUBJECTIVE:  SUBJECTIVE STATEMENT: Patient reports doing well. Has not spoken to wife re: AFO yet, but states he will in the next week or so. Denies falls/near falls.   Pt accompanied by: self (driving himself to PT now)  PERTINENT HISTORY: HTN, COPD, insulin-dependent T2DM, HLD   PAIN:  Are you having pain? No  PRECAUTIONS: Fall  PATIENT GOALS: "walk normal"   OBJECTIVE:   DIAGNOSTIC FINDINGS: 03/29/23 brain MRI:  IMPRESSION: Interval evolution of the now subacute infarct in the left paramedian pons. No new acute infarct or intracranial hemorrhage.  TODAY'S TREATMENT:                       GAIT: -extensive conversation on AFO vs foot up brace and possible need for toe cap  -demonstrated slightly improved gait pattern with use of foot up brace, but still allowing for some degree of freedom at ankle   NMR:  -intrinsic foot strengthening with towel scrunches, ball manipulation and AROM ankle coordination on floor pillow     PATIENT EDUCATION: Education details: continue HEP, AFO vs foot up brace Person educated: Patient Education method: Medical illustrator Education comprehension: verbalized understanding and  needs further education  HOME EXERCISE PROGRAM: Access Code: [email or web address] URL: [email or web address] Date: 04/03/2023 Prepared by: Peter Congo  Exercises - Alternating Step Taps with Counter Support  - 1 x daily - 7 x weekly - 3 sets - 10 reps - Staggered Sit-to-Stand  - 1 x daily - 7 x weekly - 3 sets - 10 reps -Left arm supported on hand rail at stairs: lift your R foot onto the 1st step tapping your heel onto the step   -think "up and on" then "up and back" -lifting R leg up and over dumbbell or can of soup from a seated position   -emphasis on up and over and not around - Modified [employer]  - 1 x daily - 7 x weekly - 3 sets - 30-45s hold -standing lifting R toes off the floor  - Standing Knee Flexion AROM with Chair Support  - 1 x daily - 7 x weekly - 3 sets - 10 reps   GOALS: Goals reviewed with patient? Yes  SHORT TERM GOALS: Target date: 04/28/23  Pt will be independent with initial HEP for improved gait mechanics  Baseline: to be provided Goal status: MET  2.  Pt will improve gait speed to >/= .79m/s to demonstrate improved community ambulation  Baseline: .4m/s, 1.10m/s (5/3) Goal status: MET  3.  Pt will improve 5x STS to </= 13 sec to demo improved functional LE strength and balance   Baseline: 15.22s B UE, 11.56 sec no UE (5/3) Goal status: MET  4.  Pt will improve Berg score to 42/56 for decreased fall risk   Baseline: 38/56 (4/8), 48/56 (5/3) Goal status: MET   LONG TERM GOALS: Target date: 06/09/23  Pt will be independent with final HEP for improved gait mechanics  Baseline: to be provided  Goal status: INITIAL  2.  Pt will improve gait speed to >/= 1.1 m/s to demonstrate improved community ambulation  Baseline: .20m/s, 1.26m/s (5/3) Goal status: REVISED  3.  Pt will improve 5x STS to </= 10 sec to demo improved functional LE strength and balance   Baseline: 15.22s B UE, 11.56 sec no UE (5/3) Goal status: INITIAL  4.  Pt will  improve Berg score to 45/56 for decreased fall risk  Baseline: 38/56 (4/8), 48/56 (5/3) Goal status: MET  5.  Patient will improve FOTO score to >/= 69 to demonstrate functional improvement Baseline: 52, 68.74 (5/3) Goal status: INITIAL  ASSESSMENT:  CLINICAL IMPRESSION: Patient seen for skilled PT session with emphasis on continuing AFO conversation and progressing NMR. Patient agreeable to trial foot up brace to assist with R foot drop and minimizing impact of extensor tone. Exercises provided to further develop intrinsic foot strength to further assist with balance. Continue POC.    OBJECTIVE IMPAIRMENTS: Abnormal gait, decreased balance, decreased coordination, decreased endurance, decreased knowledge of condition, decreased knowledge of use of DME, decreased strength, impaired tone, and impaired UE functional use.   ACTIVITY LIMITATIONS: carrying, lifting, standing, squatting, stairs, transfers, locomotion level, and caring for others  PARTICIPATION LIMITATIONS: meal prep, cleaning, interpersonal relationship, driving, shopping, community activity, occupation, and yard work  PERSONAL FACTORS: Age, Fitness, Past/current experiences, Time since onset of injury/illness/exacerbation, Transportation, and 3+ comorbidities: see above  are also affecting patient's functional outcome.   REHAB POTENTIAL: Good  CLINICAL DECISION MAKING: Stable/uncomplicated  EVALUATION COMPLEXITY: Low  PLAN:  PT FREQUENCY: 2x/week  PT DURATION: 8 weeks  PLANNED INTERVENTIONS: Therapeutic exercises, Therapeutic activity, Neuromuscular re-education, Balance training, Gait training, Patient/Family education, Self Care, Joint mobilization, Stair training, Vestibular training, Canalith repositioning, Visual/preceptual remediation/compensation, Orthotic/Fit training, DME instructions, Aquatic Therapy, Dry Needling, Electrical stimulation, Taping, Manual therapy, and Re-evaluation  PLAN FOR NEXT SESSION: add  to HEP, gait with SPC, minimizing compensatory strategies, use of AFO? Quadruped, anything moving/strengthening outside of R LE extensor tone, resisted gait?, ankle strategy, stepping strategy, BOSU ball toss, pt to talk with wife about if he wants to pursue getting an AFO   Westley Foots, PT, DPT, CBIS 05/12/2023, 4:02 PM

## 2023-05-15 NOTE — Telephone Encounter (Signed)
Ok done

## 2023-05-15 NOTE — Telephone Encounter (Signed)
Ok this is done 

## 2023-05-16 ENCOUNTER — Ambulatory Visit: Payer: Medicare Other

## 2023-05-16 ENCOUNTER — Ambulatory Visit: Payer: Medicare Other | Admitting: Physical Therapy

## 2023-05-16 DIAGNOSIS — M25611 Stiffness of right shoulder, not elsewhere classified: Secondary | ICD-10-CM

## 2023-05-16 DIAGNOSIS — R29898 Other symptoms and signs involving the musculoskeletal system: Secondary | ICD-10-CM

## 2023-05-16 DIAGNOSIS — R2689 Other abnormalities of gait and mobility: Secondary | ICD-10-CM

## 2023-05-16 DIAGNOSIS — M6281 Muscle weakness (generalized): Secondary | ICD-10-CM

## 2023-05-16 DIAGNOSIS — R2681 Unsteadiness on feet: Secondary | ICD-10-CM

## 2023-05-16 DIAGNOSIS — R278 Other lack of coordination: Secondary | ICD-10-CM

## 2023-05-16 DIAGNOSIS — R262 Difficulty in walking, not elsewhere classified: Secondary | ICD-10-CM

## 2023-05-16 NOTE — Therapy (Signed)
OUTPATIENT PHYSICAL THERAPY NEURO TREATMENT   Patient Name: Ethan Boyd MRN: [id number] DOB:February 06, 1956, 67 y.o., male Today's Date: 05/16/2023   PCP: Oliver Barre, MD REFERRING PROVIDER: Oliver Barre, MD   END OF SESSION:  PT End of Session - 05/16/23 1401     Visit Number 14    Number of Visits 17    Date for PT Re-Evaluation 06/09/23    Authorization Type BCBS    PT Start Time 1400    PT Stop Time 1447    PT Time Calculation (min) 47 min    Equipment Utilized During Treatment Gait belt    Activity Tolerance Patient tolerated treatment well    Behavior During Therapy WFL for tasks assessed/performed               Past Medical History:  Diagnosis Date   DIABETES MELLITUS, TYPE II 02/18/2009   Qualifier: Diagnosis of  By: Jonny Ruiz MD, Len Blalock    Diverticulosis 08/31/2012   Colonoscopy, June 10, 2009, Dr Perry/GI   ERECTILE DYSFUNCTION 02/18/2009   Qualifier: Diagnosis of  By: Jonny Ruiz MD, Len Blalock    Erectile dysfunction 09/07/2012   HYPERLIPIDEMIA 02/18/2009   Qualifier: Diagnosis of  By: Jonny Ruiz MD, Len Blalock    HYPERTENSION 02/18/2009   Qualifier: Diagnosis of  By: Jonny Ruiz MD, Len Blalock    Past Surgical History:  Procedure Laterality Date   COLONOSCOPY  06/10/2009   right knee cartilage  12/26/1974   Patient Active Problem List   Diagnosis Date Noted   Acute stroke due to ischemia (HCC) 03/28/2023   Right foot drop 03/28/2023   CVA (cerebral vascular accident) (HCC) 03/19/2023   Vitamin D deficiency 07/31/2022   COPD (chronic obstructive pulmonary disease) (HCC) 07/29/2022   Pulmonary nodule, left 08/09/2021   Abnormal physical evaluation 07/15/2021   Screen for colon cancer 07/15/2021   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   Type II diabetes mellitus with manifestations (HCC) 07/15/2021   Hyperlipidemia LDL goal <70 07/15/2021   Tachycardia 07/15/2021   PVC (premature ventricular contraction) 07/15/2021   Insulin dependent type 2 diabetes mellitus (HCC) 07/15/2021    Lung nodule 07/15/2021   EKG abnormality 09/07/2012   Erectile dysfunction 09/07/2012   Snoring 09/07/2012   Encounter for well adult exam with abnormal findings 08/31/2012   Diverticulosis 08/31/2012   Essential hypertension 02/18/2009    ONSET DATE: 03/19/23  REFERRING DIAG:  I63.9 (ICD-10-CM) - Acute stroke due to ischemia  M21.371 (ICD-10-CM) - Right foot drop    THERAPY DIAG:  Muscle weakness (generalized)  Unsteadiness on feet  Difficulty in walking, not elsewhere classified  Other abnormalities of gait and mobility  Rationale for Evaluation and Treatment: Rehabilitation  SUBJECTIVE:  SUBJECTIVE STATEMENT: Pt reports he is doing well today, no falls or acute changes since last visit. No pain today. Pt reports he plans to order foot-up brace. Pt has noticed his R foot catching when walking on the grass, wondering about how to get back to mowing his yard safely (has a self-propelling push mower).  Pt accompanied by: self (driving himself to PT now)  PERTINENT HISTORY: HTN, COPD, insulin-dependent T2DM, HLD   PAIN:  Are you having pain? No  PRECAUTIONS: Fall  PATIENT GOALS: "walk normal"   OBJECTIVE:   DIAGNOSTIC FINDINGS: 03/29/23 brain MRI:  IMPRESSION: Interval evolution of the now subacute infarct in the left paramedian pons. No new acute infarct or intracranial hemorrhage.  TODAY'S TREATMENT:                       TherAct: Recommend that pt trial walking on grass at home with a foot-up brace first, if that goes well can try pushing his mower with the mower turned off with family assist.  NMR: Standing balance on flat side of Bosu in // bars with no UE support and CGA: Static balance 3 x 30 sec each Ball toss x 50 reps with focus on reaching outside BOS and on balance  recovery  In // bars to work on dynamic gait and balance: Blaze pods random taps (6 pods) over uneven blue mat and stepping over foam beams Pt scores 9 taps (BUE support), 10 taps (no UE support), and 7 taps (no UE support) Pt scores 10, 8, 8 taps (no UE support) Blaze pods random taps (5 pods) while standing on blue mat with no UE support: RLE only, LLE only, alt L/R LE Pt scores 38 taps, 24 taps, 20 taps  PATIENT EDUCATION: Education details: continue HEP, educated pt that therapy recommends toe cap along with foot up brace as he does not want to get an AFO right now, recommend trialing gait over grass at home with foot-up brace Person educated: Patient Education method: Explanation and Demonstration Education comprehension: verbalized understanding and needs further education  HOME EXERCISE PROGRAM: Access Code: [email or web address] URL: [email or web address] Date: 04/03/2023 Prepared by: Peter Congo  Exercises - Alternating Step Taps with Counter Support  - 1 x daily - 7 x weekly - 3 sets - 10 reps - Staggered Sit-to-Stand  - 1 x daily - 7 x weekly - 3 sets - 10 reps -Left arm supported on hand rail at stairs: lift your R foot onto the 1st step tapping your heel onto the step   -think "up and on" then "up and back" -lifting R leg up and over dumbbell or can of soup from a seated position   -emphasis on up and over and not around - Modified [employer]  - 1 x daily - 7 x weekly - 3 sets - 30-45s hold -standing lifting R toes off the floor  - Standing Knee Flexion AROM with Chair Support  - 1 x daily - 7 x weekly - 3 sets - 10 reps   GOALS: Goals reviewed with patient? Yes  SHORT TERM GOALS: Target date: 04/28/23  Pt will be independent with initial HEP for improved gait mechanics  Baseline: to be provided Goal status: MET  2.  Pt will improve gait speed to >/= .47m/s to demonstrate improved community ambulation  Baseline: .93m/s, 1.16m/s (5/3) Goal status:  MET  3.  Pt will improve 5x STS to </= 13 sec to demo improved functional LE strength  and balance   Baseline: 15.22s B UE, 11.56 sec no UE (5/3) Goal status: MET  4.  Pt will improve Berg score to 42/56 for decreased fall risk   Baseline: 38/56 (4/8), 48/56 (5/3) Goal status: MET   LONG TERM GOALS: Target date: 06/09/23  Pt will be independent with final HEP for improved gait mechanics  Baseline: to be provided  Goal status: INITIAL  2.  Pt will improve gait speed to >/= 1.1 m/s to demonstrate improved community ambulation  Baseline: .65m/s, 1.74m/s (5/3) Goal status: REVISED  3.  Pt will improve 5x STS to </= 10 sec to demo improved functional LE strength and balance   Baseline: 15.22s B UE, 11.56 sec no UE (5/3) Goal status: INITIAL  4.  Pt will improve Berg score to 45/56 for decreased fall risk  Baseline: 38/56 (4/8), 48/56 (5/3) Goal status: MET  5.  Patient will improve FOTO score to >/= 69 to demonstrate functional improvement Baseline: 52, 68.74 (5/3) Goal status: INITIAL  ASSESSMENT:  CLINICAL IMPRESSION: Emphasis of skilled PT session on working on dynamic gait, balance strategies, and RLE NMR. Pt does fatigue quickly with Blaze pod task this date and exhibits impaired SLS stability when standing on RLE on unlevel surface. Pt continues to benefit from skilled therapy services to work towards improving his RLE control to improve his balance and gait mechanics in order to decrease his fall risk. Pt also benefits from ongoing education regarding most appropriate bracing to improve his RLE ankle DF to improve limb clearance during gait. Continue POC.    OBJECTIVE IMPAIRMENTS: Abnormal gait, decreased balance, decreased coordination, decreased endurance, decreased knowledge of condition, decreased knowledge of use of DME, decreased strength, impaired tone, and impaired UE functional use.   ACTIVITY LIMITATIONS: carrying, lifting, standing, squatting, stairs,  transfers, locomotion level, and caring for others  PARTICIPATION LIMITATIONS: meal prep, cleaning, interpersonal relationship, driving, shopping, community activity, occupation, and yard work  PERSONAL FACTORS: Age, Fitness, Past/current experiences, Time since onset of injury/illness/exacerbation, Transportation, and 3+ comorbidities: see above  are also affecting patient's functional outcome.   REHAB POTENTIAL: Good  CLINICAL DECISION MAKING: Stable/uncomplicated  EVALUATION COMPLEXITY: Low  PLAN:  PT FREQUENCY: 2x/week  PT DURATION: 8 weeks  PLANNED INTERVENTIONS: Therapeutic exercises, Therapeutic activity, Neuromuscular re-education, Balance training, Gait training, Patient/Family education, Self Care, Joint mobilization, Stair training, Vestibular training, Canalith repositioning, Visual/preceptual remediation/compensation, Orthotic/Fit training, DME instructions, Aquatic Therapy, Dry Needling, Electrical stimulation, Taping, Manual therapy, and Re-evaluation  PLAN FOR NEXT SESSION: add to HEP, gait with SPC, minimizing compensatory strategies, Quadruped, anything moving/strengthening outside of R LE extensor tone, resisted gait?, ankle strategy, stepping strategy, BOSU ball toss, did pt order foot up brace?   Peter Congo, PT, DPT, CSRS 05/16/2023, 2:47 PM

## 2023-05-16 NOTE — Therapy (Signed)
OUTPATIENT OCCUPATIONAL THERAPY NEURO TREATMENT  Patient Name: Ethan Boyd MRN: [id number] DOB:[birth year], 67 y.o., male Today's Date: 05/16/2023  PCP: Corwin Levins, MD REFERRING PROVIDER: Corwin Levins, MD  END OF SESSION:  OT End of Session - 05/16/23 1313     Visit Number 5    Number of Visits 16    Authorization Type Medicare A&B / BCBS    OT Start Time 1315    OT Stop Time 1400    OT Time Calculation (min) 45 min    Activity Tolerance Patient tolerated treatment well    Behavior During Therapy [facility] for tasks assessed/performed             Past Medical History:  Diagnosis Date   DIABETES MELLITUS, TYPE II 02/18/2009   Qualifier: Diagnosis of  By: Jonny Ruiz MD, Len Blalock    Diverticulosis 08/31/2012   Colonoscopy, June 10, 2009, Dr Perry/GI   ERECTILE DYSFUNCTION 02/18/2009   Qualifier: Diagnosis of  By: Jonny Ruiz MD, Len Blalock    Erectile dysfunction 09/07/2012   HYPERLIPIDEMIA 02/18/2009   Qualifier: Diagnosis of  By: Jonny Ruiz MD, Len Blalock    HYPERTENSION 02/18/2009   Qualifier: Diagnosis of  By: Jonny Ruiz MD, Len Blalock    Past Surgical History:  Procedure Laterality Date   COLONOSCOPY  06/10/2009   right knee cartilage  12/26/1974   Patient Active Problem List   Diagnosis Date Noted   Acute stroke due to ischemia (HCC) 03/28/2023   Right foot drop 03/28/2023   CVA (cerebral vascular accident) (HCC) 03/19/2023   Vitamin D deficiency 07/31/2022   COPD (chronic obstructive pulmonary disease) (HCC) 07/29/2022   Pulmonary nodule, left 08/09/2021   Abnormal physical evaluation 07/15/2021   Screen for colon cancer 07/15/2021   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   Type II diabetes mellitus with manifestations (HCC) 07/15/2021   Hyperlipidemia LDL goal <70 07/15/2021   Tachycardia 07/15/2021   PVC (premature ventricular contraction) 07/15/2021   Insulin dependent type 2 diabetes mellitus (HCC) 07/15/2021   Lung nodule 07/15/2021   EKG abnormality 09/07/2012   Erectile dysfunction  09/07/2012   Snoring 09/07/2012   Encounter for well adult exam with abnormal findings 08/31/2012   Diverticulosis 08/31/2012   Essential hypertension 02/18/2009    ONSET DATE: 03/31/2023 (referral date); 03/20/2023 onset date  REFERRING DIAG: R29.898 (ICD-10-CM) - Right arm weakness  THERAPY DIAG:  Other lack of coordination  Muscle weakness (generalized)  Right arm weakness  Unsteadiness on feet  Stiffness of right shoulder, not elsewhere classified  Rationale for Evaluation and Treatment: Rehabilitation  SUBJECTIVE:   SUBJECTIVE STATEMENT:  Pt goes by "Ethan Boyd." Pt without complaint today, but has band-aid to right elbow. Pt reports due to scraping it while picking up magnolia leaves.  Pt accompanied by: self Pt's wife, [name], reports fine motor, gripping, cutting food have been difficult. She also reports patient is competitive and likes to know if he is doing well typically on a letter grade system. Pt likes to understand the why behind treatment.  PERTINENT HISTORY: HTN, COPD, insulin-dependent T2DM, HLD MRI brain was revealing for acute infarct of left pons during hospitalization in March. Repeat MRI brain 03/29/23 - IMPRESSION: Interval evolution of the now subacute infarct in the left paramedian pons. No new acute infarct or intracranial hemorrhage.  PRECAUTIONS: Fall  WEIGHT BEARING RESTRICTIONS: No  PAIN:  Are you having pain? No  FALLS: Has patient fallen in last 6 months? No  LIVING ENVIRONMENT: Lives with: lives with their spouse Lives  in: House/apartment - Two level house with bedroom and bathroom downstairs Stairs: Yes: Internal: 7 steps to landing then 7 more steps; left hand rail for 7 steps, then handrail on both sides for second set and External: 4 steps; on left going up Has following equipment at home: Single point cane, Walker - 2 wheeled, Wheelchair (transport), and Grab bars Bathroom setup: upstairs bathroom walk-in shower with small threshold with  grab bars and built-in shower seat. Regular height commode  PLOF:  ADLs independent, IADLs independent for what patient was responsible for, driving, retired Chief Operating Officer), patient has two adult children ([name] and [name]) , enjoys golfing, ride around in truck to visit friends  PATIENT GOALS: Pt wants to be "back to where I was in February" with no limitations/weakness.   OBJECTIVE:   HAND DOMINANCE: Right, however patient using left hand recently  ADLs:  Eating: Difficulty cutting food, mild difficulty holding cups pending the weight (drinks from [facility] with straw to prevent spilling), slower to not spill Grooming: Brushing teeth with left mostly, slow on the right, not using q-tips currently UB Dressing: Difficulty with buttons, but otherwise independent LB Dressing: Independent Toileting: Independent, mostly using left hand for wiping Bathing: independent Tub Shower transfers: Independent Equipment: Grab bars and Walk in shower  IADLs: Shopping: Wife now doing grocery shopping where as pt would before. Concern for fatigue, balance, and overhead reaching Light housekeeping: Wife performing, but pt would share responsibility prior (goal to fold clothes) Meal Prep: Wife did most of the cooking before. Does not want to address Community mobility: Utilizing SPC 70% of the time. Pt does not use in the house, but if he leaves the house at all. Medication management: Independent, now have pop up tops.  Financial management: Independent Handwriting: 75% legible, improved gripping of pen as well per his report.  MOBILITY STATUS: Independent with SPC  POSTURE COMMENTS:  No Significant postural limitations Sitting balance:  Independent  ACTIVITY TOLERANCE: Activity tolerance: Pt reports he would force himself to get through every aisle of the grocery store, but it would be difficult.   FUNCTIONAL OUTCOME MEASURES: FOTO: Completed 4/23 score 61  UPPER EXTREMITY ROM:    Active ROM  Right eval Left eval  Shoulder flexion 100* WFL  Shoulder abduction 110* WFL  Shoulder adduction    Shoulder extension    Shoulder internal rotation Hip ht [facility]  Shoulder external rotation [facility]  Elbow flexion [facility] WFL  Elbow extension [facility] WFL  Wrist flexion 40* WFL  Wrist extension [facility] WFL  Wrist ulnar deviation    Wrist radial deviation    Wrist pronation [facility] WFL  Wrist supination WFL WFL  (Blank rows = not tested)  Grasp slightly worse of right compared to left  UPPER EXTREMITY MMT:     MMT Right eval Left eval  Shoulder flexion 4-   Shoulder abduction    Shoulder adduction    Shoulder extension 4-   Shoulder internal rotation    Shoulder external rotation    Middle trapezius    Lower trapezius    Elbow flexion 4-   Elbow extension 4-   Wrist flexion    Wrist extension    Wrist ulnar deviation    Wrist radial deviation    Wrist pronation    Wrist supination    (Blank rows = not tested)  Grasp 3+ right hand  HAND FUNCTION: Grip strength: Right: 40.7 lbs; Left: 79.3 lbs and Lateral pinch: Right: 8 lbs, Left: 10 lbs -04/28/23  3-pt pinch: Right 6lbs  COORDINATION: 9 Hole Peg test: Right: 85 sec; Left: 29 sec Box and Blocks:  Right 45 blocks, Left 52 blocks - 04/28/23  SENSATION: WFL per patient report; however, may benefit from stereognosis  EDEMA: None  MUSCLE TONE: Appears to be WFL  COGNITION: Overall cognitive status: Within functional limits for tasks assessed  VISION: Subjective report: No vision changes noted Baseline vision: No visual deficits  VISION ASSESSMENT: Not tested  Patient has difficulty with following activities due to following visual impairments: None  PERCEPTION: WFL  PRAXIS: WFL  OBSERVATIONS: Pt able to walk from lobby area to table utilizing SPC. No loss of balance. Pt frequently utilizing left hand over right, although notes being right handed. Pt well kept and has supportive spouse.  04/28/23: Pt ambulatory with SPC,  no loss of balance, appears to hike hip a bit. Pt well-kept. Wife present in lobby, but did not join patient.  05/10/13: Similar appearance with ambulation SPC.   TODAY'S TREATMENT:                                                                                                                              DATE:   Neuromuscular re-education: Pt engaging in standing RUE HEP with hand over hand assist to RUE to perform exercises as follows for correct form and neuromuscular retraining: -Scapular elevation/depression with large red yoga ball, sliding up and down wall -Shoulder flexion and abduction incorporating scapula for 2 o'clock, 3 o'clock, and 4 o'clock positions with red theraband tapping out to those points -Wall push-ups -One side scapular push-up  Pt performing 5-8 reps each x 2 sets with seated rest as needed.  PATIENT EDUCATION: Education details: RUE strengthening, benefit of high-intensity repetitive training for improvement Person educated: Patient Education method: Explanation, Demonstration, Tactile cues, and Verbal cues Education comprehension: verbalized understanding, returned demonstration, and needs further education  HOME EXERCISE PROGRAM: 04/28/23: Access Code: [email or web address] URL: [email or web address] Date: 04/28/2023 Prepared by: Pammie Chirino  05/11/23: Access Code: [email or web address] URL: [email or web address]  05/12/23: None 05/16/23: None   GOALS: Goals reviewed with patient? Yes  SHORT TERM GOALS: Target date: 05/16/2023   Pt will demonstrate improved ease with fastening buttons as evidenced by decreasing 3 button/unbutton time by TBD seconds. Baseline: Wife assists with polo shirts, need to address time it takes to complete Goal status: IN PROGRESS  2.  Pt will demonstrate improved fine motor coordination for ADLs as evidenced by decreasing 9 hole peg test score for Rt hand by 10 secs. Baseline: Rt - 85 sec Goal status: IN PROGRESS  3.   Pt will improve Rt grip strength by >/=10 lbs next visit as needed for daily activity. Baseline: Will test next visit 04/28/23: Rt 40.7 lbs Goal status: IN PROGRESS  4.  Pt will improve Rt shoulder flexion AROM to 120* required for overhead reaching tasks. Baseline: Rt shoulder flexion 100* Goal status: IN PROGRESS  5.  Pt will improve handwriting  legibility to 100% in order to write checks. Baseline: 75% legible Goal status: IN PROGRESS  6.  Pt will complete functional activity task in standing for up to 10 minutes without therapeutic rest break to simulate shopping tasks. Baseline: Unable currently for grocery shopping Goal status: IN PROGRESS  LONG TERM GOALS: Target date: 06/13/2023  Pt will complete FOTO assessment at time of discharge scoring 73 or greater indicating functional progression with ADL and IADL completion. Baseline: 61 Goal status: IN PROGRESS  2.  Pt will demonstrate improved fine motor coordination for ADLs as evidenced by decreasing 9 hole peg test score for Rt hand by 25 secs Baseline: Rt - 85 sec Goal status: IN PROGRESS  3.  Pt will be able to place at least 50 blocks using Rt hand with completion of Box and Blocks test. Baseline: Will test next visit 04/28/23: Rt 45 blocks Goal status: IN PROGRESS  4.  Pt will improve Rt shoulder flexion AROM to 140* required for overhead reaching tasks. Baseline: Rt shoulder flexion 100* Goal status: IN PROGRESS  5.  Pt will be independent with HEP. Baseline:  Goal status: IN PROGRESS  6.  Pt will fold and put away basket of clothes independently. Baseline: Unable currently Goal status: IN PROGRESS  ASSESSMENT:  CLINICAL IMPRESSION: Pt tolerating treatment well, and making steady progress. Pt able to recall previous HEPs. Pt continues to be limited by RUE weakness and coordination deficits. Pt also with diminished A/ROM of extensor muscles controlling wrist extension. Pt reports difficulty washing face in that his  right hand does not quite match the left. Encouraged to perform this task repetitively for increased reps to maximize return of motion. Pt receptive. Pt would continue to benefit from skilled OT services to address performance deficits.  PERFORMANCE DEFICITS: in functional skills including ADLs, IADLs, coordination, dexterity, sensation, ROM, strength, Fine motor control, Gross motor control, mobility, balance, body mechanics, endurance, and UE functional use, and psychosocial skills including interpersonal interactions and routines and behaviors.   IMPAIRMENTS: are limiting patient from ADLs, IADLs, leisure, social participation, and driving .   CO-MORBIDITIES: may have co-morbidities  that affects occupational performance. Patient will benefit from skilled OT to address above impairments and improve overall function.  MODIFICATION OR ASSISTANCE TO COMPLETE EVALUATION: No modification of tasks or assist necessary to complete an evaluation.  OT OCCUPATIONAL PROFILE AND HISTORY: Detailed assessment: Review of records and additional review of physical, cognitive, psychosocial history related to current functional performance.  CLINICAL DECISION MAKING: Moderate - several treatment options, min-mod task modification necessary  REHAB POTENTIAL: Good  EVALUATION COMPLEXITY: Low    PLAN:  OT FREQUENCY: 2x/week  OT DURATION: 8 weeks  PLANNED INTERVENTIONS: self care/ADL training, therapeutic exercise, therapeutic activity, neuromuscular re-education, passive range of motion, gait training, balance training, functional mobility training, electrical stimulation, ultrasound, moist heat, cryotherapy, patient/family education, cognitive remediation/compensation, visual/perceptual remediation/compensation, psychosocial skills training, energy conservation, coping strategies training, DME and/or AE instructions, and Re-evaluation  RECOMMENDED OTHER SERVICES: None  CONSULTED AND AGREED WITH PLAN OF  CARE: Patient and family member/caregiver  PLAN FOR NEXT SESSION: coordination exercises, inhibitory techniques to forearm to challenge hand/digits and/or wrist HEP, weighted clothes pin, more scapula HEP, 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

## 2023-05-19 ENCOUNTER — Ambulatory Visit: Payer: Medicare Other

## 2023-05-19 DIAGNOSIS — M6281 Muscle weakness (generalized): Secondary | ICD-10-CM

## 2023-05-19 DIAGNOSIS — R29898 Other symptoms and signs involving the musculoskeletal system: Secondary | ICD-10-CM

## 2023-05-19 DIAGNOSIS — M25611 Stiffness of right shoulder, not elsewhere classified: Secondary | ICD-10-CM

## 2023-05-19 DIAGNOSIS — R278 Other lack of coordination: Secondary | ICD-10-CM

## 2023-05-19 DIAGNOSIS — R2681 Unsteadiness on feet: Secondary | ICD-10-CM

## 2023-05-19 NOTE — Therapy (Signed)
OUTPATIENT OCCUPATIONAL THERAPY NEURO TREATMENT  Patient Name: Ethan Boyd MRN: [id number] DOB:1956/12/12, 67 y.o., male Today's Date: 05/19/2023  PCP: Ethan Levins, MD REFERRING PROVIDER: Corwin Levins, MD  END OF SESSION:  OT End of Session - 05/19/23 1319     Visit Number 6    Number of Visits 16    Authorization Type Medicare A&B / BCBS    OT Start Time 1317    OT Stop Time 1406    OT Time Calculation (min) 49 min    Equipment Utilized During Treatment single point cane, mat table    Activity Tolerance Patient tolerated treatment well    Behavior During Therapy [facility] for tasks assessed/performed             Past Medical History:  Diagnosis Date   DIABETES MELLITUS, TYPE II 02/18/2009   Qualifier: Diagnosis of  By: Ethan Ruiz MD, Ethan Boyd    Diverticulosis 08/31/2012   Colonoscopy, June 10, 2009, Dr Ethan Boyd/GI   ERECTILE DYSFUNCTION 02/18/2009   Qualifier: Diagnosis of  By: Ethan Ruiz MD, Ethan Boyd    Erectile dysfunction 09/07/2012   HYPERLIPIDEMIA 02/18/2009   Qualifier: Diagnosis of  By: Ethan Ruiz MD, Ethan Boyd    HYPERTENSION 02/18/2009   Qualifier: Diagnosis of  By: Ethan Ruiz MD, Ethan Boyd    Past Surgical History:  Procedure Laterality Date   COLONOSCOPY  06/10/2009   right knee cartilage  12/26/1974   Patient Active Problem List   Diagnosis Date Noted   Acute stroke due to ischemia (HCC) 03/28/2023   Right foot drop 03/28/2023   CVA (cerebral vascular accident) (HCC) 03/19/2023   Vitamin D deficiency 07/31/2022   COPD (chronic obstructive pulmonary disease) (HCC) 07/29/2022   Pulmonary nodule, left 08/09/2021   Abnormal physical evaluation 07/15/2021   Screen for colon cancer 07/15/2021   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   Type II diabetes mellitus with manifestations (HCC) 07/15/2021   Hyperlipidemia LDL goal <70 07/15/2021   Tachycardia 07/15/2021   PVC (premature ventricular contraction) 07/15/2021   Insulin dependent type 2 diabetes mellitus (HCC) 07/15/2021   Lung  nodule 07/15/2021   EKG abnormality 09/07/2012   Erectile dysfunction 09/07/2012   Snoring 09/07/2012   Encounter for well adult exam with abnormal findings 08/31/2012   Diverticulosis 08/31/2012   Essential hypertension 02/18/2009    ONSET DATE: 03/31/2023 (referral date); 03/20/2023 onset date  REFERRING DIAG: R29.898 (ICD-10-CM) - Right arm weakness  THERAPY DIAG:  Other lack of coordination  Muscle weakness (generalized)  Right arm weakness  Unsteadiness on feet  Stiffness of right shoulder, not elsewhere classified  Rationale for Evaluation and Treatment: Rehabilitation  SUBJECTIVE:   SUBJECTIVE STATEMENT:  Pt goes by "Ethan Boyd." Pt reports doing well.  Pt accompanied by: self Pt's wife, Ethan Boyd, reports fine motor, gripping, cutting food have been difficult. She also reports patient is competitive and likes to know if he is doing well typically on a letter grade system. Pt likes to understand the why behind treatment.  PERTINENT HISTORY: HTN, COPD, insulin-dependent T2DM, HLD MRI brain was revealing for acute infarct of left pons during hospitalization in March. Repeat MRI brain 03/29/23 - IMPRESSION: Interval evolution of the now subacute infarct in the left paramedian pons. No new acute infarct or intracranial hemorrhage.  PRECAUTIONS: Fall  WEIGHT BEARING RESTRICTIONS: No  PAIN:  Are you having pain? No  FALLS: Has patient fallen in last 6 months? No  LIVING ENVIRONMENT: Lives with: lives with their spouse Lives in: House/apartment - Two level  house with bedroom and bathroom downstairs Stairs: Yes: Internal: 7 steps to landing then 7 more steps; left hand rail for 7 steps, then handrail on both sides for second set and External: 4 steps; on left going up Has following equipment at home: Single point cane, Walker - 2 wheeled, Wheelchair (transport), and Grab bars Bathroom setup: upstairs bathroom walk-in shower with small threshold with grab bars and built-in shower  seat. Regular height commode  PLOF:  ADLs independent, IADLs independent for what patient was responsible for, driving, retired Chief Operating Officer), patient has two adult children (Ethan Boyd and Ethan Boyd) , enjoys golfing, ride around in truck to visit friends  PATIENT GOALS: Pt wants to be "back to where I was in February" with no limitations/weakness.   OBJECTIVE:   HAND DOMINANCE: Right, however patient using left hand recently  ADLs:  Eating: Difficulty cutting food, mild difficulty holding cups pending the weight (drinks from [facility] with straw to prevent spilling), slower to not spill Grooming: Brushing teeth with left mostly, slow on the right, not using q-tips currently UB Dressing: Difficulty with buttons, but otherwise independent LB Dressing: Independent Toileting: Independent, mostly using left hand for wiping Bathing: independent Tub Shower transfers: Independent Equipment: Grab bars and Walk in shower  IADLs: Shopping: Wife now doing grocery shopping where as pt would before. Concern for fatigue, balance, and overhead reaching Light housekeeping: Wife performing, but pt would share responsibility prior (goal to fold clothes) Meal Prep: Wife did most of the cooking before. Does not want to address Community mobility: Utilizing SPC 70% of the time. Pt does not use in the house, but if he leaves the house at all. Medication management: Independent, now have pop up tops.  Financial management: Independent Handwriting: 75% legible, improved gripping of pen as well per his report.  MOBILITY STATUS: Independent with SPC  POSTURE COMMENTS:  No Significant postural limitations Sitting balance:  Independent  ACTIVITY TOLERANCE: Activity tolerance: Pt reports he would force himself to get through every aisle of the grocery store, but it would be difficult.   FUNCTIONAL OUTCOME MEASURES: FOTO: Completed 4/23 score 61  UPPER EXTREMITY ROM:    Active ROM Right eval Left eval   Shoulder flexion 100* WFL  Shoulder abduction 110* WFL  Shoulder adduction    Shoulder extension    Shoulder internal rotation Hip ht [facility]  Shoulder external rotation [facility]  Elbow flexion [facility] WFL  Elbow extension [facility] WFL  Wrist flexion 40* WFL  Wrist extension [facility] WFL  Wrist ulnar deviation    Wrist radial deviation    Wrist pronation [facility] WFL  Wrist supination WFL WFL  (Blank rows = not tested)  Grasp slightly worse of right compared to left  UPPER EXTREMITY MMT:     MMT Right eval Left eval  Shoulder flexion 4-   Shoulder abduction    Shoulder adduction    Shoulder extension 4-   Shoulder internal rotation    Shoulder external rotation    Middle trapezius    Lower trapezius    Elbow flexion 4-   Elbow extension 4-   Wrist flexion    Wrist extension    Wrist ulnar deviation    Wrist radial deviation    Wrist pronation    Wrist supination    (Blank rows = not tested)  Grasp 3+ right hand  HAND FUNCTION: Grip strength: Right: 40.7 lbs; Left: 79.3 lbs and Lateral pinch: Right: 8 lbs, Left: 10 lbs -04/28/23 3-pt pinch: Right 6lbs  COORDINATION: 9 Hole Peg test: Right: 85 sec; Left: 29 sec Box and Blocks:  Right 45 blocks, Left 52 blocks - 04/28/23  SENSATION: WFL per patient report; however, may benefit from stereognosis  EDEMA: None  MUSCLE TONE: Appears to be WFL  COGNITION: Overall cognitive status: Within functional limits for tasks assessed  VISION: Subjective report: No vision changes noted Baseline vision: No visual deficits  VISION ASSESSMENT: Not tested  Patient has difficulty with following activities due to following visual impairments: None  PERCEPTION: WFL  PRAXIS: WFL  OBSERVATIONS: Pt able to walk from lobby area to table utilizing SPC. No loss of balance. Pt frequently utilizing left hand over right, although notes being right handed. Pt well kept and has supportive spouse.  04/28/23: Pt ambulatory with SPC, no loss of balance,  appears to hike hip a bit. Pt well-kept. Wife present in lobby, but did not join patient.  05/10/13: Similar appearance with ambulation SPC. 05/19/23: No changes   TODAY'S TREATMENT:                                                                                                                              DATE:   Pt performing exercises on mat table for RUE scapula strengthening and to reduce anterior shoulder pain pt has been experiencing. Pt performing x 10 reps x 2 sets of the following exercises listed below. Pt tolerating treatment well. Pt requiring seated rest as needed with verbal, tactile and visual cues for correct movement.  - Modified Bird Dog (on all fours and only raising LUE up while keeping RUE down)  - 1 x daily - 7 x weekly - 3 sets - 10 reps - Prone Shoulder Flexion  - 1 x daily - 7 x weekly - 3 sets - 10 reps - Prone Single Arm Shoulder Scaption Palm Anatomical position  - 1 x daily - 7 x weekly - 3 sets - 10 reps - Prone Single Arm Shoulder Horizontal Abduction - Palm Down  - 1 x daily - 7 x weekly - 3 sets - 10 reps  PATIENT EDUCATION: Education details: Scapula HEP Person educated: Patient Education method: Explanation, Demonstration, Tactile cues, and Verbal cues Education comprehension: verbalized understanding, returned demonstration, and needs further education  HOME EXERCISE PROGRAM: 04/28/23: Access Code: [email or web address] URL: [email or web address] Date: 04/28/2023 Prepared by: Elmar Antigua  05/11/23: Access Code: [email or web address] URL: [email or web address]  05/12/23: None 05/16/23: None 05/19/23: Access Code: [email or web address] URL: [email or web address]  GOALS: Goals reviewed with patient? Yes  SHORT TERM GOALS: Target date: 05/16/2023   Pt will demonstrate improved ease with fastening buttons as evidenced by decreasing 3 button/unbutton time by TBD seconds. Baseline: Wife assists with polo shirts, need to address time it takes to  complete Goal status: IN PROGRESS  2.  Pt will demonstrate improved fine motor coordination for ADLs as evidenced by decreasing 9 hole peg test score for Rt hand by 10 secs. Baseline: Rt - 85 sec Goal  status: IN PROGRESS  3.  Pt will improve Rt grip strength by >/=10 lbs next visit as needed for daily activity. Baseline: Will test next visit 04/28/23: Rt 40.7 lbs Goal status: IN PROGRESS  4.  Pt will improve Rt shoulder flexion AROM to 120* required for overhead reaching tasks. Baseline: Rt shoulder flexion 100* Goal status: IN PROGRESS  5.  Pt will improve handwriting legibility to 100% in order to write checks. Baseline: 75% legible Goal status: IN PROGRESS  6.  Pt will complete functional activity task in standing for up to 10 minutes without therapeutic rest break to simulate shopping tasks. Baseline: Unable currently for grocery shopping Goal status: IN PROGRESS  LONG TERM GOALS: Target date: 06/13/2023  Pt will complete FOTO assessment at time of discharge scoring 73 or greater indicating functional progression with ADL and IADL completion. Baseline: 61 Goal status: IN PROGRESS  2.  Pt will demonstrate improved fine motor coordination for ADLs as evidenced by decreasing 9 hole peg test score for Rt hand by 25 secs Baseline: Rt - 85 sec Goal status: IN PROGRESS  3.  Pt will be able to place at least 50 blocks using Rt hand with completion of Box and Blocks test. Baseline: Will test next visit 04/28/23: Rt 45 blocks Goal status: IN PROGRESS  4.  Pt will improve Rt shoulder flexion AROM to 140* required for overhead reaching tasks. Baseline: Rt shoulder flexion 100* Goal status: IN PROGRESS  5.  Pt will be independent with HEP. Baseline:  Goal status: IN PROGRESS  6.  Pt will fold and put away basket of clothes independently. Baseline: Unable currently Goal status: IN PROGRESS  ASSESSMENT:  CLINICAL IMPRESSION: Pt continues to make steady progress. Discussed today  reviewing goals and re-assessing objective testing at 10th visit as pt inquiring about progress and POC. Pt would continue to benefit from skilled OT services to address Right-sided deficits impacting functional independence in daily activities.  PERFORMANCE DEFICITS: in functional skills including ADLs, IADLs, coordination, dexterity, sensation, ROM, strength, Fine motor control, Gross motor control, mobility, balance, body mechanics, endurance, and UE functional use, and psychosocial skills including interpersonal interactions and routines and behaviors.   IMPAIRMENTS: are limiting patient from ADLs, IADLs, leisure, social participation, and driving .   CO-MORBIDITIES: may have co-morbidities  that affects occupational performance. Patient will benefit from skilled OT to address above impairments and improve overall function.  MODIFICATION OR ASSISTANCE TO COMPLETE EVALUATION: No modification of tasks or assist necessary to complete an evaluation.  OT OCCUPATIONAL PROFILE AND HISTORY: Detailed assessment: Review of records and additional review of physical, cognitive, psychosocial history related to current functional performance.  CLINICAL DECISION MAKING: Moderate - several treatment options, min-mod task modification necessary  REHAB POTENTIAL: Good  EVALUATION COMPLEXITY: Low    PLAN:  OT FREQUENCY: 2x/week  OT DURATION: 8 weeks  PLANNED INTERVENTIONS: self care/ADL training, therapeutic exercise, therapeutic activity, neuromuscular re-education, passive range of motion, gait training, balance training, functional mobility training, electrical stimulation, ultrasound, moist heat, cryotherapy, patient/family education, cognitive remediation/compensation, visual/perceptual remediation/compensation, psychosocial skills training, energy conservation, coping strategies training, DME and/or AE instructions, and Re-evaluation  RECOMMENDED OTHER SERVICES: None  CONSULTED AND AGREED WITH  PLAN OF CARE: Patient and family member/caregiver  PLAN FOR NEXT SESSION: Review latest HEP, coordination exercises, inhibitory techniques to forearm to challenge hand/digits and/or wrist HEP, weighted clothes pin, consider stirring of materials (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

## 2023-05-23 ENCOUNTER — Ambulatory Visit: Payer: Medicare Other

## 2023-05-23 DIAGNOSIS — R278 Other lack of coordination: Secondary | ICD-10-CM

## 2023-05-23 DIAGNOSIS — M6281 Muscle weakness (generalized): Secondary | ICD-10-CM | POA: Diagnosis not present

## 2023-05-23 DIAGNOSIS — M25611 Stiffness of right shoulder, not elsewhere classified: Secondary | ICD-10-CM

## 2023-05-23 DIAGNOSIS — R2681 Unsteadiness on feet: Secondary | ICD-10-CM

## 2023-05-23 DIAGNOSIS — R29898 Other symptoms and signs involving the musculoskeletal system: Secondary | ICD-10-CM

## 2023-05-23 NOTE — Therapy (Signed)
OUTPATIENT OCCUPATIONAL THERAPY NEURO TREATMENT  Patient Name: Ethan Boyd MRN: [id number] DOB:21-Jul-1956, 67 y.o., male Today's Date: 05/23/2023  PCP: Corwin Levins, MD REFERRING PROVIDER: Corwin Levins, MD  END OF SESSION:  OT End of Session - 05/23/23 1309     Visit Number 7    Number of Visits 16    Authorization Type Medicare A&B / BCBS    OT Start Time 1310    OT Stop Time 1358    OT Time Calculation (min) 48 min    Equipment Utilized During Treatment green theraputty, 2lb and 3lb dumbbells    Activity Tolerance Patient tolerated treatment well    Behavior During Therapy WFL for tasks assessed/performed             Past Medical History:  Diagnosis Date   DIABETES MELLITUS, TYPE II 02/18/2009   Qualifier: Diagnosis of  By: Jonny Ruiz MD, Len Blalock    Diverticulosis 08/31/2012   Colonoscopy, June 10, 2009, Dr Perry/GI   ERECTILE DYSFUNCTION 02/18/2009   Qualifier: Diagnosis of  By: Jonny Ruiz MD, Len Blalock    Erectile dysfunction 09/07/2012   HYPERLIPIDEMIA 02/18/2009   Qualifier: Diagnosis of  By: Jonny Ruiz MD, Len Blalock    HYPERTENSION 02/18/2009   Qualifier: Diagnosis of  By: Jonny Ruiz MD, Len Blalock    Past Surgical History:  Procedure Laterality Date   COLONOSCOPY  06/10/2009   right knee cartilage  12/26/1974   Patient Active Problem List   Diagnosis Date Noted   Acute stroke due to ischemia (HCC) 03/28/2023   Right foot drop 03/28/2023   CVA (cerebral vascular accident) (HCC) 03/19/2023   Vitamin D deficiency 07/31/2022   COPD (chronic obstructive pulmonary disease) (HCC) 07/29/2022   Pulmonary nodule, left 08/09/2021   Abnormal physical evaluation 07/15/2021   Screen for colon cancer 07/15/2021   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   Type II diabetes mellitus with manifestations (HCC) 07/15/2021   Hyperlipidemia LDL goal <70 07/15/2021   Tachycardia 07/15/2021   PVC (premature ventricular contraction) 07/15/2021   Insulin dependent type 2 diabetes mellitus (HCC) 07/15/2021    Lung nodule 07/15/2021   EKG abnormality 09/07/2012   Erectile dysfunction 09/07/2012   Snoring 09/07/2012   Encounter for well adult exam with abnormal findings 08/31/2012   Diverticulosis 08/31/2012   Essential hypertension 02/18/2009    ONSET DATE: 03/31/2023 (referral date); 03/20/2023 onset date  REFERRING DIAG: R29.898 (ICD-10-CM) - Right arm weakness  THERAPY DIAG:  Other lack of coordination  Muscle weakness (generalized)  Right arm weakness  Unsteadiness on feet  Stiffness of right shoulder, not elsewhere classified  Rationale for Evaluation and Treatment: Rehabilitation  SUBJECTIVE:   SUBJECTIVE STATEMENT:  Pt goes by "[name]." Pt asking more questions about tone and muscles being more stiff first thing in the morning.  Pt accompanied by: self Pt's wife, [name], reports fine motor, gripping, cutting food have been difficult. She also reports patient is competitive and likes to know if he is doing well typically on a letter grade system. Pt likes to understand the why behind treatment.  PERTINENT HISTORY: HTN, COPD, insulin-dependent T2DM, HLD MRI brain was revealing for acute infarct of left pons during hospitalization in March. Repeat MRI brain 03/29/23 - IMPRESSION: Interval evolution of the now subacute infarct in the left paramedian pons. No new acute infarct or intracranial hemorrhage.  PRECAUTIONS: Fall  WEIGHT BEARING RESTRICTIONS: No  PAIN:  Are you having pain? No  FALLS: Has patient fallen in last 6 months? No  LIVING  ENVIRONMENT: Lives with: lives with their spouse Lives in: House/apartment - Two level house with bedroom and bathroom downstairs Stairs: Yes: Internal: 7 steps to landing then 7 more steps; left hand rail for 7 steps, then handrail on both sides for second set and External: 4 steps; on left going up Has following equipment at home: Single point cane, Walker - 2 wheeled, Wheelchair (transport), and Grab bars Bathroom setup: upstairs  bathroom walk-in shower with small threshold with grab bars and built-in shower seat. Regular height commode  PLOF:  ADLs independent, IADLs independent for what patient was responsible for, driving, retired Chief Operating Officer), patient has two adult children ([name] and [name]) , enjoys golfing, ride around in truck to visit friends  PATIENT GOALS: Pt wants to be "back to where I was in February" with no limitations/weakness.   OBJECTIVE:   HAND DOMINANCE: Right, however patient using left hand recently  ADLs:  Eating: Difficulty cutting food, mild difficulty holding cups pending the weight (drinks from [facility] with straw to prevent spilling), slower to not spill Grooming: Brushing teeth with left mostly, slow on the right, not using q-tips currently UB Dressing: Difficulty with buttons, but otherwise independent LB Dressing: Independent Toileting: Independent, mostly using left hand for wiping Bathing: independent Tub Shower transfers: Independent Equipment: Grab bars and Walk in shower  IADLs: Shopping: Wife now doing grocery shopping where as pt would before. Concern for fatigue, balance, and overhead reaching Light housekeeping: Wife performing, but pt would share responsibility prior (goal to fold clothes) Meal Prep: Wife did most of the cooking before. Does not want to address Community mobility: Utilizing SPC 70% of the time. Pt does not use in the house, but if he leaves the house at all. Medication management: Independent, now have pop up tops.  Financial management: Independent Handwriting: 75% legible, improved gripping of pen as well per his report.  MOBILITY STATUS: Independent with SPC  POSTURE COMMENTS:  No Significant postural limitations Sitting balance:  Independent  ACTIVITY TOLERANCE: Activity tolerance: Pt reports he would force himself to get through every aisle of the grocery store, but it would be difficult.   FUNCTIONAL OUTCOME MEASURES: FOTO: Completed 4/23  score 61  UPPER EXTREMITY ROM:    Active ROM Right eval Left eval  Shoulder flexion 100* WFL  Shoulder abduction 110* WFL  Shoulder adduction    Shoulder extension    Shoulder internal rotation Hip ht [facility]  Shoulder external rotation [facility]  Elbow flexion [facility] WFL  Elbow extension [facility] WFL  Wrist flexion 40* WFL  Wrist extension [facility] WFL  Wrist ulnar deviation    Wrist radial deviation    Wrist pronation [facility] WFL  Wrist supination WFL WFL  (Blank rows = not tested)  Grasp slightly worse of right compared to left  UPPER EXTREMITY MMT:     MMT Right eval Left eval  Shoulder flexion 4-   Shoulder abduction    Shoulder adduction    Shoulder extension 4-   Shoulder internal rotation    Shoulder external rotation    Middle trapezius    Lower trapezius    Elbow flexion 4-   Elbow extension 4-   Wrist flexion    Wrist extension    Wrist ulnar deviation    Wrist radial deviation    Wrist pronation    Wrist supination    (Blank rows = not tested)  Grasp 3+ right hand  HAND FUNCTION: Grip strength: Right: 40.7 lbs; Left: 79.3 lbs and Lateral  pinch: Right: 8 lbs, Left: 10 lbs -04/28/23 3-pt pinch: Right 6lbs  COORDINATION: 9 Hole Peg test: Right: 85 sec; Left: 29 sec Box and Blocks:  Right 45 blocks, Left 52 blocks - 04/28/23  SENSATION: WFL per patient report; however, may benefit from stereognosis  EDEMA: None  MUSCLE TONE: Appears to be WFL  COGNITION: Overall cognitive status: Within functional limits for tasks assessed  VISION: Subjective report: No vision changes noted Baseline vision: No visual deficits  VISION ASSESSMENT: Not tested  Patient has difficulty with following activities due to following visual impairments: None  PERCEPTION: WFL  PRAXIS: WFL  OBSERVATIONS: Pt able to walk from lobby area to table utilizing SPC. No loss of balance. Pt frequently utilizing left hand over right, although notes being right handed. Pt well kept and has  supportive spouse.  04/28/23: Pt ambulatory with SPC, no loss of balance, appears to hike hip a bit. Pt well-kept. Wife present in lobby, but did not join patient.  05/10/13: Similar appearance with ambulation SPC. 05/19/23: No changes 05/23/23: Pt ambulatory without device today.   TODAY'S TREATMENT:                                                                                                                              DATE:   Reviewed pt goals today to ensure therapeutic intervention matching patient centered goals and showing progress. Please see STG and LTG for updates.  Pt bringing in putty this visit to review theraputty HEP due to uncertainty of performing correctly. Pt performing 3/4 exercises correctly requiring demo for key pinch exercise. Pt able to return demo. Please refer to Access Code from 5/3 for theraputty exercises and added exercises from today as follows: - Finger Extension with Putty  - 1 x daily - 3 x weekly - 2 sets - 10 reps - Seated Wrist Extension with Dumbbell  - 1 x daily - 3 x weekly - 2 sets - 10 reps - Seated Wrist Flexion with Dumbbell  - 1 x daily - 3 x weekly - 2 sets - 10 reps   Pt tolerating well; however, green theraputty too resistive for finger extension. Pt reports still having pink theraputty. Instructed to try that at home, but if too difficult, education to perform isolate finger extension instead. Pt receptive. Pt performing seated wrist extension with 3lb dumbbell initially; however, poor movement patterns. Exchanged weight for 2lb and movement pattern much improved. Pt able to perform seated wrist flexion with 5lb dumbbell. Pt unsure if he has 2lb dumbbell at home. Education for use of canned food for similar stimulus.   PATIENT EDUCATION: Education details: Putty and wrist HEP Person educated: Patient Education method: Explanation, Demonstration, Tactile cues, Verbal cues, and Handouts Education comprehension: verbalized understanding, returned  demonstration, and verbal cues required  HOME EXERCISE PROGRAM: 04/28/23: Access Code: [email or web address] URL: [email or web address] Date: 04/28/2023 Prepared by: Glenette Bookwalter  05/11/23: Access Code: [email or web address] URL: [email or web address]  05/12/23: None 05/16/23:  None 05/19/23: Access Code: [email or web address] URL: [email or web address]  GOALS: Goals reviewed with patient? Yes  SHORT TERM GOALS: Target date: 05/16/2023   Pt will demonstrate improved ease with fastening buttons as evidenced by decreasing 3 button/unbutton time by TBD seconds. Baseline: Wife assists with polo shirts, need to address time it takes to complete Goal status: IN PROGRESS  2.  Pt will demonstrate improved fine motor coordination for ADLs as evidenced by decreasing 9 hole peg test score for Rt hand by 10 secs. Baseline: Rt - 85 sec 05/23/23: Rt - 32 secs Goal status: MET  3.  Pt will improve Rt grip strength by >/=20 lbs next visit as needed for daily activity. Baseline: Will test next visit 04/28/23: Rt 40.7 lbs 05/23/23: Rt 51.8 lbs Goal status: REVISED  4.  Pt will improve Rt shoulder flexion AROM to 120* required for overhead reaching tasks. Baseline: Rt shoulder flexion 100* 05/23/23:  Goal status: IN PROGRESS  5.  Pt will improve handwriting legibility to 100% in order to write checks. Baseline: 75% legible Goal status: IN PROGRESS  6.  Pt will complete functional activity task in standing for up to 10 minutes without therapeutic rest break to simulate shopping tasks. Baseline: Unable currently for grocery shopping Goal status: IN PROGRESS  LONG TERM GOALS: Target date: 06/13/2023  Pt will complete FOTO assessment at time of discharge scoring 73 or greater indicating functional progression with ADL and IADL completion. Baseline: 61 Goal status: IN PROGRESS  2.  Pt will demonstrate improved fine motor coordination for ADLs as evidenced by decreasing 9 hole peg test score for  Rt hand by 25 secs Baseline: Rt - 85 sec 05/23/23: Rt - 32 secs Goal status: MET  3.  Pt will be able to place at least 50 blocks using Rt hand with completion of Box and Blocks test. Baseline: Will test next visit 04/28/23: Rt 45 blocks 05/23/23: Rt - 45 Goal status: IN PROGRESS  4.  Pt will improve Rt shoulder flexion AROM to 140* required for overhead reaching tasks. Baseline: Rt shoulder flexion 100* Goal status: IN PROGRESS  5.  Pt will be independent with HEP. Baseline:  Goal status: IN PROGRESS  6.  Pt will fold and put away basket of clothes independently. Baseline: Unable currently Goal status: IN PROGRESS  ASSESSMENT:  CLINICAL IMPRESSION: Pt very motivated and asking questions about this care and HEPs indicating use at home. Pt demonstrating progress today in relation to coordination goal as indicated by improved 9 Hole Peg test score. Pt self-report of ability to cut steak with knife is also improvement. Pt continues to be limited by RUE weakness, fine/gross motor control, and balance. Pt would continue to benefit from skilled OT services to address deficits, and maximize independence in daily occupations.  PERFORMANCE DEFICITS: in functional skills including ADLs, IADLs, coordination, dexterity, sensation, ROM, strength, Fine motor control, Gross motor control, mobility, balance, body mechanics, endurance, and UE functional use, and psychosocial skills including interpersonal interactions and routines and behaviors.   IMPAIRMENTS: are limiting patient from ADLs, IADLs, leisure, social participation, and driving .   CO-MORBIDITIES: may have co-morbidities  that affects occupational performance. Patient will benefit from skilled OT to address above impairments and improve overall function.  MODIFICATION OR ASSISTANCE TO COMPLETE EVALUATION: No modification of tasks or assist necessary to complete an evaluation.  OT OCCUPATIONAL PROFILE AND HISTORY: Detailed assessment:  Review of records and additional review of physical, cognitive, psychosocial history related to current functional performance.  CLINICAL  DECISION MAKING: Moderate - several treatment options, min-mod task modification necessary  REHAB POTENTIAL: Good  EVALUATION COMPLEXITY: Low    PLAN:  OT FREQUENCY: 2x/week  OT DURATION: 8 weeks  PLANNED INTERVENTIONS: self care/ADL training, therapeutic exercise, therapeutic activity, neuromuscular re-education, passive range of motion, gait training, balance training, functional mobility training, electrical stimulation, ultrasound, moist heat, cryotherapy, patient/family education, cognitive remediation/compensation, visual/perceptual remediation/compensation, psychosocial skills training, energy conservation, coping strategies training, DME and/or AE instructions, and Re-evaluation  RECOMMENDED OTHER SERVICES: None  CONSULTED AND AGREED WITH PLAN OF CARE: Patient and family member/caregiver  PLAN FOR NEXT SESSION: Review latest HEP, coordination exercises, weighted clothes pin, consider stirring of materials (use to mix formulas in beakers with stirrers for work), circuit style to address UB strengthening while also doing balance, review goals for tx (buttons, laundry, 10 min standing activities, AROM, handwriting), ask how fishing went from beach trip   [place], [place] 05/23/2023, 2:14 PM

## 2023-05-26 ENCOUNTER — Ambulatory Visit: Payer: Medicare Other

## 2023-05-30 ENCOUNTER — Ambulatory Visit: Payer: Medicare Other | Attending: Internal Medicine | Admitting: Occupational Therapy

## 2023-05-30 DIAGNOSIS — R2681 Unsteadiness on feet: Secondary | ICD-10-CM | POA: Insufficient documentation

## 2023-05-30 DIAGNOSIS — R29898 Other symptoms and signs involving the musculoskeletal system: Secondary | ICD-10-CM | POA: Diagnosis present

## 2023-05-30 DIAGNOSIS — R262 Difficulty in walking, not elsewhere classified: Secondary | ICD-10-CM | POA: Insufficient documentation

## 2023-05-30 DIAGNOSIS — R278 Other lack of coordination: Secondary | ICD-10-CM

## 2023-05-30 DIAGNOSIS — M6281 Muscle weakness (generalized): Secondary | ICD-10-CM | POA: Diagnosis present

## 2023-05-30 DIAGNOSIS — M25611 Stiffness of right shoulder, not elsewhere classified: Secondary | ICD-10-CM

## 2023-05-30 DIAGNOSIS — R2689 Other abnormalities of gait and mobility: Secondary | ICD-10-CM | POA: Insufficient documentation

## 2023-05-30 NOTE — Therapy (Signed)
OUTPATIENT OCCUPATIONAL THERAPY NEURO TREATMENT  Patient Name: Ethan Boyd MRN: [id number] DOB:21-Jan-1956, 67 y.o., male Today's Date: 05/30/2023  PCP: Corwin Levins, MD REFERRING PROVIDER: Corwin Levins, MD  END OF SESSION:  OT End of Session - 05/30/23 1405     Visit Number 8    Number of Visits 16    Authorization Type Medicare A&B / BCBS    OT Start Time 1405    OT Stop Time 1452    OT Time Calculation (min) 47 min    Equipment Utilized During Treatment green theraputty, 2lb and 3lb dumbbells    Activity Tolerance Patient tolerated treatment well    Behavior During Therapy WFL for tasks assessed/performed             Past Medical History:  Diagnosis Date   DIABETES MELLITUS, TYPE II 02/18/2009   Qualifier: Diagnosis of  By: Jonny Ruiz MD, Len Blalock    Diverticulosis 08/31/2012   Colonoscopy, June 10, 2009, Dr Perry/GI   ERECTILE DYSFUNCTION 02/18/2009   Qualifier: Diagnosis of  By: Jonny Ruiz MD, Len Blalock    Erectile dysfunction 09/07/2012   HYPERLIPIDEMIA 02/18/2009   Qualifier: Diagnosis of  By: Jonny Ruiz MD, Len Blalock    HYPERTENSION 02/18/2009   Qualifier: Diagnosis of  By: Jonny Ruiz MD, Len Blalock    Past Surgical History:  Procedure Laterality Date   COLONOSCOPY  06/10/2009   right knee cartilage  12/26/1974   Patient Active Problem List   Diagnosis Date Noted   Acute stroke due to ischemia (HCC) 03/28/2023   Right foot drop 03/28/2023   CVA (cerebral vascular accident) (HCC) 03/19/2023   Vitamin D deficiency 07/31/2022   COPD (chronic obstructive pulmonary disease) (HCC) 07/29/2022   Pulmonary nodule, left 08/09/2021   Abnormal physical evaluation 07/15/2021   Screen for colon cancer 07/15/2021   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   Type II diabetes mellitus with manifestations (HCC) 07/15/2021   Hyperlipidemia LDL goal <70 07/15/2021   Tachycardia 07/15/2021   PVC (premature ventricular contraction) 07/15/2021   Insulin dependent type 2 diabetes mellitus (HCC) 07/15/2021    Lung nodule 07/15/2021   EKG abnormality 09/07/2012   Erectile dysfunction 09/07/2012   Snoring 09/07/2012   Encounter for well adult exam with abnormal findings 08/31/2012   Diverticulosis 08/31/2012   Essential hypertension 02/18/2009    ONSET DATE: 03/31/2023 (referral date); 03/20/2023 onset date  REFERRING DIAG: R29.898 (ICD-10-CM) - Right arm weakness  THERAPY DIAG:  Other lack of coordination  Muscle weakness (generalized)  Right arm weakness  Stiffness of right shoulder, not elsewhere classified  Rationale for Evaluation and Treatment: Rehabilitation  SUBJECTIVE:   SUBJECTIVE STATEMENT:  Pt goes by "Ethan Boyd."   He was able to drive for a couple of hours to the beach but he had to switch with his wife as his R shoulder became stiff. His wife likes to work out and is very [employer] training focused. He typically cleans cabinets on Mondays but has been using his LUE since the stroke. He was unable to hold fishing pole as needed to fish while at the beach.   Pt accompanied by: self   PERTINENT HISTORY: HTN, COPD, insulin-dependent T2DM, HLD MRI brain was revealing for acute infarct of left pons during hospitalization in March. Repeat MRI brain 03/29/23 - IMPRESSION: Interval evolution of the now subacute infarct in the left paramedian pons. No new acute infarct or intracranial hemorrhage.  PRECAUTIONS: Fall  WEIGHT BEARING RESTRICTIONS: No  PAIN:  Are you having pain? No  FALLS: Has patient fallen in last 6 months? No  LIVING ENVIRONMENT: Lives with: lives with their spouse Lives in: House/apartment - Two level house with bedroom and bathroom downstairs Stairs: Yes: Internal: 7 steps to landing then 7 more steps; left hand rail for 7 steps, then handrail on both sides for second set and External: 4 steps; on left going up Has following equipment at home: Single point cane, Walker - 2 wheeled, Wheelchair (transport), and Grab bars Bathroom setup: upstairs bathroom  walk-in shower with small threshold with grab bars and built-in shower seat. Regular height commode  PLOF:  ADLs independent, IADLs independent for what patient was responsible for, driving, retired Chief Operating Officer), patient has two adult children ([name] and [name]) , enjoys golfing, ride around in truck to visit friends  PATIENT GOALS: Pt wants to be "back to where I was in February" with no limitations/weakness.   OBJECTIVE:   HAND DOMINANCE: Right, however patient using left hand recently  ADLs:  Eating: Difficulty cutting food, mild difficulty holding cups pending the weight (drinks from [facility] with straw to prevent spilling), slower to not spill Grooming: Brushing teeth with left mostly, slow on the right, not using q-tips currently UB Dressing: Difficulty with buttons, but otherwise independent LB Dressing: Independent Toileting: Independent, mostly using left hand for wiping Bathing: independent Tub Shower transfers: Independent Equipment: Grab bars and Walk in shower  IADLs: Shopping: Wife now doing grocery shopping where as pt would before. Concern for fatigue, balance, and overhead reaching Light housekeeping: Wife performing, but pt would share responsibility prior (goal to fold clothes) Meal Prep: Wife did most of the cooking before. Does not want to address Community mobility: Utilizing SPC 70% of the time. Pt does not use in the house, but if he leaves the house at all. Medication management: Independent, now have pop up tops.  Financial management: Independent Handwriting: 75% legible, improved gripping of pen as well per his report.  MOBILITY STATUS: Independent with SPC  POSTURE COMMENTS:  No Significant postural limitations Sitting balance:  Independent  ACTIVITY TOLERANCE: Activity tolerance: Pt reports he would force himself to get through every aisle of the grocery store, but it would be difficult.   FUNCTIONAL OUTCOME MEASURES: FOTO: Completed 4/23 score  61  UPPER EXTREMITY ROM:    Active ROM Right eval Left eval  Shoulder flexion 100* WFL  Shoulder abduction 110* WFL  Shoulder adduction    Shoulder extension    Shoulder internal rotation Hip ht [facility]  Shoulder external rotation [facility]  Elbow flexion [facility] WFL  Elbow extension [facility] WFL  Wrist flexion 40* WFL  Wrist extension [facility] WFL  Wrist ulnar deviation    Wrist radial deviation    Wrist pronation [facility] WFL  Wrist supination WFL WFL  (Blank rows = not tested)  Grasp slightly worse of right compared to left  UPPER EXTREMITY MMT:     MMT Right eval Left eval  Shoulder flexion 4-   Shoulder abduction    Shoulder adduction    Shoulder extension 4-   Shoulder internal rotation    Shoulder external rotation    Middle trapezius    Lower trapezius    Elbow flexion 4-   Elbow extension 4-   Wrist flexion    Wrist extension    Wrist ulnar deviation    Wrist radial deviation    Wrist pronation    Wrist supination    (Blank rows = not tested)  Grasp 3+ right hand  HAND  FUNCTION: Grip strength: Right: 40.7 lbs; Left: 79.3 lbs and Lateral pinch: Right: 8 lbs, Left: 10 lbs -04/28/23 3-pt pinch: Right 6lbs  COORDINATION: 9 Hole Peg test: Right: 85 sec; Left: 29 sec Box and Blocks:  Right 45 blocks, Left 52 blocks - 04/28/23  SENSATION: WFL per patient report; however, may benefit from stereognosis  EDEMA: None  MUSCLE TONE: Appears to be WFL  COGNITION: Overall cognitive status: Within functional limits for tasks assessed  VISION: Subjective report: No vision changes noted Baseline vision: No visual deficits  VISION ASSESSMENT: Not tested  Patient has difficulty with following activities due to following visual impairments: None  PERCEPTION: WFL  PRAXIS: WFL  OBSERVATIONS: Pt able to walk from lobby area to table utilizing SPC. No loss of balance. Pt frequently utilizing left hand over right, although notes being right handed. Pt well kept and has supportive  spouse.  04/28/23: Pt ambulatory with SPC, no loss of balance, appears to hike hip a bit. Pt well-kept. Wife present in lobby, but did not join patient.  05/10/13: Similar appearance with ambulation SPC. 05/19/23: No changes 05/23/23: Pt ambulatory without device today.   TODAY'S TREATMENT:                                                                                                                              DATE:   Reviewed HEP from previous session. Initiated HEP as noted in pt instructions for improved RUE strength and ROM  OT had pt complete counter and cabinet scrubs for proprioceptive input through RUE and improved ROM.   Pt completed wall push-ups for improved ROM and proprioceptive input.   PATIENT EDUCATION: Education details: RUE HEP Person educated: Patient Education method: Explanation, Demonstration, Tactile cues, Verbal cues, and Handouts Education comprehension: verbalized understanding, returned demonstration, and verbal cues required  HOME EXERCISE PROGRAM: 04/28/23: Access Code: [email or web address] URL: [email or web address] Date: 04/28/2023 Prepared by: Madison Reep  05/11/23: Access Code: [email or web address] URL: [email or web address]  05/12/23: None 05/16/23: None 05/19/23: Access Code: [email or web address] URL: [email or web address]  GOALS: Goals reviewed with patient? Yes  SHORT TERM GOALS: Target date: 05/16/2023   Pt will demonstrate improved ease with fastening buttons as evidenced by decreasing 3 button/unbutton time by TBD seconds. Baseline: Wife assists with polo shirts, need to address time it takes to complete Goal status: IN PROGRESS  2.  Pt will demonstrate improved fine motor coordination for ADLs as evidenced by decreasing 9 hole peg test score for Rt hand by 10 secs. Baseline: Rt - 85 sec 05/23/23: Rt - 32 secs Goal status: MET  3.  Pt will improve Rt grip strength by >/=20 lbs next visit as needed for daily activity. Baseline:  Will test next visit 04/28/23: Rt 40.7 lbs 05/23/23: Rt 51.8 lbs Goal status: REVISED  4.  Pt will improve Rt shoulder flexion AROM to 120* required for overhead reaching tasks. Baseline: Rt shoulder flexion 100* 05/23/23:  Goal status: IN PROGRESS  5.  Pt  will improve handwriting legibility to 100% in order to write checks. Baseline: 75% legible Goal status: IN PROGRESS  6.  Pt will complete functional activity task in standing for up to 10 minutes without therapeutic rest break to simulate shopping tasks. Baseline: Unable currently for grocery shopping Goal status: IN PROGRESS  LONG TERM GOALS: Target date: 06/13/2023  Pt will complete FOTO assessment at time of discharge scoring 73 or greater indicating functional progression with ADL and IADL completion. Baseline: 61 Goal status: IN PROGRESS  2.  Pt will demonstrate improved fine motor coordination for ADLs as evidenced by decreasing 9 hole peg test score for Rt hand by 25 secs Baseline: Rt - 85 sec 05/23/23: Rt - 32 secs Goal status: MET  3.  Pt will be able to place at least 50 blocks using Rt hand with completion of Box and Blocks test. Baseline: Will test next visit 04/28/23: Rt 45 blocks 05/23/23: Rt - 45 Goal status: IN PROGRESS  4.  Pt will improve Rt shoulder flexion AROM to 140* required for overhead reaching tasks. Baseline: Rt shoulder flexion 100* Goal status: IN PROGRESS  5.  Pt will be independent with HEP. Baseline:  Goal status: IN PROGRESS  6.  Pt will fold and put away basket of clothes independently. Baseline: Unable currently Goal status: IN PROGRESS  ASSESSMENT:  CLINICAL IMPRESSION: Pt demonstrates good motivation and understanding of HEP as needed to progress towards goals as anticipated. Currently most limited functionally by R shoulder ROM and strength.   PERFORMANCE DEFICITS: in functional skills including ADLs, IADLs, coordination, dexterity, sensation, ROM, strength, Fine motor control, Gross  motor control, mobility, balance, body mechanics, endurance, and UE functional use, and psychosocial skills including interpersonal interactions and routines and behaviors.   IMPAIRMENTS: are limiting patient from ADLs, IADLs, leisure, social participation, and driving .   CO-MORBIDITIES: may have co-morbidities  that affects occupational performance. Patient will benefit from skilled OT to address above impairments and improve overall function.  REHAB POTENTIAL: Good   PLAN:  OT FREQUENCY: 2x/week  OT DURATION: 8 weeks  PLANNED INTERVENTIONS: self care/ADL training, therapeutic exercise, therapeutic activity, neuromuscular re-education, passive range of motion, gait training, balance training, functional mobility training, electrical stimulation, ultrasound, moist heat, cryotherapy, patient/family education, cognitive remediation/compensation, visual/perceptual remediation/compensation, psychosocial skills training, energy conservation, coping strategies training, DME and/or AE instructions, and Re-evaluation  RECOMMENDED OTHER SERVICES: None  CONSULTED AND AGREED WITH PLAN OF CARE: Patient and family member/caregiver  PLAN FOR NEXT SESSION: Review HEP as needed; weighted clothes pin, consider stirring of materials (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

## 2023-06-02 ENCOUNTER — Ambulatory Visit: Payer: Medicare Other | Admitting: Occupational Therapy

## 2023-06-02 ENCOUNTER — Ambulatory Visit: Payer: Medicare Other | Admitting: Physical Therapy

## 2023-06-02 DIAGNOSIS — M25611 Stiffness of right shoulder, not elsewhere classified: Secondary | ICD-10-CM

## 2023-06-02 DIAGNOSIS — R29898 Other symptoms and signs involving the musculoskeletal system: Secondary | ICD-10-CM

## 2023-06-02 DIAGNOSIS — R2681 Unsteadiness on feet: Secondary | ICD-10-CM

## 2023-06-02 DIAGNOSIS — R278 Other lack of coordination: Secondary | ICD-10-CM

## 2023-06-02 DIAGNOSIS — R262 Difficulty in walking, not elsewhere classified: Secondary | ICD-10-CM

## 2023-06-02 DIAGNOSIS — M6281 Muscle weakness (generalized): Secondary | ICD-10-CM

## 2023-06-02 DIAGNOSIS — R2689 Other abnormalities of gait and mobility: Secondary | ICD-10-CM

## 2023-06-02 NOTE — Therapy (Signed)
OUTPATIENT PHYSICAL THERAPY NEURO TREATMENT   Patient Name: Shaikh Zobell MRN: [id number] DOB:07-28-1956, 67 y.o., male Today's Date: 06/02/2023   PCP: Oliver Barre, MD REFERRING PROVIDER: Oliver Barre, MD   END OF SESSION:  PT End of Session - 06/02/23 1231     Visit Number 15    Number of Visits 17    Date for PT Re-Evaluation 06/09/23    Authorization Type BCBS    PT Start Time 1230    PT Stop Time 1315    PT Time Calculation (min) 45 min    Equipment Utilized During Treatment Gait belt    Activity Tolerance Patient tolerated treatment well    Behavior During Therapy WFL for tasks assessed/performed                Past Medical History:  Diagnosis Date   DIABETES MELLITUS, TYPE II 02/18/2009   Qualifier: Diagnosis of  By: Jonny Ruiz MD, Len Blalock    Diverticulosis 08/31/2012   Colonoscopy, June 10, 2009, Dr Perry/GI   ERECTILE DYSFUNCTION 02/18/2009   Qualifier: Diagnosis of  By: Jonny Ruiz MD, Len Blalock    Erectile dysfunction 09/07/2012   HYPERLIPIDEMIA 02/18/2009   Qualifier: Diagnosis of  By: Jonny Ruiz MD, Len Blalock    HYPERTENSION 02/18/2009   Qualifier: Diagnosis of  By: Jonny Ruiz MD, Len Blalock    Past Surgical History:  Procedure Laterality Date   COLONOSCOPY  06/10/2009   right knee cartilage  12/26/1974   Patient Active Problem List   Diagnosis Date Noted   Acute stroke due to ischemia (HCC) 03/28/2023   Right foot drop 03/28/2023   CVA (cerebral vascular accident) (HCC) 03/19/2023   Vitamin D deficiency 07/31/2022   COPD (chronic obstructive pulmonary disease) (HCC) 07/29/2022   Pulmonary nodule, left 08/09/2021   Abnormal physical evaluation 07/15/2021   Screen for colon cancer 07/15/2021   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   Type II diabetes mellitus with manifestations (HCC) 07/15/2021   Hyperlipidemia LDL goal <70 07/15/2021   Tachycardia 07/15/2021   PVC (premature ventricular contraction) 07/15/2021   Insulin dependent type 2 diabetes mellitus (HCC) 07/15/2021    Lung nodule 07/15/2021   EKG abnormality 09/07/2012   Erectile dysfunction 09/07/2012   Snoring 09/07/2012   Encounter for well adult exam with abnormal findings 08/31/2012   Diverticulosis 08/31/2012   Essential hypertension 02/18/2009    ONSET DATE: 03/19/23  REFERRING DIAG:  I63.9 (ICD-10-CM) - Acute stroke due to ischemia  M21.371 (ICD-10-CM) - Right foot drop    THERAPY DIAG:  Muscle weakness (generalized)  Unsteadiness on feet  Difficulty in walking, not elsewhere classified  Other abnormalities of gait and mobility  Rationale for Evaluation and Treatment: Rehabilitation  SUBJECTIVE:  SUBJECTIVE STATEMENT: Pt goes by "[name]"  Pt reports no acute changes since last visit, no falls, no pain. Pt brings in his foot up brace today that he purchased, asking for assistance setting it up in his tennis shoe. Pt also reports reports ongoing swelling his distal RLE. Pt doing towel scrunches with his R foot but cannot pick up golf ball yet.  Pt reports that he plans to wear the foot up brace for 2-3 months to see how his strength improves with plan to either wean off of the brace or get an AFO if needed depending on his recovery.  Pt states a goal to get back to grocery shopping and get back to being able to bag groceries independently.  Pt accompanied by: self (driving himself to PT now)  PERTINENT HISTORY: HTN, COPD, insulin-dependent T2DM, HLD   PAIN:  Are you having pain? No  PRECAUTIONS: Fall  PATIENT GOALS: "walk normal"   OBJECTIVE:   DIAGNOSTIC FINDINGS: 03/29/23 brain MRI:  IMPRESSION: Interval evolution of the now subacute infarct in the left paramedian pons. No new acute infarct or intracranial hemorrhage.  TODAY'S TREATMENT:                       TherAct: Setup foot up  brace on patient's R shoe. After gait assessment with brace (see below) encouraged pt to try walking at home across different surfaces (grass, uneven ground, etc.) with his foot-up brace.  NMR: 6 Blaze pods on random setting for improved SLS stability and RLE NMR.  Performed on 1 minute intervals with 30 sec rest periods.  Pt requires CGA to min A guarding with several LOB that he is able to recover with min A. Round 1:  circle configuration setup.  13 hits. Round 2:  semi-circle configuration with stance on airex setup.  26 hits. Round 3:  semi-circle configuration with stance on airex setup.  26 hits. Notable errors/deficits:  decreased SLS stability when standing on RLE and when having to reach outside BOS with LLE   Gait: Gait pattern:  vaulting on LLE, decreased hip/knee flexion- Right, and decreased ankle dorsiflexion- Right Distance walked: 345 ft Assistive device utilized: None Level of assistance: Modified independence Comments: trial gait with R foot up brace; pt does exhibit improved limb clearance but stil has flat foot and uses hip to advance limb, with cues for R heel strike exhibits improved gait mechanics  Unlocked foot up brace and had pt work on heel strike and he exhibits improved gait as compared to first lap but does have decreased DF with onset of fatigue   PATIENT EDUCATION: Education details: continue HEP, educated pt that therapy recommends toe cap along with foot up brace as he does not want to get an AFO right now, recommend trialing gait over grass at home with foot-up brace Person educated: Patient Education method: Explanation and Demonstration Education comprehension: verbalized understanding and needs further education  HOME EXERCISE PROGRAM: Access Code: [email or web address] URL: [email or web address] Date: 04/03/2023 Prepared by: Peter Congo  Exercises - Alternating Step Taps with Counter Support  - 1 x daily - 7 x weekly - 3 sets - 10 reps -  Staggered Sit-to-Stand  - 1 x daily - 7 x weekly - 3 sets - 10 reps -Left arm supported on hand rail at stairs: lift your R foot onto the 1st step tapping your heel onto the step   -think "up and on" then "up and back" -lifting R leg up and  over dumbbell or can of soup from a seated position   -emphasis on up and over and not around - Modified [employer]  - 1 x daily - 7 x weekly - 3 sets - 30-45s hold -standing lifting R toes off the floor  - Standing Knee Flexion AROM with Chair Support  - 1 x daily - 7 x weekly - 3 sets - 10 reps   GOALS: Goals reviewed with patient? Yes  SHORT TERM GOALS: Target date: 04/28/23  Pt will be independent with initial HEP for improved gait mechanics  Baseline: to be provided Goal status: MET  2.  Pt will improve gait speed to >/= .18m/s to demonstrate improved community ambulation  Baseline: .53m/s, 1.28m/s (5/3) Goal status: MET  3.  Pt will improve 5x STS to </= 13 sec to demo improved functional LE strength and balance   Baseline: 15.22s B UE, 11.56 sec no UE (5/3) Goal status: MET  4.  Pt will improve Berg score to 42/56 for decreased fall risk   Baseline: 38/56 (4/8), 48/56 (5/3) Goal status: MET   LONG TERM GOALS: Target date: 06/09/23  Pt will be independent with final HEP for improved gait mechanics  Baseline: to be provided  Goal status: INITIAL  2.  Pt will improve gait speed to >/= 1.1 m/s to demonstrate improved community ambulation  Baseline: .11m/s, 1.61m/s (5/3) Goal status: REVISED  3.  Pt will improve 5x STS to </= 10 sec to demo improved functional LE strength and balance   Baseline: 15.22s B UE, 11.56 sec no UE (5/3) Goal status: INITIAL  4.  Pt will improve Berg score to 45/56 for decreased fall risk  Baseline: 38/56 (4/8), 48/56 (5/3) Goal status: MET  5.  Patient will improve FOTO score to >/= 69 to demonstrate functional improvement Baseline: 52, 68.74 (5/3) Goal status: INITIAL  ASSESSMENT:  CLINICAL  IMPRESSION: Emphasis of skilled PT session on setting pt up with his foot up brace and assessing gait with and without use of brace. Pt does exhibit improved gait mechanics and RLE clearance with use of brace but also needs cues to focus on R heel strike during gait. Remainder of session focus on SLS stability on compliant surface. Pt continues to exhibit decreased stability with stance on his RLE with several LOB requiring min A to recover. Pt continues to benefit from skilled therapy services to work towards improving his RLE strength and control in order to return to a more functional and normalized gait pattern. Continue POC.    OBJECTIVE IMPAIRMENTS: Abnormal gait, decreased balance, decreased coordination, decreased endurance, decreased knowledge of condition, decreased knowledge of use of DME, decreased strength, impaired tone, and impaired UE functional use.   ACTIVITY LIMITATIONS: carrying, lifting, standing, squatting, stairs, transfers, locomotion level, and caring for others  PARTICIPATION LIMITATIONS: meal prep, cleaning, interpersonal relationship, driving, shopping, community activity, occupation, and yard work  PERSONAL FACTORS: Age, Fitness, Past/current experiences, Time since onset of injury/illness/exacerbation, Transportation, and 3+ comorbidities: see above  are also affecting patient's functional outcome.   REHAB POTENTIAL: Good  CLINICAL DECISION MAKING: Stable/uncomplicated  EVALUATION COMPLEXITY: Low  PLAN:  PT FREQUENCY: 2x/week  PT DURATION: 8 weeks  PLANNED INTERVENTIONS: Therapeutic exercises, Therapeutic activity, Neuromuscular re-education, Balance training, Gait training, Patient/Family education, Self Care, Joint mobilization, Stair training, Vestibular training, Canalith repositioning, Visual/preceptual remediation/compensation, Orthotic/Fit training, DME instructions, Aquatic Therapy, Dry Needling, Electrical stimulation, Taping, Manual therapy, and  Re-evaluation  PLAN FOR NEXT SESSION: add to HEP, gait  with SPC, minimizing compensatory strategies, Quadruped, anything moving/strengthening outside of R LE extensor tone, resisted gait?, ankle strategy, stepping strategy, BOSU ball toss, some therex is too easy so not doing those anymore (4" step too easy, feels like step ups and downs on curbs are easy) so UPGRADE HEP   Peter Congo, PT, DPT, CSRS 06/02/2023, 1:17 PM

## 2023-06-02 NOTE — Therapy (Signed)
OUTPATIENT OCCUPATIONAL THERAPY NEURO TREATMENT  Patient Name: Jefery Skrzypek MRN: [id number] DOB:04-Jul-1956, 67 y.o., male Today's Date: 06/02/2023  PCP: Corwin Levins, MD REFERRING PROVIDER: Corwin Levins, MD  END OF SESSION:  OT End of Session - 06/02/23 1321     Visit Number 9    Number of Visits 16    Authorization Type Medicare A&B / BCBS    OT Start Time 1322    OT Stop Time 1404    OT Time Calculation (min) 42 min    Equipment Utilized During Treatment green theraputty, 2lb and 3lb dumbbells    Activity Tolerance Patient tolerated treatment well    Behavior During Therapy WFL for tasks assessed/performed             Past Medical History:  Diagnosis Date   DIABETES MELLITUS, TYPE II 02/18/2009   Qualifier: Diagnosis of  By: Jonny Ruiz MD, Len Blalock    Diverticulosis 08/31/2012   Colonoscopy, June 10, 2009, Dr Perry/GI   ERECTILE DYSFUNCTION 02/18/2009   Qualifier: Diagnosis of  By: Jonny Ruiz MD, Len Blalock    Erectile dysfunction 09/07/2012   HYPERLIPIDEMIA 02/18/2009   Qualifier: Diagnosis of  By: Jonny Ruiz MD, Len Blalock    HYPERTENSION 02/18/2009   Qualifier: Diagnosis of  By: Jonny Ruiz MD, Len Blalock    Past Surgical History:  Procedure Laterality Date   COLONOSCOPY  06/10/2009   right knee cartilage  12/26/1974   Patient Active Problem List   Diagnosis Date Noted   Acute stroke due to ischemia (HCC) 03/28/2023   Right foot drop 03/28/2023   CVA (cerebral vascular accident) (HCC) 03/19/2023   Vitamin D deficiency 07/31/2022   COPD (chronic obstructive pulmonary disease) (HCC) 07/29/2022   Pulmonary nodule, left 08/09/2021   Abnormal physical evaluation 07/15/2021   Screen for colon cancer 07/15/2021   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   Type II diabetes mellitus with manifestations (HCC) 07/15/2021   Hyperlipidemia LDL goal <70 07/15/2021   Tachycardia 07/15/2021   PVC (premature ventricular contraction) 07/15/2021   Insulin dependent type 2 diabetes mellitus (HCC) 07/15/2021    Lung nodule 07/15/2021   EKG abnormality 09/07/2012   Erectile dysfunction 09/07/2012   Snoring 09/07/2012   Encounter for well adult exam with abnormal findings 08/31/2012   Diverticulosis 08/31/2012   Essential hypertension 02/18/2009    ONSET DATE: 03/31/2023 (referral date); 03/20/2023 onset date  REFERRING DIAG: R29.898 (ICD-10-CM) - Right arm weakness  THERAPY DIAG:  Other lack of coordination  Muscle weakness (generalized)  Right arm weakness  Stiffness of right shoulder, not elsewhere classified  Rationale for Evaluation and Treatment: Rehabilitation  SUBJECTIVE:   SUBJECTIVE STATEMENT:  Pt goes by "[name]."   He had some brief R shoulder pain while throwing his towel behind his back after his shower this PM.    Pt accompanied by: self   PERTINENT HISTORY: HTN, COPD, insulin-dependent T2DM, HLD MRI brain was revealing for acute infarct of left pons during hospitalization in March. Repeat MRI brain 03/29/23 - IMPRESSION: Interval evolution of the now subacute infarct in the left paramedian pons. No new acute infarct or intracranial hemorrhage.  PRECAUTIONS: Fall  WEIGHT BEARING RESTRICTIONS: No  PAIN:  Are you having pain? No  FALLS: Has patient fallen in last 6 months? No  LIVING ENVIRONMENT: Lives with: lives with their spouse Lives in: House/apartment - Two level house with bedroom and bathroom downstairs Stairs: Yes: Internal: 7 steps to landing then 7 more steps; left hand rail for 7 steps, then  handrail on both sides for second set and External: 4 steps; on left going up Has following equipment at home: Single point cane, Walker - 2 wheeled, Wheelchair (transport), and Grab bars Bathroom setup: upstairs bathroom walk-in shower with small threshold with grab bars and built-in shower seat. Regular height commode  PLOF:  ADLs independent, IADLs independent for what patient was responsible for, driving, retired Chief Operating Officer), patient has two adult  children ([name] and [name]) , enjoys golfing, ride around in truck to visit friends  PATIENT GOALS: Pt wants to be "back to where I was in February" with no limitations/weakness.   OBJECTIVE:   HAND DOMINANCE: Right, however patient using left hand recently  ADLs:  Eating: Difficulty cutting food, mild difficulty holding cups pending the weight (drinks from [facility] with straw to prevent spilling), slower to not spill Grooming: Brushing teeth with left mostly, slow on the right, not using q-tips currently UB Dressing: Difficulty with buttons, but otherwise independent LB Dressing: Independent Toileting: Independent, mostly using left hand for wiping Bathing: independent Tub Shower transfers: Independent Equipment: Grab bars and Walk in shower  IADLs: Shopping: Wife now doing grocery shopping where as pt would before. Concern for fatigue, balance, and overhead reaching Light housekeeping: Wife performing, but pt would share responsibility prior (goal to fold clothes) Meal Prep: Wife did most of the cooking before. Does not want to address Community mobility: Utilizing SPC 70% of the time. Pt does not use in the house, but if he leaves the house at all. Medication management: Independent, now have pop up tops.  Financial management: Independent Handwriting: 75% legible, improved gripping of pen as well per his report.  MOBILITY STATUS: Independent with SPC  POSTURE COMMENTS:  No Significant postural limitations Sitting balance:  Independent  ACTIVITY TOLERANCE: Activity tolerance: Pt reports he would force himself to get through every aisle of the grocery store, but it would be difficult.   FUNCTIONAL OUTCOME MEASURES: FOTO: Completed 4/23 score 61  UPPER EXTREMITY ROM:    Active ROM Right eval Left eval  Shoulder flexion 100* WFL  Shoulder abduction 110* WFL  Shoulder adduction    Shoulder extension    Shoulder internal rotation Hip [facility]  Shoulder external rotation [facility]   Elbow flexion [facility] WFL  Elbow extension [facility] WFL  Wrist flexion 40* WFL  Wrist extension [facility] WFL  Wrist ulnar deviation    Wrist radial deviation    Wrist pronation [facility] WFL  Wrist supination WFL WFL  (Blank rows = not tested)  Grasp slightly worse of right compared to left  UPPER EXTREMITY MMT:     MMT Right eval Left eval  Shoulder flexion 4-   Shoulder abduction    Shoulder adduction    Shoulder extension 4-   Shoulder internal rotation    Shoulder external rotation    Middle trapezius    Lower trapezius    Elbow flexion 4-   Elbow extension 4-   Wrist flexion    Wrist extension    Wrist ulnar deviation    Wrist radial deviation    Wrist pronation    Wrist supination    (Blank rows = not tested)  Grasp 3+ right hand  HAND FUNCTION: Grip strength: Right: 40.7 lbs; Left: 79.3 lbs and Lateral pinch: Right: 8 lbs, Left: 10 lbs -04/28/23 3-pt pinch: Right 6lbs  COORDINATION: 9 Hole Peg test: Right: 85 sec; Left: 29 sec Box and Blocks:  Right 45 blocks, Left 52 blocks - 04/28/23  SENSATION: WFL per patient report; however, may benefit from stereognosis  EDEMA: None  MUSCLE TONE: Appears to be WFL  COGNITION: Overall cognitive status: Within functional limits for tasks assessed  VISION: Subjective report: No vision changes noted Baseline vision: No visual deficits  VISION ASSESSMENT: Not tested  Patient has difficulty with following activities due to following visual impairments: None  PERCEPTION: WFL  PRAXIS: WFL  OBSERVATIONS: Pt able to walk from lobby area to table utilizing SPC. No loss of balance. Pt frequently utilizing left hand over right, although notes being right handed. Pt well kept and has supportive spouse.  04/28/23: Pt ambulatory with SPC, no loss of balance, appears to hike hip a bit. Pt well-kept. Wife present in lobby, but did not join patient.  05/10/13: Similar appearance with ambulation SPC. 05/19/23: No changes 05/23/23: Pt  ambulatory without device today.   TODAY'S TREATMENT:                                                                                                                              DATE:   Reviewed HEP from previous session requiring mod cueing. Initiated HEP as noted in pt instructions  including behind back and head side to side towel pulls for improved RUE strength and ROM  Placement and removal of yellow, red, green, blue, and black resistive clips with use of right 3 point pinch for strengthening of affected extremity. Pt able to place yellow, red, green, blue, and black clips vertically for additional challenge to shoulder ROM. Cues to avoid shoulder hike  PATIENT EDUCATION: Education details: RUE HEP; R strengthening Person educated: Patient Education method: Explanation, Demonstration, Tactile cues, Verbal cues, and Handouts Education comprehension: verbalized understanding, returned demonstration, and verbal cues required  HOME EXERCISE PROGRAM: 04/28/23: Access Code: [email or web address] URL: [email or web address] Date: 04/28/2023 Prepared by: Madison Reep  05/11/23: Access Code: [email or web address] URL: [email or web address]  05/12/23: None 05/16/23: None 05/19/23: Access Code: [email or web address] URL: [email or web address]  GOALS: Goals reviewed with patient? Yes  SHORT TERM GOALS: Target date: 05/16/2023   Pt will demonstrate improved ease with fastening buttons as evidenced by decreasing 3 button/unbutton time by TBD seconds. Baseline: Wife assists with polo shirts, need to address time it takes to complete Goal status: IN PROGRESS  2.  Pt will demonstrate improved fine motor coordination for ADLs as evidenced by decreasing 9 hole peg test score for Rt hand by 10 secs. Baseline: Rt - 85 sec 05/23/23: Rt - 32 secs Goal status: MET  3.  Pt will improve Rt grip strength by >/=20 lbs next visit as needed for daily activity. Baseline: Will test next visit 04/28/23:  Rt 40.7 lbs 05/23/23: Rt 51.8 lbs Goal status: REVISED  4.  Pt will improve Rt shoulder flexion AROM to 120* required for overhead reaching tasks. Baseline: Rt shoulder flexion 100* 05/23/23:  Goal status: IN PROGRESS  5.  Pt will improve handwriting legibility to 100% in order to write checks. Baseline: 75% legible  Goal status: IN PROGRESS  6.  Pt will complete functional activity task in standing for up to 10 minutes without therapeutic rest break to simulate shopping tasks. Baseline: Unable currently for grocery shopping Goal status: IN PROGRESS  LONG TERM GOALS: Target date: 06/13/2023  Pt will complete FOTO assessment at time of discharge scoring 73 or greater indicating functional progression with ADL and IADL completion. Baseline: 61 Goal status: IN PROGRESS  2.  Pt will demonstrate improved fine motor coordination for ADLs as evidenced by decreasing 9 hole peg test score for Rt hand by 25 secs Baseline: Rt - 85 sec 05/23/23: Rt - 32 secs Goal status: MET  3.  Pt will be able to place at least 50 blocks using Rt hand with completion of Box and Blocks test. Baseline: Will test next visit 04/28/23: Rt 45 blocks 05/23/23: Rt - 45 Goal status: IN PROGRESS  4.  Pt will improve Rt shoulder flexion AROM to 140* required for overhead reaching tasks. Baseline: Rt shoulder flexion 100* Goal status: IN PROGRESS  5.  Pt will be independent with HEP. Baseline:  Goal status: IN PROGRESS  6.  Pt will fold and put away basket of clothes independently. Baseline: Unable currently Goal status: IN PROGRESS  ASSESSMENT:  CLINICAL IMPRESSION: Pt demonstrates good motivation and understanding of HEP as needed to progress towards goals as anticipated. Currently most limited functionally by R shoulder ROM and strength. Believe reported brief R shoulder pain to be due to pinched nerve. Will continue to monitor.   PERFORMANCE DEFICITS: in functional skills including ADLs, IADLs, coordination,  dexterity, sensation, ROM, strength, Fine motor control, Gross motor control, mobility, balance, body mechanics, endurance, and UE functional use, and psychosocial skills including interpersonal interactions and routines and behaviors.   IMPAIRMENTS: are limiting patient from ADLs, IADLs, leisure, social participation, and driving .   CO-MORBIDITIES: may have co-morbidities  that affects occupational performance. Patient will benefit from skilled OT to address above impairments and improve overall function.  REHAB POTENTIAL: Good   PLAN:  OT FREQUENCY: 2x/week  OT DURATION: 8 weeks  PLANNED INTERVENTIONS: self care/ADL training, therapeutic exercise, therapeutic activity, neuromuscular re-education, passive range of motion, gait training, balance training, functional mobility training, electrical stimulation, ultrasound, moist heat, cryotherapy, patient/family education, cognitive remediation/compensation, visual/perceptual remediation/compensation, psychosocial skills training, energy conservation, coping strategies training, DME and/or AE instructions, and Re-evaluation  RECOMMENDED OTHER SERVICES: None  CONSULTED AND AGREED WITH PLAN OF CARE: Patient and family member/caregiver  PLAN FOR NEXT SESSION: Review HEP as needed; consider stirring of materials (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

## 2023-06-05 ENCOUNTER — Ambulatory Visit: Payer: Medicare Other | Admitting: Physical Therapy

## 2023-06-05 ENCOUNTER — Ambulatory Visit: Payer: Medicare Other | Admitting: Occupational Therapy

## 2023-06-05 DIAGNOSIS — R278 Other lack of coordination: Secondary | ICD-10-CM | POA: Diagnosis not present

## 2023-06-05 DIAGNOSIS — R2689 Other abnormalities of gait and mobility: Secondary | ICD-10-CM

## 2023-06-05 DIAGNOSIS — R29898 Other symptoms and signs involving the musculoskeletal system: Secondary | ICD-10-CM

## 2023-06-05 DIAGNOSIS — M25611 Stiffness of right shoulder, not elsewhere classified: Secondary | ICD-10-CM

## 2023-06-05 DIAGNOSIS — R2681 Unsteadiness on feet: Secondary | ICD-10-CM

## 2023-06-05 DIAGNOSIS — R262 Difficulty in walking, not elsewhere classified: Secondary | ICD-10-CM

## 2023-06-05 DIAGNOSIS — M6281 Muscle weakness (generalized): Secondary | ICD-10-CM

## 2023-06-05 NOTE — Therapy (Signed)
OUTPATIENT PHYSICAL THERAPY NEURO TREATMENT   Patient Name: Ethan Boyd MRN: [id number] DOB:12/09/1956, 67 y.o., male Today's Date: 06/05/2023   PCP: Oliver Barre, MD REFERRING PROVIDER: Oliver Barre, MD   END OF SESSION:  PT End of Session - 06/05/23 1146     Visit Number 16    Number of Visits 17    Date for PT Re-Evaluation 06/09/23    Authorization Type BCBS    PT Start Time 1145    PT Stop Time 1228    PT Time Calculation (min) 43 min    Equipment Utilized During Treatment Gait belt    Activity Tolerance Patient tolerated treatment well    Behavior During Therapy WFL for tasks assessed/performed                 Past Medical History:  Diagnosis Date   DIABETES MELLITUS, TYPE II 02/18/2009   Qualifier: Diagnosis of  By: Jonny Ruiz MD, Len Blalock    Diverticulosis 08/31/2012   Colonoscopy, June 10, 2009, Dr Perry/GI   ERECTILE DYSFUNCTION 02/18/2009   Qualifier: Diagnosis of  By: Jonny Ruiz MD, Len Blalock    Erectile dysfunction 09/07/2012   HYPERLIPIDEMIA 02/18/2009   Qualifier: Diagnosis of  By: Jonny Ruiz MD, Len Blalock    HYPERTENSION 02/18/2009   Qualifier: Diagnosis of  By: Jonny Ruiz MD, Len Blalock    Past Surgical History:  Procedure Laterality Date   COLONOSCOPY  06/10/2009   right knee cartilage  12/26/1974   Patient Active Problem List   Diagnosis Date Noted   Acute stroke due to ischemia (HCC) 03/28/2023   Right foot drop 03/28/2023   CVA (cerebral vascular accident) (HCC) 03/19/2023   Vitamin D deficiency 07/31/2022   COPD (chronic obstructive pulmonary disease) (HCC) 07/29/2022   Pulmonary nodule, left 08/09/2021   Abnormal physical evaluation 07/15/2021   Screen for colon cancer 07/15/2021   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   Type II diabetes mellitus with manifestations (HCC) 07/15/2021   Hyperlipidemia LDL goal <70 07/15/2021   Tachycardia 07/15/2021   PVC (premature ventricular contraction) 07/15/2021   Insulin dependent type 2 diabetes mellitus (HCC) 07/15/2021    Lung nodule 07/15/2021   EKG abnormality 09/07/2012   Erectile dysfunction 09/07/2012   Snoring 09/07/2012   Encounter for well adult exam with abnormal findings 08/31/2012   Diverticulosis 08/31/2012   Essential hypertension 02/18/2009    ONSET DATE: 03/19/23  REFERRING DIAG:  I63.9 (ICD-10-CM) - Acute stroke due to ischemia  M21.371 (ICD-10-CM) - Right foot drop    THERAPY DIAG:  Muscle weakness (generalized)  Other lack of coordination  Unsteadiness on feet  Difficulty in walking, not elsewhere classified  Other abnormalities of gait and mobility  Rationale for Evaluation and Treatment: Rehabilitation  SUBJECTIVE:  SUBJECTIVE STATEMENT: Pt goes by "[name]"  Pt comes into the clinic today not wearing his foot up brace, states he did not have time to put it on due to time constraints. Pt reports he has been taking it in/out of his shoe and only wearing it intermittently because he doesn't like his shoes being tight. Pt educated he can leave shoe lace part in shoe and wear laces loose if not wearing ankle portion of brace.  Pt accompanied by: self (driving himself to PT now)  PERTINENT HISTORY: HTN, COPD, insulin-dependent T2DM, HLD   PAIN:  Are you having pain? No  PRECAUTIONS: Fall  PATIENT GOALS: "walk normal"   OBJECTIVE:   DIAGNOSTIC FINDINGS: 03/29/23 brain MRI:  IMPRESSION: Interval evolution of the now subacute infarct in the left paramedian pons. No new acute infarct or intracranial hemorrhage.  TODAY'S TREATMENT:    TherEx: Discussed current HEP and ways to modify exercises to increase challenge: Staggered sit to stand, bring LLE further forwards to increase amount of stagger and increase WB through RLE Standing HS curls and ankle DF/PF remains challenging Added 5#  ankle weight to lift over dumbbells Added 5# weight to 6" and 12" step-taps Added eccentric heel raises at bottom of stairs  See HEP adjustments bolded below                       Gait: Gait pattern:  vaulting LLE, decreased hip/knee flexion- Right, and decreased ankle dorsiflexion- Right Distance walked: 230 ft, 345 ft Assistive device utilized:  walking poles with therapist assist to work on arm swing Level of assistance: Modified independence Comments: gait while holding onto walking poles with therapist holding poles behind patient to increase arm swing during gait; pt initially with difficulty sequencing arm and LE swing but improves by 2nd lap with more natural pattern noted; continued to have pt focus on R heel strike during gait as well; pt then progresses to gait with no poles but continued focus on arm swing with improved performance noted  PATIENT EDUCATION: Education details: adjusted HEP, work on gait with arm swing Person educated: Patient Education method: Programmer, multimedia, Facilities manager, Actor cues, Verbal cues, and Handouts Education comprehension: verbalized understanding and needs further education  HOME EXERCISE PROGRAM: Access Code: [email or web address] URL: [email or web address] Date: 04/03/2023 Prepared by: Peter Congo  Exercises - Alternating Step Taps with Counter Support  - 1 x daily - 7 x weekly - 3 sets - 10 reps (add 5# ankle weight) - Staggered Sit-to-Stand  - 1 x daily - 7 x weekly - 3 sets - 10 reps (bring LLE further forwards) -Left arm supported on hand rail at stairs: lift your R foot onto the 1st step tapping your heel onto the step   -think "up and on" then "up and back" (add 5# ankle weight) -lifting R leg up and over dumbbell or can of soup from a seated position   -emphasis on up and over and not around (add 5# ankle weight) - Modified Thomas Stretch  - 1 x daily - 7 x weekly - 3 sets - 30-45s hold - Standing lifting R toes off the floor  -  Standing Knee Flexion AROM with Chair Support  - 1 x daily - 7 x weekly - 3 sets - 10 reps - Heel Raises with Counter Support  - 1 x daily - 7 x weekly - 3 sets - 10 reps   GOALS: Goals reviewed with patient? Yes  SHORT TERM GOALS: Target  date: 04/28/23  Pt will be independent with initial HEP for improved gait mechanics  Baseline: to be provided Goal status: MET  2.  Pt will improve gait speed to >/= .28m/s to demonstrate improved community ambulation  Baseline: .19m/s, 1.37m/s (5/3) Goal status: MET  3.  Pt will improve 5x STS to </= 13 sec to demo improved functional LE strength and balance   Baseline: 15.22s B UE, 11.56 sec no UE (5/3) Goal status: MET  4.  Pt will improve Berg score to 42/56 for decreased fall risk   Baseline: 38/56 (4/8), 48/56 (5/3) Goal status: MET   LONG TERM GOALS: Target date: 06/09/23  Pt will be independent with final HEP for improved gait mechanics  Baseline: to be provided  Goal status: INITIAL  2.  Pt will improve gait speed to >/= 1.1 m/s to demonstrate improved community ambulation  Baseline: .54m/s, 1.2m/s (5/3) Goal status: REVISED  3.  Pt will improve 5x STS to </= 10 sec to demo improved functional LE strength and balance   Baseline: 15.22s B UE, 11.56 sec no UE (5/3) Goal status: INITIAL  4.  Pt will improve Berg score to 45/56 for decreased fall risk  Baseline: 38/56 (4/8), 48/56 (5/3) Goal status: MET  5.  Patient will improve FOTO score to >/= 69 to demonstrate functional improvement Baseline: 52, 68.74 (5/3) Goal status: INITIAL  ASSESSMENT:  CLINICAL IMPRESSION: Emphasis of skilled PT session on reviewing HEP and modifying HEP to increase challenge for patient. Pt exhibits good challenge with addition of 5# ankle weight for exercises. Pt also exhibits improved ability to incorporate arm swing into gait following practice with walking poles with good carryover once poles removed. Plan to d/c from OPPT services next  session and initiated this discussion with patient. Continued to encourage pt to wear his foot up brace during gait for improved gait mechanics and decreased compensations. Continue POC.    OBJECTIVE IMPAIRMENTS: Abnormal gait, decreased balance, decreased coordination, decreased endurance, decreased knowledge of condition, decreased knowledge of use of DME, decreased strength, impaired tone, and impaired UE functional use.   ACTIVITY LIMITATIONS: carrying, lifting, standing, squatting, stairs, transfers, locomotion level, and caring for others  PARTICIPATION LIMITATIONS: meal prep, cleaning, interpersonal relationship, driving, shopping, community activity, occupation, and yard work  PERSONAL FACTORS: Age, Fitness, Past/current experiences, Time since onset of injury/illness/exacerbation, Transportation, and 3+ comorbidities: see above  are also affecting patient's functional outcome.   REHAB POTENTIAL: Good  CLINICAL DECISION MAKING: Stable/uncomplicated  EVALUATION COMPLEXITY: Low  PLAN:  PT FREQUENCY: 2x/week  PT DURATION: 8 weeks  PLANNED INTERVENTIONS: Therapeutic exercises, Therapeutic activity, Neuromuscular re-education, Balance training, Gait training, Patient/Family education, Self Care, Joint mobilization, Stair training, Vestibular training, Canalith repositioning, Visual/preceptual remediation/compensation, Orthotic/Fit training, DME instructions, Aquatic Therapy, Dry Needling, Electrical stimulation, Taping, Manual therapy, and Re-evaluation  PLAN FOR NEXT SESSION: assess LTG and d/c from PT   Peter Congo, PT, DPT, CSRS 06/05/2023, 12:29 PM

## 2023-06-05 NOTE — Therapy (Unsigned)
OUTPATIENT OCCUPATIONAL THERAPY NEURO TREATMENT AND PROGRESS NOTE  Patient Name: Ethan Boyd MRN: [id number] DOB:12/13/1956, 67 y.o., male Today's Date: 06/05/2023  PCP: Corwin Levins, MD REFERRING PROVIDER: Corwin Levins, MD  END OF SESSION:  OT End of Session - 06/05/23 1107     Visit Number 10    Number of Visits 16    Authorization Type Medicare A&B / BCBS    Progress Note Due on Visit 16    OT Start Time 1102    OT Stop Time 1143    OT Time Calculation (min) 41 min    Activity Tolerance Patient tolerated treatment well    Behavior During Therapy [facility] for tasks assessed/performed             Past Medical History:  Diagnosis Date   DIABETES MELLITUS, TYPE II 02/18/2009   Qualifier: Diagnosis of  By: Jonny Ruiz MD, Len Blalock    Diverticulosis 08/31/2012   Colonoscopy, June 10, 2009, Dr Perry/GI   ERECTILE DYSFUNCTION 02/18/2009   Qualifier: Diagnosis of  By: Jonny Ruiz MD, Len Blalock    Erectile dysfunction 09/07/2012   HYPERLIPIDEMIA 02/18/2009   Qualifier: Diagnosis of  By: Jonny Ruiz MD, Len Blalock    HYPERTENSION 02/18/2009   Qualifier: Diagnosis of  By: Jonny Ruiz MD, Len Blalock    Past Surgical History:  Procedure Laterality Date   COLONOSCOPY  06/10/2009   right knee cartilage  12/26/1974   Patient Active Problem List   Diagnosis Date Noted   Acute stroke due to ischemia (HCC) 03/28/2023   Right foot drop 03/28/2023   CVA (cerebral vascular accident) (HCC) 03/19/2023   Vitamin D deficiency 07/31/2022   COPD (chronic obstructive pulmonary disease) (HCC) 07/29/2022   Pulmonary nodule, left 08/09/2021   Abnormal physical evaluation 07/15/2021   Screen for colon cancer 07/15/2021   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   Type II diabetes mellitus with manifestations (HCC) 07/15/2021   Hyperlipidemia LDL goal <70 07/15/2021   Tachycardia 07/15/2021   PVC (premature ventricular contraction) 07/15/2021   Insulin dependent type 2 diabetes mellitus (HCC) 07/15/2021   Lung nodule 07/15/2021    EKG abnormality 09/07/2012   Erectile dysfunction 09/07/2012   Snoring 09/07/2012   Encounter for well adult exam with abnormal findings 08/31/2012   Diverticulosis 08/31/2012   Essential hypertension 02/18/2009    ONSET DATE: 03/31/2023 (referral date); 03/20/2023 onset date  REFERRING DIAG: R29.898 (ICD-10-CM) - Right arm weakness  THERAPY DIAG:  Muscle weakness (generalized)  Other lack of coordination  Right arm weakness  Stiffness of right shoulder, not elsewhere classified  Rationale for Evaluation and Treatment: Rehabilitation  SUBJECTIVE:   SUBJECTIVE STATEMENT:  Pt goes by "[name]."   He's been doing well.    Pt accompanied by: self   PERTINENT HISTORY: HTN, COPD, insulin-dependent T2DM, HLD MRI brain was revealing for acute infarct of left pons during hospitalization in March. Repeat MRI brain 03/29/23 - IMPRESSION: Interval evolution of the now subacute infarct in the left paramedian pons. No new acute infarct or intracranial hemorrhage.  PRECAUTIONS: Fall  WEIGHT BEARING RESTRICTIONS: No  PAIN:  Are you having pain? No  FALLS: Has patient fallen in last 6 months? No  LIVING ENVIRONMENT: Lives with: lives with their spouse Lives in: House/apartment - Two level house with bedroom and bathroom downstairs Stairs: Yes: Internal: 7 steps to landing then 7 more steps; left hand rail for 7 steps, then handrail on both sides for second set and External: 4 steps; on left going up Has  following equipment at home: Single point cane, Walker - 2 wheeled, Wheelchair (transport), and Grab bars Bathroom setup: upstairs bathroom walk-in shower with small threshold with grab bars and built-in shower seat. Regular height commode  PLOF:  ADLs independent, IADLs independent for what patient was responsible for, driving, retired Chief Operating Officer), patient has two adult children ([name] and [name]) , enjoys golfing, ride around in truck to visit friends  PATIENT GOALS: Pt wants  to be "back to where I was in February" with no limitations/weakness.   OBJECTIVE:   HAND DOMINANCE: Right, however patient using left hand recently  ADLs:  Eating: Difficulty cutting food, mild difficulty holding cups pending the weight (drinks from [facility] with straw to prevent spilling), slower to not spill Grooming: Brushing teeth with left mostly, slow on the right, not using q-tips currently UB Dressing: Difficulty with buttons, but otherwise independent LB Dressing: Independent Toileting: Independent, mostly using left hand for wiping Bathing: independent Tub Shower transfers: Independent Equipment: Grab bars and Walk in shower  IADLs: Shopping: Wife now doing grocery shopping where as pt would before. Concern for fatigue, balance, and overhead reaching Light housekeeping: Wife performing, but pt would share responsibility prior (goal to fold clothes) Meal Prep: Wife did most of the cooking before. Does not want to address Community mobility: Utilizing SPC 70% of the time. Pt does not use in the house, but if he leaves the house at all. Medication management: Independent, now have pop up tops.  Financial management: Independent Handwriting: 75% legible, improved gripping of pen as well per his report.  MOBILITY STATUS: Independent with SPC  POSTURE COMMENTS:  No Significant postural limitations Sitting balance:  Independent  ACTIVITY TOLERANCE: Activity tolerance: Pt reports he would force himself to get through every aisle of the grocery store, but it would be difficult.   FUNCTIONAL OUTCOME MEASURES: FOTO: Completed 4/23 score 61  UPPER EXTREMITY ROM:    Active ROM Right eval Left eval  Shoulder flexion 100* WFL  Shoulder abduction 110* WFL  Shoulder adduction    Shoulder extension    Shoulder internal rotation Hip ht [facility]  Shoulder external rotation [facility]  Elbow flexion [facility] WFL  Elbow extension [facility] WFL  Wrist flexion 40* WFL  Wrist extension [facility] WFL  Wrist  ulnar deviation    Wrist radial deviation    Wrist pronation [facility] WFL  Wrist supination WFL WFL  (Blank rows = not tested)  Grasp slightly worse of right compared to left  UPPER EXTREMITY MMT:     MMT Right eval Left eval  Shoulder flexion 4-   Shoulder abduction    Shoulder adduction    Shoulder extension 4-   Shoulder internal rotation    Shoulder external rotation    Middle trapezius    Lower trapezius    Elbow flexion 4-   Elbow extension 4-   Wrist flexion    Wrist extension    Wrist ulnar deviation    Wrist radial deviation    Wrist pronation    Wrist supination    (Blank rows = not tested)  Grasp 3+ right hand  HAND FUNCTION: Grip strength: Right: 40.7 lbs; Left: 79.3 lbs and Lateral pinch: Right: 8 lbs, Left: 10 lbs -04/28/23 3-pt pinch: Right 6lbs  COORDINATION: 9 Hole Peg test: Right: 85 sec; Left: 29 sec Box and Blocks:  Right 45 blocks, Left 52 blocks - 04/28/23  SENSATION: WFL per patient report; however, may benefit from stereognosis  EDEMA: None  MUSCLE TONE:  Appears to be WFL  COGNITION: Overall cognitive status: Within functional limits for tasks assessed  VISION: Subjective report: No vision changes noted Baseline vision: No visual deficits  VISION ASSESSMENT: Not tested  Patient has difficulty with following activities due to following visual impairments: None  PERCEPTION: WFL  PRAXIS: WFL  OBSERVATIONS: Pt able to walk from lobby area to table utilizing SPC. No loss of balance. Pt frequently utilizing left hand over right, although notes being right handed. Pt well kept and has supportive spouse.  04/28/23: Pt ambulatory with SPC, no loss of balance, appears to hike hip a bit. Pt well-kept. Wife present in lobby, but did not join patient.  05/10/13: Similar appearance with ambulation SPC. 05/19/23: No changes 05/23/23: Pt ambulatory without device today.   TODAY'S TREATMENT:                                                                                                                                 Pt completed arm bike in sitting for 5 minutes with average RPM of 30 at level 3 for endurance, ROM, and strengthening of affected extremity. Pt alternating direction of pedaling halfway through. Intermittent cues provided to maintain stability with respect to anterior/posterior trunk lean and consistent grasp maintenance.  Shoulder ladder  [employer] at wall  PATIENT EDUCATION: Education details: RUE HEP; R strengthening Person educated: Patient Education method: Explanation, Demonstration, Tactile cues, Verbal cues, and Handouts Education comprehension: verbalized understanding, returned demonstration, and verbal cues required  HOME EXERCISE PROGRAM: 04/28/23: Access Code: [email or web address] URL: [email or web address] Date: 04/28/2023 Prepared by: Madison Reep  05/11/23: Access Code: [email or web address] URL: [email or web address]  05/12/23: None 05/16/23: None 05/19/23: Access Code: [email or web address] URL: [email or web address]  GOALS: Goals reviewed with patient? Yes  SHORT TERM GOALS: Target date: 05/16/2023   Pt will demonstrate improved ease with fastening buttons as evidenced by decreasing 3 button/unbutton time by TBD seconds. Baseline: Wife assists with polo shirts, need to address time it takes to complete Goal status: IN PROGRESS  2.  Pt will demonstrate improved fine motor coordination for ADLs as evidenced by decreasing 9 hole peg test score for Rt hand by 10 secs. Baseline: Rt - 85 sec 05/23/23: Rt - 32 secs Goal status: MET  3.  Pt will improve Rt grip strength by >/=20 lbs next visit as needed for daily activity. Baseline: Will test next visit 04/28/23: Rt 40.7 lbs 05/23/23: Rt 51.8 lbs Goal status: MET  4.  Pt will improve Rt shoulder flexion AROM to 120* required for overhead reaching tasks. Baseline: Rt shoulder flexion 100* 06/05/2023: 122* Goal status: MET  5.  Pt will improve  handwriting legibility to 100% in order to write checks. Baseline: 75% legible 06/05/2023 - 100% legible Goal status: MET  6.  Pt will complete functional activity task in standing for up to 10 minutes without therapeutic rest break to simulate shopping tasks. Baseline: Unable currently for grocery shopping Goal status: IN PROGRESS  LONG  TERM GOALS: Target date: 06/13/2023  Pt will complete FOTO assessment at time of discharge scoring 73 or greater indicating functional progression with ADL and IADL completion. Baseline: 61 Goal status: IN PROGRESS  2.  Pt will demonstrate improved fine motor coordination for ADLs as evidenced by decreasing 9 hole peg test score for Rt hand by 25 secs Baseline: Rt - 85 sec 05/23/23: Rt - 32 secs Goal status: MET  3.  Pt will be able to place at least 50 blocks using Rt hand with completion of Box and Blocks test. Baseline: Will test next visit 04/28/23: Rt 45 blocks 05/23/23: Rt - 45 Goal status: IN PROGRESS  4.  Pt will improve Rt shoulder flexion AROM to 140* required for overhead reaching tasks. Baseline: Rt shoulder flexion 100* Goal status: IN PROGRESS  5.  Pt will be independent with HEP. Baseline:  Goal status: IN PROGRESS  6.  Pt will fold and put away basket of clothes independently. Baseline: Unable currently Goal status: IN PROGRESS  ASSESSMENT:  CLINICAL IMPRESSION: Pt demonstrates good motivation and understanding of HEP as needed to progress towards goals as anticipated. Currently most limited functionally by R shoulder ROM and strength. Believe reported brief R shoulder pain to be due to pinched nerve. Will continue to monitor.   PERFORMANCE DEFICITS: in functional skills including ADLs, IADLs, coordination, dexterity, sensation, ROM, strength, Fine motor control, Gross motor control, mobility, balance, body mechanics, endurance, and UE functional use, and psychosocial skills including interpersonal interactions and routines and  behaviors.   IMPAIRMENTS: are limiting patient from ADLs, IADLs, leisure, social participation, and driving .   CO-MORBIDITIES: may have co-morbidities  that affects occupational performance. Patient will benefit from skilled OT to address above impairments and improve overall function.  REHAB POTENTIAL: Good   PLAN:  OT FREQUENCY: additional 1x/week to 1x every other week  OT DURATION: additional 6 weeks  PLANNED INTERVENTIONS: self care/ADL training, therapeutic exercise, therapeutic activity, neuromuscular re-education, passive range of motion, gait training, balance training, functional mobility training, electrical stimulation, ultrasound, moist heat, cryotherapy, patient/family education, cognitive remediation/compensation, visual/perceptual remediation/compensation, psychosocial skills training, energy conservation, coping strategies training, DME and/or AE instructions, and Re-evaluation  RECOMMENDED OTHER SERVICES: None  CONSULTED AND AGREED WITH PLAN OF CARE: Patient and family member/caregiver  PLAN FOR NEXT SESSION: Review HEP as needed; consider stirring of materials (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

## 2023-06-06 ENCOUNTER — Encounter: Payer: Medicare Other | Admitting: Occupational Therapy

## 2023-06-09 ENCOUNTER — Ambulatory Visit: Payer: Medicare Other | Admitting: Physical Therapy

## 2023-06-09 ENCOUNTER — Ambulatory Visit: Payer: Medicare Other | Admitting: Occupational Therapy

## 2023-06-09 DIAGNOSIS — M6281 Muscle weakness (generalized): Secondary | ICD-10-CM

## 2023-06-09 DIAGNOSIS — R278 Other lack of coordination: Secondary | ICD-10-CM | POA: Diagnosis not present

## 2023-06-09 DIAGNOSIS — R2681 Unsteadiness on feet: Secondary | ICD-10-CM

## 2023-06-09 DIAGNOSIS — M25611 Stiffness of right shoulder, not elsewhere classified: Secondary | ICD-10-CM

## 2023-06-09 DIAGNOSIS — R29898 Other symptoms and signs involving the musculoskeletal system: Secondary | ICD-10-CM

## 2023-06-09 DIAGNOSIS — R262 Difficulty in walking, not elsewhere classified: Secondary | ICD-10-CM

## 2023-06-09 DIAGNOSIS — R2689 Other abnormalities of gait and mobility: Secondary | ICD-10-CM

## 2023-06-09 NOTE — Therapy (Signed)
OUTPATIENT PHYSICAL THERAPY NEURO TREATMENT-DISCHARGE NOTE   Patient Name: Ethan Boyd MRN: [id number] DOB:[birth year], 67 y.o., male Today's Date: 06/09/2023   PCP: Oliver Barre, MD REFERRING PROVIDER: Oliver Barre, MD  PHYSICAL THERAPY DISCHARGE SUMMARY  Visits from Start of Care: 17  Current functional level related to goals / functional outcomes: Mod I   Remaining deficits: Decreased RLE strength and attention to limb during gait   Education / Equipment: Handout for HEP   Patient agrees to discharge. Patient goals were partially met. Patient is being discharged due to maximized rehab potential.     END OF SESSION:  PT End of Session - 06/09/23 1106     Visit Number 17    Number of Visits 17    Date for PT Re-Evaluation 06/09/23    Authorization Type BCBS    PT Start Time 1105   from OT session   PT Stop Time 1135    PT Time Calculation (min) 30 min    Equipment Utilized During Treatment Gait belt    Activity Tolerance Patient tolerated treatment well    Behavior During Therapy WFL for tasks assessed/performed                  Past Medical History:  Diagnosis Date   DIABETES MELLITUS, TYPE II 02/18/2009   Qualifier: Diagnosis of  By: Jonny Ruiz MD, Len Blalock    Diverticulosis 08/31/2012   Colonoscopy, June 10, 2009, Dr Perry/GI   ERECTILE DYSFUNCTION 02/18/2009   Qualifier: Diagnosis of  By: Jonny Ruiz MD, Len Blalock    Erectile dysfunction 09/07/2012   HYPERLIPIDEMIA 02/18/2009   Qualifier: Diagnosis of  By: Jonny Ruiz MD, Len Blalock    HYPERTENSION 02/18/2009   Qualifier: Diagnosis of  By: Jonny Ruiz MD, Len Blalock    Past Surgical History:  Procedure Laterality Date   COLONOSCOPY  06/10/2009   right knee cartilage  12/26/1974   Patient Active Problem List   Diagnosis Date Noted   Acute stroke due to ischemia (HCC) 03/28/2023   Right foot drop 03/28/2023   CVA (cerebral vascular accident) (HCC) 03/19/2023   Vitamin D deficiency 07/31/2022   COPD (chronic obstructive pulmonary  disease) (HCC) 07/29/2022   Pulmonary nodule, left 08/09/2021   Abnormal physical evaluation 07/15/2021   Screen for colon cancer 07/15/2021   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   Type II diabetes mellitus with manifestations (HCC) 07/15/2021   Hyperlipidemia LDL goal <70 07/15/2021   Tachycardia 07/15/2021   PVC (premature ventricular contraction) 07/15/2021   Insulin dependent type 2 diabetes mellitus (HCC) 07/15/2021   Lung nodule 07/15/2021   EKG abnormality 09/07/2012   Erectile dysfunction 09/07/2012   Snoring 09/07/2012   Encounter for well adult exam with abnormal findings 08/31/2012   Diverticulosis 08/31/2012   Essential hypertension 02/18/2009    ONSET DATE: 03/19/23  REFERRING DIAG:  I63.9 (ICD-10-CM) - Acute stroke due to ischemia  M21.371 (ICD-10-CM) - Right foot drop    THERAPY DIAG:  Muscle weakness (generalized)  Other lack of coordination  Unsteadiness on feet  Difficulty in walking, not elsewhere classified  Other abnormalities of gait and mobility  Rationale for Evaluation and Treatment: Rehabilitation  SUBJECTIVE:  SUBJECTIVE STATEMENT: Pt goes by "[name]"  Pt reports no acute changes since last visit. Pt wears his foot up brace in his shoe this date, does need help setting it up properly.  Pt accompanied by: self (driving himself to PT now)  PERTINENT HISTORY: HTN, COPD, insulin-dependent T2DM, HLD   PAIN:  Are you having pain? No  PRECAUTIONS: Fall  PATIENT GOALS: "walk normal"   OBJECTIVE:   DIAGNOSTIC FINDINGS: 03/29/23 brain MRI:  IMPRESSION: Interval evolution of the now subacute infarct in the left paramedian pons. No new acute infarct or intracranial hemorrhage.  TODAY'S TREATMENT:    TherAct: For LTG assessment:  [facility] PT Assessment -  06/09/23 1111       Ambulation/Gait   Gait velocity 32.8 ft over 12 sec = 2.73 ft/sec      Standardized Balance Assessment   Standardized Balance Assessment Berg Balance Test;Five Times Sit to Stand    Five times sit to stand comments  10.12 sec   no UE support     Berg Balance Test   Sit to Stand Able to stand without using hands and stabilize independently    Standing Unsupported Able to stand safely 2 minutes    Sitting with Back Unsupported but Feet Supported on Floor or Stool Able to sit safely and securely 2 minutes    Stand to Sit Sits safely with minimal use of hands    Transfers Able to transfer safely, minor use of hands    Standing Unsupported with Eyes Closed Able to stand 10 seconds safely    Standing Unsupported with Feet Together Able to place feet together independently and stand 1 minute safely    From Standing, Reach Forward with Outstretched Arm Can reach forward >12 cm safely (5")   9 inches   From Standing Position, Pick up Object from Floor Able to pick up shoe safely and easily    From Standing Position, Turn to Look Behind Over each Shoulder Looks behind from both sides and weight shifts well    Turn 360 Degrees Able to turn 360 degrees safely in 4 seconds or less    Standing Unsupported, Alternately Place Feet on Step/Stool Able to stand independently and safely and complete 8 steps in 20 seconds    Standing Unsupported, One Foot in Front Able to take small step independently and hold 30 seconds    Standing on One Leg Able to lift leg independently and hold 5-10 seconds    Total Score 52    Berg comment: 52/56, low fall risk            FOTO: 81.98  PATIENT EDUCATION: Education details: results of OM and functional implications, continue with HEP, plan to d/c from OPPT this date Person educated: Patient Education method: Explanation, Demonstration, and Handouts Education comprehension: verbalized understanding  HOME EXERCISE PROGRAM: Access Code:  [email or web address] URL: [email or web address] Date: 04/03/2023 Prepared by: Peter Congo  Exercises - Alternating Step Taps with Counter Support  - 1 x daily - 7 x weekly - 3 sets - 10 reps (add 5# ankle weight) - Staggered Sit-to-Stand  - 1 x daily - 7 x weekly - 3 sets - 10 reps (bring LLE further forwards) -Left arm supported on hand rail at stairs: lift your R foot onto the 1st step tapping your heel onto the step   -think "up and on" then "up and back" (add 5# ankle weight) -lifting R leg up and over dumbbell or can of soup from a seated position   -  emphasis on up and over and not around (add 5# ankle weight) - Modified Thomas Stretch  - 1 x daily - 7 x weekly - 3 sets - 30-45s hold - Standing lifting R toes off the floor  - Standing Knee Flexion AROM with Chair Support  - 1 x daily - 7 x weekly - 3 sets - 10 reps - Heel Raises with Counter Support  - 1 x daily - 7 x weekly - 3 sets - 10 reps   GOALS: Goals reviewed with patient? Yes  SHORT TERM GOALS: Target date: 04/28/23  Pt will be independent with initial HEP for improved gait mechanics  Baseline: to be provided Goal status: MET  2.  Pt will improve gait speed to >/= .52m/s to demonstrate improved community ambulation  Baseline: .91m/s, 1.73m/s (5/3) Goal status: MET  3.  Pt will improve 5x STS to </= 13 sec to demo improved functional LE strength and balance   Baseline: 15.22s B UE, 11.56 sec no UE (5/3) Goal status: MET  4.  Pt will improve Berg score to 42/56 for decreased fall risk   Baseline: 38/56 (4/8), 48/56 (5/3) Goal status: MET   LONG TERM GOALS: Target date: 06/09/23  Pt will be independent with final HEP for improved gait mechanics  Baseline: to be provided  Goal status: MET  2.  Pt will improve gait speed to >/= 1.1 m/s to demonstrate improved community ambulation Baseline: .22m/s, 1.59m/s (5/3), 0.83 m/s (6/14) Goal status: NOT MET  3.  Pt will improve 5x STS to </= 10 sec to demo  improved functional LE strength and balance  Baseline: 15.22s B UE, 11.56 sec no UE (5/3), 10.12 sec no UE (6/14) Goal status: MET  4.  Pt will improve Berg score to 45/56 for decreased fall risk Baseline: 38/56 (4/8), 48/56 (5/3), 52/56 (6/14) Goal status: MET  5.  Patient will improve FOTO score to >/= 69 to demonstrate functional improvement Baseline: 52, 68.74 (5/3), 81.98 (6/14) Goal status: MET  ASSESSMENT:  CLINICAL IMPRESSION: Emphasis of skilled PT session on reassessing LTG in preparation for d/c from OPPT services this date. Pt has met 4/5 LTG due to being independent with his final HEP, demonstrating increased functional LE strength, improving his balance, and decreasing his fall risk. Pt did not demonstrate improved gait speed this session as compared to previous sessions but did improve in all other areas. Pt to continue with his HEP and continue with use of foot-up brace. Pt educated on how to obtain a referral to return to OPPT services if warranted and process for obtaining an AFO if he chooses to do that in the future. Pt agreeable to d/c at this time.   OBJECTIVE IMPAIRMENTS: Abnormal gait, decreased balance, decreased coordination, decreased endurance, decreased knowledge of condition, decreased knowledge of use of DME, decreased strength, impaired tone, and impaired UE functional use.   ACTIVITY LIMITATIONS: carrying, lifting, standing, squatting, stairs, transfers, locomotion level, and caring for others  PARTICIPATION LIMITATIONS: meal prep, cleaning, interpersonal relationship, driving, shopping, community activity, occupation, and yard work  PERSONAL FACTORS: Age, Fitness, Past/current experiences, Time since onset of injury/illness/exacerbation, Transportation, and 3+ comorbidities: see above  are also affecting patient's functional outcome.   REHAB POTENTIAL: Good  CLINICAL DECISION MAKING: Stable/uncomplicated  EVALUATION COMPLEXITY: Low     Peter Congo, PT, DPT, CSRS 06/09/2023, 11:35 AM

## 2023-06-09 NOTE — Therapy (Signed)
OUTPATIENT OCCUPATIONAL THERAPY NEURO TREATMENT NOTE  Patient Name: Ethan Boyd MRN: [id number] DOB:Aug 07, 1956, 67 y.o., male Today's Date: 06/09/2023  PCP: Corwin Levins, MD REFERRING PROVIDER: Corwin Levins, MD  END OF SESSION:  OT End of Session - 06/09/23 1020     Visit Number 11    Number of Visits 16    Authorization Type Medicare A&B / BCBS    Progress Note Due on Visit 16    OT Start Time 1021    OT Stop Time 1101    OT Time Calculation (min) 40 min    Activity Tolerance Patient tolerated treatment well    Behavior During Therapy [facility] for tasks assessed/performed             Past Medical History:  Diagnosis Date   DIABETES MELLITUS, TYPE II 02/18/2009   Qualifier: Diagnosis of  By: Jonny Ruiz MD, Len Blalock    Diverticulosis 08/31/2012   Colonoscopy, June 10, 2009, Dr Perry/GI   ERECTILE DYSFUNCTION 02/18/2009   Qualifier: Diagnosis of  By: Jonny Ruiz MD, Len Blalock    Erectile dysfunction 09/07/2012   HYPERLIPIDEMIA 02/18/2009   Qualifier: Diagnosis of  By: Jonny Ruiz MD, Len Blalock    HYPERTENSION 02/18/2009   Qualifier: Diagnosis of  By: Jonny Ruiz MD, Len Blalock    Past Surgical History:  Procedure Laterality Date   COLONOSCOPY  06/10/2009   right knee cartilage  12/26/1974   Patient Active Problem List   Diagnosis Date Noted   Acute stroke due to ischemia (HCC) 03/28/2023   Right foot drop 03/28/2023   CVA (cerebral vascular accident) (HCC) 03/19/2023   Vitamin D deficiency 07/31/2022   COPD (chronic obstructive pulmonary disease) (HCC) 07/29/2022   Pulmonary nodule, left 08/09/2021   Abnormal physical evaluation 07/15/2021   Screen for colon cancer 07/15/2021   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   Type II diabetes mellitus with manifestations (HCC) 07/15/2021   Hyperlipidemia LDL goal <70 07/15/2021   Tachycardia 07/15/2021   PVC (premature ventricular contraction) 07/15/2021   Insulin dependent type 2 diabetes mellitus (HCC) 07/15/2021   Lung nodule 07/15/2021   EKG  abnormality 09/07/2012   Erectile dysfunction 09/07/2012   Snoring 09/07/2012   Encounter for well adult exam with abnormal findings 08/31/2012   Diverticulosis 08/31/2012   Essential hypertension 02/18/2009    ONSET DATE: 03/31/2023 (referral date); 03/20/2023 onset date  REFERRING DIAG: R29.898 (ICD-10-CM) - Right arm weakness  THERAPY DIAG:  Muscle weakness (generalized)  Other lack of coordination  Right arm weakness  Stiffness of right shoulder, not elsewhere classified  Rationale for Evaluation and Treatment: Rehabilitation  SUBJECTIVE:   SUBJECTIVE STATEMENT:  Pt goes by "Ethan Boyd."   He feels his elbow is not fully straight especially when he is walking.    Pt accompanied by: self   PERTINENT HISTORY: HTN, COPD, insulin-dependent T2DM, HLD MRI brain was revealing for acute infarct of left pons during hospitalization in March. Repeat MRI brain 03/29/23 - IMPRESSION: Interval evolution of the now subacute infarct in the left paramedian pons. No new acute infarct or intracranial hemorrhage.  PRECAUTIONS: Fall  WEIGHT BEARING RESTRICTIONS: No  PAIN:  Are you having pain? Yes: NPRS scale: 2/10 Pain location: R shoulder  FALLS: Has patient fallen in last 6 months? No  LIVING ENVIRONMENT: Lives with: lives with their spouse Lives in: House/apartment - Two level house with bedroom and bathroom downstairs Stairs: Yes: Internal: 7 steps to landing then 7 more steps; left hand rail for 7 steps, then handrail on  both sides for second set and External: 4 steps; on left going up Has following equipment at home: Single point cane, Walker - 2 wheeled, Wheelchair (transport), and Grab bars Bathroom setup: upstairs bathroom walk-in shower with small threshold with grab bars and built-in shower seat. Regular height commode  PLOF:  ADLs independent, IADLs independent for what patient was responsible for, driving, retired Chief Operating Officer), patient has two adult children ([name] and  [name]) , enjoys golfing, ride around in truck to visit friends  PATIENT GOALS: Pt wants to be "back to where I was in February" with no limitations/weakness.   OBJECTIVE:   HAND DOMINANCE: Right, however patient using left hand recently  ADLs:  Eating: Difficulty cutting food, mild difficulty holding cups pending the weight (drinks from [facility] with straw to prevent spilling), slower to not spill Grooming: Brushing teeth with left mostly, slow on the right, not using q-tips currently UB Dressing: Difficulty with buttons, but otherwise independent LB Dressing: Independent Toileting: Independent, mostly using left hand for wiping Bathing: independent Tub Shower transfers: Independent Equipment: Grab bars and Walk in shower  IADLs: Shopping: Wife now doing grocery shopping where as pt would before. Concern for fatigue, balance, and overhead reaching Light housekeeping: Wife performing, but pt would share responsibility prior (goal to fold clothes) Meal Prep: Wife did most of the cooking before. Does not want to address Community mobility: Utilizing SPC 70% of the time. Pt does not use in the house, but if he leaves the house at all. Medication management: Independent, now have pop up tops.  Financial management: Independent Handwriting: 75% legible, improved gripping of pen as well per his report.  MOBILITY STATUS: Independent with SPC  POSTURE COMMENTS:  No Significant postural limitations Sitting balance:  Independent  ACTIVITY TOLERANCE: Activity tolerance: Pt reports he would force himself to get through every aisle of the grocery store, but it would be difficult.   FUNCTIONAL OUTCOME MEASURES: FOTO: Completed 4/23 score 61  UPPER EXTREMITY ROM:    Active ROM Right eval Left eval  Shoulder flexion 100* WFL  Shoulder abduction 110* WFL  Shoulder adduction    Shoulder extension    Shoulder internal rotation Hip ht [facility]  Shoulder external rotation [facility]  Elbow flexion  [facility] WFL  Elbow extension [facility] WFL  Wrist flexion 40* WFL  Wrist extension [facility] WFL  Wrist ulnar deviation    Wrist radial deviation    Wrist pronation [facility] WFL  Wrist supination WFL WFL  (Blank rows = not tested)  Grasp slightly worse of right compared to left  UPPER EXTREMITY MMT:     MMT Right eval Left eval  Shoulder flexion 4-   Shoulder abduction    Shoulder adduction    Shoulder extension 4-   Shoulder internal rotation    Shoulder external rotation    Middle trapezius    Lower trapezius    Elbow flexion 4-   Elbow extension 4-   Wrist flexion    Wrist extension    Wrist ulnar deviation    Wrist radial deviation    Wrist pronation    Wrist supination    (Blank rows = not tested)  Grasp 3+ right hand  HAND FUNCTION: Grip strength: Right: 40.7 lbs; Left: 79.3 lbs and Lateral pinch: Right: 8 lbs, Left: 10 lbs -04/28/23 3-pt pinch: Right 6lbs  COORDINATION: 9 Hole Peg test: Right: 85 sec; Left: 29 sec Box and Blocks:  Right 45 blocks, Left 52 blocks - 04/28/23  SENSATION: [facility]  per patient report; however, may benefit from stereognosis  EDEMA: None  MUSCLE TONE: Appears to be WFL  COGNITION: Overall cognitive status: Within functional limits for tasks assessed  VISION: Subjective report: No vision changes noted Baseline vision: No visual deficits  VISION ASSESSMENT: Not tested  Patient has difficulty with following activities due to following visual impairments: None  PERCEPTION: WFL  PRAXIS: WFL  OBSERVATIONS: Pt able to walk from lobby area to table utilizing SPC. No loss of balance. Pt frequently utilizing left hand over right, although notes being right handed. Pt well kept and has supportive spouse.  04/28/23: Pt ambulatory with SPC, no loss of balance, appears to hike hip a bit. Pt well-kept. Wife present in lobby, but did not join patient.  05/10/13: Similar appearance with ambulation SPC. 05/19/23: No changes 05/23/23: Pt ambulatory without device  today.   TODAY'S TREATMENT:                                                                                                                                Wrist flex and ext with tan flex bar x 2 min for strength and endurance of affected extremity  Supination with tan flex bar x 2 min for strength and endurance of affected extremity  Pronation with tan flex bar x 2 min for strength and endurance of affected extremity  Pt held red flexbar in affected R hand to hit side of table x 2 min for proprioceptive input as needed to tolerate impact and force through this extremity in addition to grip strength to maintain hold of item.   Using UE for ROM and coordination, pt placed rubber washers on rods in front of them with various heights and sizes (4, 5, 6).   With use of R, pt placed and then removed colored, large pegs collecting 3 at a time and then placing into holes individually. Pt then removed pegs with R gross grasp to pick up colored blocks and place back into bowl with 25 pound hand grip for strengthening of affected extremity for ROM, coordination, and strength of affected extremity.  Reviewed R wrist ext ROM and strengthening exercises.   PATIENT EDUCATION: Education details: R strengthening and stretching Person educated: Patient Education method: Explanation, Demonstration, Tactile cues, Verbal cues, and Handouts Education comprehension: verbalized understanding, returned demonstration, and verbal cues required  HOME EXERCISE PROGRAM: 04/28/23: Access Code: [email or web address] URL: [email or web address] Date: 04/28/2023 Prepared by: Madison Reep  05/11/23: Access Code: [email or web address] URL: [email or web address]  05/12/23: None 05/16/23: None 05/19/23: Access Code: [email or web address] URL: [email or web address]  GOALS: Goals reviewed with patient? Yes  SHORT TERM GOALS: Target date: 05/16/2023   Pt will demonstrate improved ease with fastening buttons as  evidenced by decreasing 3 button/unbutton time by TBD seconds. Baseline: Wife assists with polo shirts, need to address time it takes to complete Goal status: IN PROGRESS  2.  Pt will demonstrate improved fine motor coordination for ADLs as evidenced by decreasing 9  hole peg test score for Rt hand by 10 secs. Baseline: Rt - 85 sec 05/23/23: Rt - 32 secs Goal status: MET  3.  Pt will improve Rt grip strength by >/=20 lbs next visit as needed for daily activity. Baseline: Will test next visit 04/28/23: Rt 40.7 lbs 05/23/23: Rt 51.8 lbs Goal status: MET  4.  Pt will improve Rt shoulder flexion AROM to 120* required for overhead reaching tasks. Baseline: Rt shoulder flexion 100* 06/05/2023: 122* Goal status: MET  5.  Pt will improve handwriting legibility to 100% in order to write checks. Baseline: 75% legible 06/05/2023 - 100% legible Goal status: MET  6.  Pt will complete functional activity task in standing for up to 10 minutes without therapeutic rest break to simulate shopping tasks. Baseline: Unable currently for grocery shopping Goal status: IN PROGRESS  LONG TERM GOALS: Target date: 06/13/2023  Pt will complete FOTO assessment at time of discharge scoring 73 or greater indicating functional progression with ADL and IADL completion. Baseline: 61 Goal status: IN PROGRESS  2.  Pt will demonstrate improved fine motor coordination for ADLs as evidenced by decreasing 9 hole peg test score for Rt hand by 25 secs Baseline: Rt - 85 sec 05/23/23: Rt - 32 secs Goal status: MET  3.  Pt will be able to place at least 50 blocks using Rt hand with completion of Box and Blocks test. Baseline: Will test next visit 04/28/23: Rt 45 blocks 05/23/23: Rt - 45 Goal status: IN PROGRESS  4.  Pt will improve Rt shoulder flexion AROM to 140* required for overhead reaching tasks. Baseline: Rt shoulder flexion 100* Goal status: IN PROGRESS  5.  Pt will be independent with HEP. Baseline:  Goal status:  IN PROGRESS  6.  Pt will fold and put away basket of clothes independently. Baseline: Unable currently Goal status: IN PROGRESS  ASSESSMENT:  CLINICAL IMPRESSION: Pt exhibits difficulty with R wrist extension in pronation. Strained RUE shoulder flexion AROM and elbow extension at end ranges. Will continue to focus on these limitations.   PERFORMANCE DEFICITS: in functional skills including ADLs, IADLs, coordination, dexterity, sensation, ROM, strength, Fine motor control, Gross motor control, mobility, balance, body mechanics, endurance, and UE functional use, and psychosocial skills including interpersonal interactions and routines and behaviors.   IMPAIRMENTS: are limiting patient from ADLs, IADLs, leisure, social participation, and driving .   CO-MORBIDITIES: may have co-morbidities  that affects occupational performance. Patient will benefit from skilled OT to address above impairments and improve overall function.  REHAB POTENTIAL: Good   PLAN:  OT FREQUENCY: additional 1x/week to 1x every other week  OT DURATION: additional 6 weeks  PLANNED INTERVENTIONS: self care/ADL training, therapeutic exercise, therapeutic activity, neuromuscular re-education, passive range of motion, gait training, balance training, functional mobility training, electrical stimulation, ultrasound, moist heat, cryotherapy, patient/family education, cognitive remediation/compensation, visual/perceptual remediation/compensation, psychosocial skills training, energy conservation, coping strategies training, DME and/or AE instructions, and Re-evaluation  RECOMMENDED OTHER SERVICES: None  CONSULTED AND AGREED WITH PLAN OF CARE: Patient and family member/caregiver  PLAN FOR NEXT SESSION: R elbow and wrist ext; consider stirring of materials (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

## 2023-06-22 ENCOUNTER — Ambulatory Visit: Payer: Medicare Other | Admitting: Occupational Therapy

## 2023-06-22 DIAGNOSIS — M25611 Stiffness of right shoulder, not elsewhere classified: Secondary | ICD-10-CM

## 2023-06-22 DIAGNOSIS — R278 Other lack of coordination: Secondary | ICD-10-CM | POA: Diagnosis not present

## 2023-06-22 DIAGNOSIS — M6281 Muscle weakness (generalized): Secondary | ICD-10-CM

## 2023-06-22 DIAGNOSIS — R29898 Other symptoms and signs involving the musculoskeletal system: Secondary | ICD-10-CM

## 2023-06-22 NOTE — Therapy (Signed)
OUTPATIENT OCCUPATIONAL THERAPY NEURO TREATMENT NOTE  Patient Name: Ethan Boyd MRN: [id number] DOB:1956/12/13, 67 y.o., male Today's Date: 06/23/2023  PCP: Ethan Levins, MD REFERRING PROVIDER: Corwin Levins, MD  END OF SESSION:  OT End of Session - 06/22/23 1138     Visit Number 12    Number of Visits 16    Authorization Type Medicare A&B / BCBS    Progress Note Due on Visit 16    OT Start Time 1145    OT Stop Time 1232    OT Time Calculation (min) 47 min    Activity Tolerance Patient tolerated treatment well    Behavior During Therapy [facility] for tasks assessed/performed             Past Medical History:  Diagnosis Date   DIABETES MELLITUS, TYPE II 02/18/2009   Qualifier: Diagnosis of  By: Ethan Ruiz MD, Ethan Boyd    Diverticulosis 08/31/2012   Colonoscopy, June 10, 2009, Dr Ethan Boyd/GI   ERECTILE DYSFUNCTION 02/18/2009   Qualifier: Diagnosis of  By: Ethan Ruiz MD, Ethan Boyd    Erectile dysfunction 09/07/2012   HYPERLIPIDEMIA 02/18/2009   Qualifier: Diagnosis of  By: Ethan Ruiz MD, Ethan Boyd    HYPERTENSION 02/18/2009   Qualifier: Diagnosis of  By: Ethan Ruiz MD, Ethan Boyd    Past Surgical History:  Procedure Laterality Date   COLONOSCOPY  06/10/2009   right knee cartilage  12/26/1974   Patient Active Problem List   Diagnosis Date Noted   Acute stroke due to ischemia (HCC) 03/28/2023   Right foot drop 03/28/2023   CVA (cerebral vascular accident) (HCC) 03/19/2023   Vitamin D deficiency 07/31/2022   COPD (chronic obstructive pulmonary disease) (HCC) 07/29/2022   Pulmonary nodule, left 08/09/2021   Abnormal physical evaluation 07/15/2021   Screen for colon cancer 07/15/2021   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   Type II diabetes mellitus with manifestations (HCC) 07/15/2021   Hyperlipidemia LDL goal <70 07/15/2021   Tachycardia 07/15/2021   PVC (premature ventricular contraction) 07/15/2021   Insulin dependent type 2 diabetes mellitus (HCC) 07/15/2021   Lung nodule 07/15/2021   EKG  abnormality 09/07/2012   Erectile dysfunction 09/07/2012   Snoring 09/07/2012   Encounter for well adult exam with abnormal findings 08/31/2012   Diverticulosis 08/31/2012   Essential hypertension 02/18/2009    ONSET DATE: 03/31/2023 (referral date); 03/20/2023 onset date  REFERRING DIAG: R29.898 (ICD-10-CM) - Right arm weakness  THERAPY DIAG:  Muscle weakness (generalized)  Other lack of coordination  Right arm weakness  Stiffness of right shoulder, not elsewhere classified  Rationale for Evaluation and Treatment: Rehabilitation  SUBJECTIVE:   SUBJECTIVE STATEMENT:  Pt goes by "Ethan Boyd."   He is unsure if he is completing "clock" exercise correctly.  Pt accompanied by: self   PERTINENT HISTORY: HTN, COPD, insulin-dependent T2DM, HLD MRI brain was revealing for acute infarct of left pons during hospitalization in March. Repeat MRI brain 03/29/23 - IMPRESSION: Interval evolution of the now subacute infarct in the left paramedian pons. No new acute infarct or intracranial hemorrhage.  PRECAUTIONS: Fall  WEIGHT BEARING RESTRICTIONS: No  PAIN:  Are you having pain? Yes: NPRS scale: 2/10 Pain location: R shoulder  FALLS: Has patient fallen in last 6 months? No  LIVING ENVIRONMENT: Lives with: lives with their spouse Lives in: House/apartment - Two level house with bedroom and bathroom downstairs Stairs: Yes: Internal: 7 steps to landing then 7 more steps; left hand rail for 7 steps, then handrail on both sides for second set  and External: 4 steps; on left going up Has following equipment at home: Single point cane, Walker - 2 wheeled, Wheelchair (transport), and Grab bars Bathroom setup: upstairs bathroom walk-in shower with small threshold with grab bars and built-in shower seat. Regular height commode  PLOF:  ADLs independent, IADLs independent for what patient was responsible for, driving, retired Chief Operating Officer), patient has two adult children (Ethan Boyd and Ethan Boyd) ,  enjoys golfing, ride around in truck to visit friends  PATIENT GOALS: Pt wants to be "back to where I was in February" with no limitations/weakness.   OBJECTIVE:   HAND DOMINANCE: Right, however patient using left hand recently  ADLs:  Eating: Difficulty cutting food, mild difficulty holding cups pending the weight (drinks from [facility] with straw to prevent spilling), slower to not spill Grooming: Brushing teeth with left mostly, slow on the right, not using q-tips currently UB Dressing: Difficulty with buttons, but otherwise independent LB Dressing: Independent Toileting: Independent, mostly using left hand for wiping Bathing: independent Tub Shower transfers: Independent Equipment: Grab bars and Walk in shower  IADLs: Shopping: Wife now doing grocery shopping where as pt would before. Concern for fatigue, balance, and overhead reaching Light housekeeping: Wife performing, but pt would share responsibility prior (goal to fold clothes) Meal Prep: Wife did most of the cooking before. Does not want to address Community mobility: Utilizing SPC 70% of the time. Pt does not use in the house, but if he leaves the house at all. Medication management: Independent, now have pop up tops.  Financial management: Independent Handwriting: 75% legible, improved gripping of pen as well per his report.  MOBILITY STATUS: Independent with SPC  POSTURE COMMENTS:  No Significant postural limitations Sitting balance:  Independent  ACTIVITY TOLERANCE: Activity tolerance: Pt reports he would force himself to get through every aisle of the grocery store, but it would be difficult.   FUNCTIONAL OUTCOME MEASURES: FOTO: Completed 4/23 score 61  UPPER EXTREMITY ROM:    Active ROM Right eval Left eval  Shoulder flexion 100* WFL  Shoulder abduction 110* WFL  Shoulder adduction    Shoulder extension    Shoulder internal rotation Hip h[facility]  Shoulder external rotation [facility]  Elbow flexion [facility] WFL   Elbow extension [facility] WFL  Wrist flexion 40* WFL  Wrist extension [facility] WFL  Wrist ulnar deviation    Wrist radial deviation    Wrist pronation [facility] WFL  Wrist supination WFL WFL  (Blank rows = not tested)  Grasp slightly worse of right compared to left  UPPER EXTREMITY MMT:     MMT Right eval Left eval  Shoulder flexion 4-   Shoulder abduction    Shoulder adduction    Shoulder extension 4-   Shoulder internal rotation    Shoulder external rotation    Middle trapezius    Lower trapezius    Elbow flexion 4-   Elbow extension 4-   Wrist flexion    Wrist extension    Wrist ulnar deviation    Wrist radial deviation    Wrist pronation    Wrist supination    (Blank rows = not tested)  Grasp 3+ right hand  HAND FUNCTION: Grip strength: Right: 40.7 lbs; Left: 79.3 lbs and Lateral pinch: Right: 8 lbs, Left: 10 lbs -04/28/23 3-pt pinch: Right 6lbs  COORDINATION: 9 Hole Peg test: Right: 85 sec; Left: 29 sec Box and Blocks:  Right 45 blocks, Left 52 blocks - 04/28/23  SENSATION: WFL per patient report; however, may  benefit from stereognosis  EDEMA: None  MUSCLE TONE: Appears to be WFL  COGNITION: Overall cognitive status: Within functional limits for tasks assessed  VISION: Subjective report: No vision changes noted Baseline vision: No visual deficits  VISION ASSESSMENT: Not tested  Patient has difficulty with following activities due to following visual impairments: None  PERCEPTION: WFL  PRAXIS: WFL  OBSERVATIONS: Pt able to walk from lobby area to table utilizing SPC. No loss of balance. Pt frequently utilizing left hand over right, although notes being right handed. Pt well kept and has supportive spouse.  04/28/23: Pt ambulatory with SPC, no loss of balance, appears to hike hip a bit. Pt well-kept. Wife present in lobby, but did not join patient.  05/10/13: Similar appearance with ambulation SPC. 05/19/23: No changes 05/23/23: Pt ambulatory without device  today.   TODAY'S TREATMENT:                                                                                                                                Pt completed RUE internal and external rotation sleeper stretches in supine and sidelying as noted in pt instructions.   Pt completed supine shoulder flexion, abduction, and horizontal abduction AAROM stretches for improved ROM.   Therapist completed perturbations of RUE while in supine with shoulder flex at 90* for improved strength and ROM. Pt cued to keep elbow extended  OT educated pt on completion of theraband "clock" exercise at wall with placement of band on forearm (with attention to elbow extension) or upper arm to isolate strengthening of separate muscles.   PATIENT EDUCATION: Education details: R strengthening and stretching Person educated: Patient Education method: Explanation, Demonstration, Tactile cues, Verbal cues, and Handouts Education comprehension: verbalized understanding, returned demonstration, and verbal cues required  HOME EXERCISE PROGRAM: 04/28/23: Access Code: [email or web address] URL: [email or web address] Date: 04/28/2023 Prepared by: Madison Reep  05/11/23: Access Code: [email or web address] URL: [email or web address]  05/12/23: None 05/16/23: None 05/19/23: Access Code: [email or web address] URL: [email or web address] 06/23/2023: side sleeper stretch  GOALS: Goals reviewed with patient? Yes  SHORT TERM GOALS: Target date: 05/16/2023   Pt will demonstrate improved ease with fastening buttons as evidenced by decreasing 3 button/unbutton time by TBD seconds. Baseline: Wife assists with polo shirts, need to address time it takes to complete Goal status: IN PROGRESS  2.  Pt will demonstrate improved fine motor coordination for ADLs as evidenced by decreasing 9 hole peg test score for Rt hand by 10 secs. Baseline: Rt - 85 sec 05/23/23: Rt - 32 secs Goal status: MET  3.  Pt will improve Rt grip  strength by >/=20 lbs next visit as needed for daily activity. Baseline: Will test next visit 04/28/23: Rt 40.7 lbs 05/23/23: Rt 51.8 lbs Goal status: MET  4.  Pt will improve Rt shoulder flexion AROM to 120* required for overhead reaching tasks. Baseline: Rt shoulder flexion 100* 06/05/2023: 122* Goal status: MET  5.  Pt will improve handwriting legibility to 100%  in order to write checks. Baseline: 75% legible 06/05/2023 - 100% legible Goal status: MET  6.  Pt will complete functional activity task in standing for up to 10 minutes without therapeutic rest break to simulate shopping tasks. Baseline: Unable currently for grocery shopping Goal status: IN PROGRESS  LONG TERM GOALS: Target date: 06/13/2023  Pt will complete FOTO assessment at time of discharge scoring 73 or greater indicating functional progression with ADL and IADL completion. Baseline: 61 Goal status: IN PROGRESS  2.  Pt will demonstrate improved fine motor coordination for ADLs as evidenced by decreasing 9 hole peg test score for Rt hand by 25 secs Baseline: Rt - 85 sec 05/23/23: Rt - 32 secs Goal status: MET  3.  Pt will be able to place at least 50 blocks using Rt hand with completion of Box and Blocks test. Baseline: Will test next visit 04/28/23: Rt 45 blocks 05/23/23: Rt - 45 Goal status: IN PROGRESS  4.  Pt will improve Rt shoulder flexion AROM to 140* required for overhead reaching tasks. Baseline: Rt shoulder flexion 100* Goal status: IN PROGRESS  5.  Pt will be independent with HEP. Baseline:  Goal status: IN PROGRESS  6.  Pt will fold and put away basket of clothes independently. Baseline: Unable currently Goal status: IN PROGRESS  ASSESSMENT:  CLINICAL IMPRESSION: Pt demonstrates good tolerance to therapy today though requires education to complete activities correctly. Focus on R wrist ext during next visit maybe consider kinesio taping.  PERFORMANCE DEFICITS: in functional skills including  ADLs, IADLs, coordination, dexterity, sensation, ROM, strength, Fine motor control, Gross motor control, mobility, balance, body mechanics, endurance, and UE functional use, and psychosocial skills including interpersonal interactions and routines and behaviors.   IMPAIRMENTS: are limiting patient from ADLs, IADLs, leisure, social participation, and driving .   CO-MORBIDITIES: may have co-morbidities  that affects occupational performance. Patient will benefit from skilled OT to address above impairments and improve overall function.  REHAB POTENTIAL: Good   PLAN:  OT FREQUENCY: additional 1x/week to 1x every other week  OT DURATION: additional 6 weeks  PLANNED INTERVENTIONS: self care/ADL training, therapeutic exercise, therapeutic activity, neuromuscular re-education, passive range of motion, gait training, balance training, functional mobility training, electrical stimulation, ultrasound, moist heat, cryotherapy, patient/family education, cognitive remediation/compensation, visual/perceptual remediation/compensation, psychosocial skills training, energy conservation, coping strategies training, DME and/or AE instructions, and Re-evaluation  RECOMMENDED OTHER SERVICES: None  CONSULTED AND AGREED WITH PLAN OF CARE: Patient and family member/caregiver  PLAN FOR NEXT SESSION: R elbow and wrist ext (ball catching/KT tape); consider stirring of 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

## 2023-06-27 ENCOUNTER — Encounter: Payer: Self-pay | Admitting: Internal Medicine

## 2023-06-27 ENCOUNTER — Ambulatory Visit (INDEPENDENT_AMBULATORY_CARE_PROVIDER_SITE_OTHER): Payer: Medicare Other | Admitting: Internal Medicine

## 2023-06-27 VITALS — BP 122/80 | HR 110 | Temp 98.7°F | Ht 72.0 in | Wt 169.0 lb

## 2023-06-27 DIAGNOSIS — Z0000 Encounter for general adult medical examination without abnormal findings: Secondary | ICD-10-CM | POA: Diagnosis not present

## 2023-06-27 DIAGNOSIS — E538 Deficiency of other specified B group vitamins: Secondary | ICD-10-CM | POA: Diagnosis not present

## 2023-06-27 DIAGNOSIS — E118 Type 2 diabetes mellitus with unspecified complications: Secondary | ICD-10-CM

## 2023-06-27 DIAGNOSIS — I10 Essential (primary) hypertension: Secondary | ICD-10-CM

## 2023-06-27 DIAGNOSIS — E119 Type 2 diabetes mellitus without complications: Secondary | ICD-10-CM

## 2023-06-27 DIAGNOSIS — Z8673 Personal history of transient ischemic attack (TIA), and cerebral infarction without residual deficits: Secondary | ICD-10-CM

## 2023-06-27 DIAGNOSIS — Z0001 Encounter for general adult medical examination with abnormal findings: Secondary | ICD-10-CM

## 2023-06-27 DIAGNOSIS — E785 Hyperlipidemia, unspecified: Secondary | ICD-10-CM

## 2023-06-27 DIAGNOSIS — E559 Vitamin D deficiency, unspecified: Secondary | ICD-10-CM

## 2023-06-27 DIAGNOSIS — Z794 Long term (current) use of insulin: Secondary | ICD-10-CM

## 2023-06-27 DIAGNOSIS — Z125 Encounter for screening for malignant neoplasm of prostate: Secondary | ICD-10-CM | POA: Diagnosis not present

## 2023-06-27 LAB — HEPATIC FUNCTION PANEL
ALT: 23 U/L (ref 0–53)
AST: 19 U/L (ref 0–37)
Albumin: 4.6 g/dL (ref 3.5–5.2)
Alkaline Phosphatase: 115 U/L (ref 39–117)
Bilirubin, Direct: 0.1 mg/dL (ref 0.0–0.3)
Total Bilirubin: 0.4 mg/dL (ref 0.2–1.2)
Total Protein: 7.8 g/dL (ref 6.0–8.3)

## 2023-06-27 LAB — URINALYSIS, ROUTINE W REFLEX MICROSCOPIC
Bilirubin Urine: NEGATIVE
Hgb urine dipstick: NEGATIVE
Leukocytes,Ua: NEGATIVE
Nitrite: NEGATIVE
RBC / HPF: NONE SEEN (ref 0–?)
Specific Gravity, Urine: <=1.005 — AB (ref 1.000–1.030)
Urine Glucose: >=1000 — AB
Urobilinogen, UA: 0.2 (ref 0.0–1.0)
WBC, UA: NONE SEEN (ref 0–?)
pH: 6.5 (ref 5.0–8.0)

## 2023-06-27 LAB — CBC WITH DIFFERENTIAL/PLATELET
Basophils Absolute: 0.1 10*3/uL (ref 0.0–0.1)
Basophils Relative: 0.6 % (ref 0.0–3.0)
Eosinophils Absolute: 0.1 10*3/uL (ref 0.0–0.7)
Eosinophils Relative: 1.2 % (ref 0.0–5.0)
HCT: 45.9 % (ref 39.0–52.0)
Hemoglobin: 15.3 g/dL (ref 13.0–17.0)
Lymphocytes Relative: 16.9 % (ref 12.0–46.0)
Lymphs Abs: 1.4 10*3/uL (ref 0.7–4.0)
MCHC: 33.4 g/dL (ref 30.0–36.0)
MCV: 92.6 fl (ref 78.0–100.0)
Monocytes Absolute: 0.9 10*3/uL (ref 0.1–1.0)
Monocytes Relative: 10.7 % (ref 3.0–12.0)
Neutro Abs: 5.9 10*3/uL (ref 1.4–7.7)
Neutrophils Relative %: 70.6 % (ref 43.0–77.0)
Platelets: 259 10*3/uL (ref 150.0–400.0)
RBC: 4.95 Mil/uL (ref 4.22–5.81)
RDW: 12.9 % (ref 11.5–15.5)
WBC: 8.4 10*3/uL (ref 4.0–10.5)

## 2023-06-27 LAB — VITAMIN D 25 HYDROXY (VIT D DEFICIENCY, FRACTURES): VITD: 33.91 ng/mL (ref 30.00–100.00)

## 2023-06-27 LAB — BASIC METABOLIC PANEL
BUN: 17 mg/dL (ref 6–23)
CO2: 30 mEq/L (ref 19–32)
Calcium: 10.4 mg/dL (ref 8.4–10.5)
Chloride: 99 mEq/L (ref 96–112)
Creatinine, Ser: 0.93 mg/dL (ref 0.40–1.50)
GFR: 85.47 mL/min (ref >60.00)
Glucose, Bld: 155 mg/dL — ABNORMAL HIGH (ref 70–99)
Potassium: 4.4 mEq/L (ref 3.5–5.1)
Sodium: 140 mEq/L (ref 135–145)

## 2023-06-27 LAB — LIPID PANEL
Cholesterol: 144 mg/dL (ref 0–200)
HDL: 62.7 mg/dL (ref >39.00)
LDL Cholesterol: 57 mg/dL (ref 0–99)
NonHDL: 81.28
Total CHOL/HDL Ratio: 2
Triglycerides: 123 mg/dL (ref 0.0–149.0)
VLDL: 24.6 mg/dL (ref 0.0–40.0)

## 2023-06-27 LAB — MICROALBUMIN / CREATININE URINE RATIO
Creatinine,U: 39.5 mg/dL
Microalb Creat Ratio: 48.7 mg/g — ABNORMAL HIGH (ref 0.0–30.0)
Microalb, Ur: 19.2 mg/dL — ABNORMAL HIGH (ref 0.0–1.9)

## 2023-06-27 LAB — PSA: PSA: 0.91 ng/mL (ref 0.10–4.00)

## 2023-06-27 LAB — VITAMIN B12: Vitamin B-12: 514 pg/mL (ref 211–911)

## 2023-06-27 LAB — TSH: TSH: 1.45 u[IU]/mL (ref 0.35–5.50)

## 2023-06-27 LAB — HEMOGLOBIN A1C: Hgb A1c MFr Bld: 6.6 % — ABNORMAL HIGH (ref 4.6–6.5)

## 2023-06-27 MED ORDER — METFORMIN HCL ER 750 MG PO TB24: 1500.0000 mg | ORAL_TABLET | Freq: Every day | ORAL | 3 refills | Status: DC

## 2023-06-27 MED ORDER — AMLODIPINE BESYLATE 5 MG PO TABS: 5.0000 mg | ORAL_TABLET | Freq: Every day | ORAL | 3 refills | Status: DC

## 2023-06-27 NOTE — Patient Instructions (Signed)
Please continue all other medications as before, and refills have been done if requested.  Please have the pharmacy call with any other refills you may need.  Please continue your efforts at being more active, low cholesterol diet, and weight control.  You are otherwise up to date with prevention measures today.  Please keep your appointments with your specialists as you may have planned - DR Pearlean Brownie  Please go to the LAB at the blood drawing area for the tests to be done  You will be contacted by phone if any changes need to be made immediately.  Otherwise, you will receive a letter about your results with an explanation, but please check with MyChart first.  Please remember to sign up for MyChart if you have not done so, as this will be important to you in the future with finding out test results, communicating by private email, and scheduling acute appointments online when needed.  Please make an Appointment to return for your 1 year visit, or sooner if needed

## 2023-06-27 NOTE — Progress Notes (Unsigned)
Patient ID: Ethan Boyd, male   DOB: 1956-03-03, 67 y.o.   MRN: [id number]         Chief Complaint:: wellness exam and htn, dm, hld, hist stroke, low vit d        HPI:  Ethan Boyd is a 67 y.o. male here for wellness exam; plans to call soon for eye exam; declines pneumovax o/w up to date                        Also finished PT post stroke,did not get the AFO per personal preference, gait somewhat changed but does not need cane or other device.  Pt denies chest pain, increased sob or doe, wheezing, orthopnea, PND, increased LE swelling, palpitations, dizziness or syncope.   Pt denies polydipsia, polyuria, or new focal neuro s/s.    Pt denies fever, wt loss, night sweats, loss of appetite, or other constitutional symptoms      Wt Readings from Last 3 Encounters:  06/27/23 169 lb (76.7 kg)  03/28/23 169 lb (76.7 kg)  03/19/23 173 lb 15.1 oz (78.9 kg)   BP Readings from Last 3 Encounters:  06/27/23 122/80  03/28/23 128/74  03/20/23 (!) 158/80   Immunization History  Administered Date(s) Administered   Influenza Split 09/07/2012   Influenza Whole 09/25/2008   Influenza,inj,Quad PF,6+ Mos 11/09/2018, 09/17/2019   Influenza-Unspecified 09/25/2017   PFIZER(Purple Top)SARS-COV-2 Vaccination 03/30/2020, 04/21/2020   Td 02/18/2009   Tdap 01/15/2020, 01/16/2020   Zoster Recombinant(Shingrix) 10/01/2019, 12/23/2019   Health Maintenance Due  Topic Date Due   Medicare Annual Wellness (AWV)  Never done      Past Medical History:  Diagnosis Date   DIABETES MELLITUS, TYPE II 02/18/2009   Qualifier: Diagnosis of  By: Jonny Ruiz MD, Len Blalock    Diverticulosis 08/31/2012   Colonoscopy, June 10, 2009, Dr Perry/GI   ERECTILE DYSFUNCTION 02/18/2009   Qualifier: Diagnosis of  By: Jonny Ruiz MD, Len Blalock    Erectile dysfunction 09/07/2012   HYPERLIPIDEMIA 02/18/2009   Qualifier: Diagnosis of  By: Jonny Ruiz MD, Len Blalock    HYPERTENSION 02/18/2009   Qualifier: Diagnosis of  By: Jonny Ruiz MD, Len Blalock    Past Surgical  History:  Procedure Laterality Date   COLONOSCOPY  06/10/2009   right knee cartilage  12/26/1974    reports that he has never smoked. He has never used smokeless tobacco. He reports current alcohol use of about 8.0 standard drinks of alcohol per week. He reports that he does not use drugs. family history includes Diabetes in an other family member. Allergies  Allergen Reactions   Aspirin     REACTION: angioedema   Current Outpatient Medications on File Prior to Visit  Medication Sig Dispense Refill   Blood Glucose Monitoring Suppl DEVI 1 each by Does not apply route 3 (three) times daily. May dispense any manufacturer covered by patient's insurance. 1 each 0   clopidogrel (PLAVIX) 75 MG tablet Take 1 tablet (75 mg total) by mouth daily. 90 tablet 3   Continuous Blood Gluc Receiver (FREESTYLE LIBRE 2 READER) DEVI 1 Act by Does not apply route daily. 2 each 5   Continuous Blood Gluc Sensor (FREESTYLE LIBRE 2 SENSOR) MISC 1 Act by Does not apply route daily. 2 each 5   dapagliflozin propanediol (FARXIGA) 10 MG TABS tablet Take 1 tablet (10 mg total) by mouth daily before breakfast. 90 tablet 0   Glucose Blood (BLOOD GLUCOSE TEST STRIPS) STRP 1 each by Does  not apply route 3 (three) times daily. Use as directed to check blood sugar. May dispense any manufacturer covered by patient's insurance and fits patient's device. 100 strip 0   insulin glargine (LANTUS) 100 UNIT/ML Solostar Pen Inject 8 Units into the skin at bedtime. 15 mL 0   Insulin Pen Needle (PEN NEEDLES) 31G X 5 MM MISC 1 each by Does not apply route 3 (three) times daily. May dispense any manufacturer covered by patient's insurance. 100 each 0   Lancet Device MISC 1 each by Does not apply route 3 (three) times daily. May dispense any manufacturer covered by patient's insurance. 1 each 0   Lancets MISC 1 each by Does not apply route 3 (three) times daily. Use as directed to check blood sugar. May dispense any manufacturer covered by  patient's insurance and fits patient's device. 100 each 0   Multiple Vitamin (MULTIVITAMIN WITH MINERALS) TABS tablet Take 1 tablet by mouth daily.     Omega-3 Fatty Acids (FISH OIL) 1200 MG CAPS Take by mouth.     rosuvastatin (CRESTOR) 40 MG tablet Take 1 tablet (40 mg total) by mouth daily. 90 tablet 3   Study - OCEANIC-STROKE - asundexian 50 mg or placebo tablet (PI-Sethi) Take 1 tablet (50 mg total) by mouth daily. For Investigational Use Only. Take at the same time each day (preferably in the morning). Tablet should be swallowed whole with water; it CANNOT be crushed or broken. Please contact [facility] for any questions or concerns regarding this medication. 98 tablet 0   No current facility-administered medications on file prior to visit.        ROS:  All others reviewed and negative.  Objective        PE:  BP 122/80 (BP Location: Right Arm, Patient Position: Sitting, Cuff Size: Normal)   Pulse (!) 110   Temp 98.7 F (37.1 C) (Oral)   Ht 6' (1.829 m)   Wt 169 lb (76.7 kg)   SpO2 97%   BMI 22.92 kg/m                 Constitutional: Pt appears in NAD               HENT: Head: NCAT.                Right Ear: External ear normal.                 Left Ear: External ear normal.                Eyes: . Pupils are equal, round, and reactive to light. Conjunctivae and EOM are normal               Nose: without d/c or deformity               Neck: Neck supple. Gross normal ROM               Cardiovascular: Normal rate and regular rhythm.                 Pulmonary/Chest: Effort normal and breath sounds without rales or wheezing.                Abd:  Soft, NT, ND, + BS, no organomegaly               Neurological: Pt is alert. At baseline orientation, motor  - right foot drop mild to mod  Skin: Skin is warm. No rashes, no other new lesions, LE edema - none               Psychiatric: Pt behavior is normal without agitation   Micro: none  Cardiac tracings I  have personally interpreted today:  none  Pertinent Radiological findings (summarize): none   Lab Results  Component Value Date   WBC 8.4 06/27/2023   HGB 15.3 06/27/2023   HCT 45.9 06/27/2023   PLT 259.0 06/27/2023   GLUCOSE 155 (H) 06/27/2023   CHOL 144 06/27/2023   TRIG 123.0 06/27/2023   HDL 62.70 06/27/2023   LDLDIRECT 136.4 09/10/2012   LDLCALC 57 06/27/2023   ALT 23 06/27/2023   AST 19 06/27/2023   NA 140 06/27/2023   K 4.4 06/27/2023   CL 99 06/27/2023   CREATININE 0.93 06/27/2023   BUN 17 06/27/2023   CO2 30 06/27/2023   TSH 1.45 06/27/2023   PSA 0.91 06/27/2023   INR 1.0 03/19/2023   HGBA1C 6.6 (H) 06/27/2023   MICROALBUR 19.2 (H) 06/27/2023   Assessment/Plan:  Ethan Boyd is a 67 y.o. White or Caucasian [1] male with  has a past medical history of DIABETES MELLITUS, TYPE II (02/18/2009), Diverticulosis (08/31/2012), ERECTILE DYSFUNCTION (02/18/2009), Erectile dysfunction (09/07/2012), HYPERLIPIDEMIA (02/18/2009), and HYPERTENSION (02/18/2009).  Encounter for well adult exam with abnormal findings Age and sex appropriate education and counseling updated with regular exercise and diet Referrals for preventative services - plans to call for eye exam soon Immunizations addressed - declines pneumonia vax Smoking counseling  - none needed Evidence for depression or other mood disorder - none significant Most recent labs reviewed. I have personally reviewed and have noted: 1) the patient's medical and social history 2) The patient's current medications and supplements 3) The patient's height, weight, and BMI have been recorded in the chart   Essential hypertension BP Readings from Last 3 Encounters:  06/27/23 122/80  03/28/23 128/74  03/20/23 (!) 158/80   Stable, pt to continue medical treatment norvasc 5 mg qd   Hyperlipidemia LDL goal <70 Lab Results  Component Value Date   LDLCALC 57 06/27/2023   Stable, pt to continue current statin crestor 40 mg  qd   Type II diabetes mellitus with manifestations (HCC) Lab Results  Component Value Date   HGBA1C 6.6 (H) 06/27/2023   Slight uncontrolled, pt to continue current medical treatment farxiga 10 mg, metformin ER 750 mg - 2 qam but ok to stop the lantus 8 u temporarily given at last hosp d/c   Vitamin D deficiency Last vitamin D Lab Results  Component Value Date   VD25OH 33.91 06/27/2023   Low, to stop oral replacement   History of completed stroke [place], cont statin and plavix Followup: Return in about 1 year (around 06/26/2024).  Oliver Barre, MD 06/29/2023 6:48 PM [facility] Internal Medicine

## 2023-06-27 NOTE — Progress Notes (Signed)
The test results show that your current treatment is OK, as the tests are stable.  Please continue the same plan.  There is no other need for change of treatment or further evaluation based on these results, at this time.  thanks 

## 2023-06-29 ENCOUNTER — Encounter: Payer: Self-pay | Admitting: Internal Medicine

## 2023-06-29 DIAGNOSIS — Z8673 Personal history of transient ischemic attack (TIA), and cerebral infarction without residual deficits: Secondary | ICD-10-CM | POA: Insufficient documentation

## 2023-06-29 NOTE — Assessment & Plan Note (Signed)
Lab Results  Component Value Date   LDLCALC 57 06/27/2023   Stable, pt to continue current statin crestor 40 mg qd

## 2023-06-29 NOTE — Assessment & Plan Note (Signed)
BP Readings from Last 3 Encounters:  06/27/23 122/80  03/28/23 128/74  03/20/23 (!) 158/80   Stable, pt to continue medical treatment norvasc 5 mg qd

## 2023-06-29 NOTE — Assessment & Plan Note (Signed)
Lab Results  Component Value Date   HGBA1C 6.6 (H) 06/27/2023   Slight uncontrolled, pt to continue current medical treatment farxiga 10 mg, metformin ER 750 mg - 2 qam but ok to stop the lantus 8 u temporarily given at last hosp d/c

## 2023-06-29 NOTE — Assessment & Plan Note (Signed)
Last vitamin D Lab Results  Component Value Date   VD25OH 33.91 06/27/2023   Low, to stop oral replacement

## 2023-06-29 NOTE — Assessment & Plan Note (Signed)
Age and sex appropriate education and counseling updated with regular exercise and diet Referrals for preventative services - plans to call for eye exam soon Immunizations addressed - declines pneumonia vax Smoking counseling  - none needed Evidence for depression or other mood disorder - none significant Most recent labs reviewed. I have personally reviewed and have noted: 1) the patient's medical and social history 2) The patient's current medications and supplements 3) The patient's height, weight, and BMI have been recorded in the chart

## 2023-06-29 NOTE — Assessment & Plan Note (Signed)
Stalbe, cont statin and plavix

## 2023-06-30 ENCOUNTER — Other Ambulatory Visit: Payer: Self-pay

## 2023-06-30 DIAGNOSIS — E118 Type 2 diabetes mellitus with unspecified complications: Secondary | ICD-10-CM

## 2023-07-04 ENCOUNTER — Ambulatory Visit: Payer: Medicare Other | Attending: Internal Medicine | Admitting: Occupational Therapy

## 2023-07-04 DIAGNOSIS — R29898 Other symptoms and signs involving the musculoskeletal system: Secondary | ICD-10-CM | POA: Insufficient documentation

## 2023-07-04 DIAGNOSIS — M25611 Stiffness of right shoulder, not elsewhere classified: Secondary | ICD-10-CM | POA: Diagnosis present

## 2023-07-04 DIAGNOSIS — M6281 Muscle weakness (generalized): Secondary | ICD-10-CM | POA: Insufficient documentation

## 2023-07-04 DIAGNOSIS — R278 Other lack of coordination: Secondary | ICD-10-CM | POA: Diagnosis present

## 2023-07-04 NOTE — Therapy (Unsigned)
OUTPATIENT OCCUPATIONAL THERAPY NEURO TREATMENT NOTE  Patient Name: Ethan Boyd MRN: [id number] DOB:1956/04/05, 67 y.o., male Today's Date: 07/04/2023  PCP: Corwin Levins, MD REFERRING PROVIDER: Corwin Levins, MD  END OF SESSION:  OT End of Session - 07/04/23 1721     Visit Number 13    Number of Visits 16    Authorization Type Medicare A&B / BCBS    Progress Note Due on Visit 16    OT Start Time 1153    OT Stop Time 1231    OT Time Calculation (min) 38 min    Activity Tolerance Patient tolerated treatment well    Behavior During Therapy [facility] for tasks assessed/performed              Past Medical History:  Diagnosis Date   DIABETES MELLITUS, TYPE II 02/18/2009   Qualifier: Diagnosis of  By: Jonny Ruiz MD, Len Blalock    Diverticulosis 08/31/2012   Colonoscopy, June 10, 2009, Dr Perry/GI   ERECTILE DYSFUNCTION 02/18/2009   Qualifier: Diagnosis of  By: Jonny Ruiz MD, Len Blalock    Erectile dysfunction 09/07/2012   HYPERLIPIDEMIA 02/18/2009   Qualifier: Diagnosis of  By: Jonny Ruiz MD, Len Blalock    HYPERTENSION 02/18/2009   Qualifier: Diagnosis of  By: Jonny Ruiz MD, Len Blalock    Past Surgical History:  Procedure Laterality Date   COLONOSCOPY  06/10/2009   right knee cartilage  12/26/1974   Patient Active Problem List   Diagnosis Date Noted   History of completed stroke 06/29/2023   Acute stroke due to ischemia (HCC) 03/28/2023   Right foot drop 03/28/2023   CVA (cerebral vascular accident) (HCC) 03/19/2023   Vitamin D deficiency 07/31/2022   COPD (chronic obstructive pulmonary disease) (HCC) 07/29/2022   Pulmonary nodule, left 08/09/2021   Abnormal physical evaluation 07/15/2021   Screen for colon cancer 07/15/2021   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   Type II diabetes mellitus with manifestations (HCC) 07/15/2021   Hyperlipidemia LDL goal <70 07/15/2021   Tachycardia 07/15/2021   PVC (premature ventricular contraction) 07/15/2021   Insulin dependent type 2 diabetes mellitus (HCC)  07/15/2021   Lung nodule 07/15/2021   EKG abnormality 09/07/2012   Erectile dysfunction 09/07/2012   Snoring 09/07/2012   Encounter for well adult exam with abnormal findings 08/31/2012   Diverticulosis 08/31/2012   Essential hypertension 02/18/2009    ONSET DATE: 03/31/2023 (referral date); 03/20/2023 onset date  REFERRING DIAG: R29.898 (ICD-10-CM) - Right arm weakness  THERAPY DIAG:  Muscle weakness (generalized)  Other lack of coordination  Right arm weakness  Stiffness of right shoulder, not elsewhere classified  Rationale for Evaluation and Treatment: Rehabilitation  SUBJECTIVE:   SUBJECTIVE STATEMENT:  Pt goes by "Ethan Boyd."   He is unsure if he is completing "clock" exercise correctly.  Pt accompanied by: self   PERTINENT HISTORY: HTN, COPD, insulin-dependent T2DM, HLD MRI brain was revealing for acute infarct of left pons during hospitalization in March. Repeat MRI brain 03/29/23 - IMPRESSION: Interval evolution of the now subacute infarct in the left paramedian pons. No new acute infarct or intracranial hemorrhage.  PRECAUTIONS: Fall  WEIGHT BEARING RESTRICTIONS: No  PAIN:  Are you having pain? Yes: NPRS scale: 2/10 Pain location: R shoulder  FALLS: Has patient fallen in last 6 months? No  LIVING ENVIRONMENT: Lives with: lives with their spouse Lives in: House/apartment - Two level house with bedroom and bathroom downstairs Stairs: Yes: Internal: 7 steps to landing then 7 more steps; left hand rail for 7 steps,  then handrail on both sides for second set and External: 4 steps; on left going up Has following equipment at home: Single point cane, Walker - 2 wheeled, Wheelchair (transport), and Grab bars Bathroom setup: upstairs bathroom walk-in shower with small threshold with grab bars and built-in shower seat. Regular height commode  PLOF:  ADLs independent, IADLs independent for what patient was responsible for, driving, retired Chief Operating Officer), patient  has two adult children ([name] and [name]) , enjoys golfing, ride around in truck to visit friends  PATIENT GOALS: Pt wants to be "back to where I was in February" with no limitations/weakness.   OBJECTIVE:   HAND DOMINANCE: Right, however patient using left hand recently  ADLs:  Eating: Difficulty cutting food, mild difficulty holding cups pending the weight (drinks from [facility] with straw to prevent spilling), slower to not spill Grooming: Brushing teeth with left mostly, slow on the right, not using q-tips currently UB Dressing: Difficulty with buttons, but otherwise independent LB Dressing: Independent Toileting: Independent, mostly using left hand for wiping Bathing: independent Tub Shower transfers: Independent Equipment: Grab bars and Walk in shower  IADLs: Shopping: Wife now doing grocery shopping where as pt would before. Concern for fatigue, balance, and overhead reaching Light housekeeping: Wife performing, but pt would share responsibility prior (goal to fold clothes) Meal Prep: Wife did most of the cooking before. Does not want to address Community mobility: Utilizing SPC 70% of the time. Pt does not use in the house, but if he leaves the house at all. Medication management: Independent, now have pop up tops.  Financial management: Independent Handwriting: 75% legible, improved gripping of pen as well per his report.  MOBILITY STATUS: Independent with SPC  POSTURE COMMENTS:  No Significant postural limitations Sitting balance:  Independent  ACTIVITY TOLERANCE: Activity tolerance: Pt reports he would force himself to get through every aisle of the grocery store, but it would be difficult.   FUNCTIONAL OUTCOME MEASURES: FOTO: Completed 4/23 score 61  UPPER EXTREMITY ROM:    Active ROM Right eval Left eval  Shoulder flexion 100* WFL  Shoulder abduction 110* WFL  Shoulder adduction    Shoulder extension    Shoulder internal rotation Hip ht [facility]  Shoulder external  rotation [facility]  Elbow flexion [facility] WFL  Elbow extension [facility] WFL  Wrist flexion 40* WFL  Wrist extension [facility] WFL  Wrist ulnar deviation    Wrist radial deviation    Wrist pronation [facility] WFL  Wrist supination WFL WFL  (Blank rows = not tested)  Grasp slightly worse of right compared to left  UPPER EXTREMITY MMT:     MMT Right eval Left eval  Shoulder flexion 4-   Shoulder abduction    Shoulder adduction    Shoulder extension 4-   Shoulder internal rotation    Shoulder external rotation    Middle trapezius    Lower trapezius    Elbow flexion 4-   Elbow extension 4-   Wrist flexion    Wrist extension    Wrist ulnar deviation    Wrist radial deviation    Wrist pronation    Wrist supination    (Blank rows = not tested)  Grasp 3+ right hand  HAND FUNCTION: Grip strength: Right: 40.7 lbs; Left: 79.3 lbs and Lateral pinch: Right: 8 lbs, Left: 10 lbs -04/28/23 3-pt pinch: Right 6lbs  COORDINATION: 9 Hole Peg test: Right: 85 sec; Left: 29 sec Box and Blocks:  Right 45 blocks, Left 52 blocks - 04/28/23  SENSATION: WFL per patient report; however, may benefit from stereognosis  EDEMA: None  MUSCLE TONE: Appears to be WFL  COGNITION: Overall cognitive status: Within functional limits for tasks assessed  VISION: Subjective report: No vision changes noted Baseline vision: No visual deficits  VISION ASSESSMENT: Not tested  Patient has difficulty with following activities due to following visual impairments: None  PERCEPTION: WFL  PRAXIS: WFL  OBSERVATIONS: Pt able to walk from lobby area to table utilizing SPC. No loss of balance. Pt frequently utilizing left hand over right, although notes being right handed. Pt well kept and has supportive spouse.  04/28/23: Pt ambulatory with SPC, no loss of balance, appears to hike hip a bit. Pt well-kept. Wife present in lobby, but did not join patient.  05/10/13: Similar appearance with ambulation SPC. 05/19/23: No  changes 05/23/23: Pt ambulatory without device today.   TODAY'S TREATMENT:                                                                                                                                Pt completed RUE internal and external rotation sleeper stretches in supine and sidelying as noted in pt instructions.   Pt completed supine shoulder flexion, abduction, and horizontal abduction AAROM stretches for improved ROM.   Therapist completed perturbations of RUE while in supine with shoulder flex at 90* for improved strength and ROM. Pt cued to keep elbow extended  OT educated pt on completion of theraband "clock" exercise at wall with placement of band on forearm (with attention to elbow extension) or upper arm to isolate strengthening of separate muscles.   PATIENT EDUCATION: Education details: R strengthening and stretching Person educated: Patient Education method: Explanation, Demonstration, Tactile cues, Verbal cues, and Handouts Education comprehension: verbalized understanding, returned demonstration, and verbal cues required  HOME EXERCISE PROGRAM: 04/28/23: Access Code: [email or web address] URL: [email or web address] Date: 04/28/2023 Prepared by: Madison Reep  05/11/23: Access Code: [email or web address] URL: [email or web address]  05/12/23: None 05/16/23: None 05/19/23: Access Code: [email or web address] URL: [email or web address] 06/23/2023: side sleeper stretch  GOALS: Goals reviewed with patient? Yes  SHORT TERM GOALS: Target date: 05/16/2023   Pt will demonstrate improved ease with fastening buttons as evidenced by decreasing 3 button/unbutton time by TBD seconds. Baseline: Wife assists with polo shirts, need to address time it takes to complete Goal status: IN PROGRESS  2.  Pt will demonstrate improved fine motor coordination for ADLs as evidenced by decreasing 9 hole peg test score for Rt hand by 10 secs. Baseline: Rt - 85 sec 05/23/23: Rt - 32  secs Goal status: MET  3.  Pt will improve Rt grip strength by >/=20 lbs next visit as needed for daily activity. Baseline: Will test next visit 04/28/23: Rt 40.7 lbs 05/23/23: Rt 51.8 lbs Goal status: MET  4.  Pt will improve Rt shoulder flexion AROM to 120* required for overhead reaching tasks. Baseline: Rt shoulder flexion 100* 06/05/2023: 122* Goal status: MET  5.  Pt will improve handwriting legibility to 100% in order to write checks. Baseline: 75% legible 06/05/2023 - 100% legible Goal status: MET  6.  Pt will complete functional activity task in standing for up to 10 minutes without therapeutic rest break to simulate shopping tasks. Baseline: Unable currently for grocery shopping Goal status: IN PROGRESS  LONG TERM GOALS: Target date: 06/13/2023  Pt will complete FOTO assessment at time of discharge scoring 73 or greater indicating functional progression with ADL and IADL completion. Baseline: 61 Goal status: IN PROGRESS  2.  Pt will demonstrate improved fine motor coordination for ADLs as evidenced by decreasing 9 hole peg test score for Rt hand by 25 secs Baseline: Rt - 85 sec 05/23/23: Rt - 32 secs Goal status: MET  3.  Pt will be able to place at least 50 blocks using Rt hand with completion of Box and Blocks test. Baseline: Will test next visit 04/28/23: Rt 45 blocks 05/23/23: Rt - 45 Goal status: IN PROGRESS  4.  Pt will improve Rt shoulder flexion AROM to 140* required for overhead reaching tasks. Baseline: Rt shoulder flexion 100* Goal status: IN PROGRESS  5.  Pt will be independent with HEP. Baseline:  Goal status: IN PROGRESS  6.  Pt will fold and put away basket of clothes independently. Baseline: Unable currently Goal status: IN PROGRESS  ASSESSMENT:  CLINICAL IMPRESSION: Pt demonstrates good tolerance to therapy today though requires education to complete activities correctly. Focus on R wrist ext during next visit maybe consider kinesio  taping.  PERFORMANCE DEFICITS: in functional skills including ADLs, IADLs, coordination, dexterity, sensation, ROM, strength, Fine motor control, Gross motor control, mobility, balance, body mechanics, endurance, and UE functional use, and psychosocial skills including interpersonal interactions and routines and behaviors.   IMPAIRMENTS: are limiting patient from ADLs, IADLs, leisure, social participation, and driving .   CO-MORBIDITIES: may have co-morbidities  that affects occupational performance. Patient will benefit from skilled OT to address above impairments and improve overall function.  REHAB POTENTIAL: Good   PLAN:  OT FREQUENCY: additional 1x/week to 1x every other week  OT DURATION: additional 6 weeks  PLANNED INTERVENTIONS: self care/ADL training, therapeutic exercise, therapeutic activity, neuromuscular re-education, passive range of motion, gait training, balance training, functional mobility training, electrical stimulation, ultrasound, moist heat, cryotherapy, patient/family education, cognitive remediation/compensation, visual/perceptual remediation/compensation, psychosocial skills training, energy conservation, coping strategies training, DME and/or AE instructions, and Re-evaluation  RECOMMENDED OTHER SERVICES: None  CONSULTED AND AGREED WITH PLAN OF CARE: Patient and family member/caregiver  PLAN FOR NEXT SESSION: R elbow and wrist ext (ball catching/KT tape); consider stirring of materials (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

## 2023-07-07 ENCOUNTER — Encounter: Payer: Medicare Other | Admitting: Occupational Therapy

## 2023-07-21 ENCOUNTER — Ambulatory Visit: Payer: Medicare Other | Admitting: Occupational Therapy

## 2023-07-21 DIAGNOSIS — M6281 Muscle weakness (generalized): Secondary | ICD-10-CM | POA: Diagnosis not present

## 2023-07-21 DIAGNOSIS — R278 Other lack of coordination: Secondary | ICD-10-CM

## 2023-07-21 DIAGNOSIS — R29898 Other symptoms and signs involving the musculoskeletal system: Secondary | ICD-10-CM

## 2023-07-21 DIAGNOSIS — M25611 Stiffness of right shoulder, not elsewhere classified: Secondary | ICD-10-CM

## 2023-07-21 NOTE — Therapy (Signed)
OUTPATIENT OCCUPATIONAL THERAPY NEURO TREATMENT NOTE  Patient Name: Ethan Boyd MRN: [id number] DOB:02/10/1956, 67 y.o., male Today's Date: 07/21/2023  PCP: Corwin Levins, MD REFERRING PROVIDER: Corwin Levins, MD  END OF SESSION:  OT End of Session - 07/21/23 1153     Visit Number 14    Number of Visits 16    Authorization Type Medicare A&B / BCBS    Progress Note Due on Visit 16    OT Start Time 1152    OT Stop Time 1230    OT Time Calculation (min) 38 min    Activity Tolerance Patient tolerated treatment well    Behavior During Therapy [facility] for tasks assessed/performed               Past Medical History:  Diagnosis Date   DIABETES MELLITUS, TYPE II 02/18/2009   Qualifier: Diagnosis of  By: Jonny Ruiz MD, Len Blalock    Diverticulosis 08/31/2012   Colonoscopy, June 10, 2009, Dr Perry/GI   ERECTILE DYSFUNCTION 02/18/2009   Qualifier: Diagnosis of  By: Jonny Ruiz MD, Len Blalock    Erectile dysfunction 09/07/2012   HYPERLIPIDEMIA 02/18/2009   Qualifier: Diagnosis of  By: Jonny Ruiz MD, Len Blalock    HYPERTENSION 02/18/2009   Qualifier: Diagnosis of  By: Jonny Ruiz MD, Len Blalock    Past Surgical History:  Procedure Laterality Date   COLONOSCOPY  06/10/2009   right knee cartilage  12/26/1974   Patient Active Problem List   Diagnosis Date Noted   History of completed stroke 06/29/2023   Acute stroke due to ischemia (HCC) 03/28/2023   Right foot drop 03/28/2023   CVA (cerebral vascular accident) (HCC) 03/19/2023   Vitamin D deficiency 07/31/2022   COPD (chronic obstructive pulmonary disease) (HCC) 07/29/2022   Pulmonary nodule, left 08/09/2021   Abnormal physical evaluation 07/15/2021   Screen for colon cancer 07/15/2021   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   Type II diabetes mellitus with manifestations (HCC) 07/15/2021   Hyperlipidemia LDL goal <70 07/15/2021   Tachycardia 07/15/2021   PVC (premature ventricular contraction) 07/15/2021   Insulin dependent type 2 diabetes mellitus (HCC)  07/15/2021   Lung nodule 07/15/2021   EKG abnormality 09/07/2012   Erectile dysfunction 09/07/2012   Snoring 09/07/2012   Encounter for well adult exam with abnormal findings 08/31/2012   Diverticulosis 08/31/2012   Essential hypertension 02/18/2009    ONSET DATE: 03/31/2023 (referral date); 03/20/2023 onset date  REFERRING DIAG: R29.898 (ICD-10-CM) - Right arm weakness  THERAPY DIAG:  Muscle weakness (generalized)  Other lack of coordination  Right arm weakness  Stiffness of right shoulder, not elsewhere classified  Rationale for Evaluation and Treatment: Rehabilitation  SUBJECTIVE:   SUBJECTIVE STATEMENT:  Pt goes by "[name]."   He has some issues with R shoulder pain but these are more sporadic.   Pt accompanied by: self   PERTINENT HISTORY: HTN, COPD, insulin-dependent T2DM, HLD MRI brain was revealing for acute infarct of left pons during hospitalization in March. Repeat MRI brain 03/29/23 - IMPRESSION: Interval evolution of the now subacute infarct in the left paramedian pons. No new acute infarct or intracranial hemorrhage.  PRECAUTIONS: Fall  WEIGHT BEARING RESTRICTIONS: No  PAIN:  Are you having pain? Yes: NPRS scale: 2/10 Pain location: R shoulder  FALLS: Has patient fallen in last 6 months? No  LIVING ENVIRONMENT: Lives with: lives with their spouse Lives in: House/apartment - Two level house with bedroom and bathroom downstairs Stairs: Yes: Internal: 7 steps to landing then 7 more steps; left  hand rail for 7 steps, then handrail on both sides for second set and External: 4 steps; on left going up Has following equipment at home: Single point cane, Walker - 2 wheeled, Wheelchair (transport), and Grab bars Bathroom setup: upstairs bathroom walk-in shower with small threshold with grab bars and built-in shower seat. Regular height commode  PLOF:  ADLs independent, IADLs independent for what patient was responsible for, driving, retired Chief Operating Officer),  patient has two adult children ([name] and [name]) , enjoys golfing, ride around in truck to visit friends  PATIENT GOALS: Pt wants to be "back to where I was in February" with no limitations/weakness.   OBJECTIVE:   HAND DOMINANCE: Right, however patient using left hand recently  ADLs:  Eating: Difficulty cutting food, mild difficulty holding cups pending the weight (drinks from [facility] with straw to prevent spilling), slower to not spill Grooming: Brushing teeth with left mostly, slow on the right, not using q-tips currently UB Dressing: Difficulty with buttons, but otherwise independent LB Dressing: Independent Toileting: Independent, mostly using left hand for wiping Bathing: independent Tub Shower transfers: Independent Equipment: Grab bars and Walk in shower  IADLs: Shopping: Wife now doing grocery shopping where as pt would before. Concern for fatigue, balance, and overhead reaching Light housekeeping: Wife performing, but pt would share responsibility prior (goal to fold clothes) Meal Prep: Wife did most of the cooking before. Does not want to address Community mobility: Utilizing SPC 70% of the time. Pt does not use in the house, but if he leaves the house at all. Medication management: Independent, now have pop up tops.  Financial management: Independent Handwriting: 75% legible, improved gripping of pen as well per his report.  MOBILITY STATUS: Independent with SPC  POSTURE COMMENTS:  No Significant postural limitations Sitting balance:  Independent  ACTIVITY TOLERANCE: Activity tolerance: Pt reports he would force himself to get through every aisle of the grocery store, but it would be difficult.   FUNCTIONAL OUTCOME MEASURES: FOTO: Completed 4/23 score 61  UPPER EXTREMITY ROM:    Active ROM Right eval Left eval  Shoulder flexion 100* WFL  Shoulder abduction 110* WFL  Shoulder adduction    Shoulder extension    Shoulder internal rotation Hip ht [facility]  Shoulder  external rotation [facility]  Elbow flexion [facility] WFL  Elbow extension [facility] WFL  Wrist flexion 40* WFL  Wrist extension [facility] WFL  Wrist ulnar deviation    Wrist radial deviation    Wrist pronation [facility] WFL  Wrist supination WFL WFL  (Blank rows = not tested)  Grasp slightly worse of right compared to left  UPPER EXTREMITY MMT:     MMT Right eval Left eval  Shoulder flexion 4-   Shoulder abduction    Shoulder adduction    Shoulder extension 4-   Shoulder internal rotation    Shoulder external rotation    Middle trapezius    Lower trapezius    Elbow flexion 4-   Elbow extension 4-   Wrist flexion    Wrist extension    Wrist ulnar deviation    Wrist radial deviation    Wrist pronation    Wrist supination    (Blank rows = not tested)  Grasp 3+ right hand  HAND FUNCTION: Grip strength: Right: 40.7 lbs; Left: 79.3 lbs and Lateral pinch: Right: 8 lbs, Left: 10 lbs -04/28/23 3-pt pinch: Right 6lbs  COORDINATION: 9 Hole Peg test: Right: 85 sec; Left: 29 sec Box and Blocks:  Right 45 blocks,  Left 52 blocks - 04/28/23  SENSATION: WFL per patient report; however, may benefit from stereognosis  EDEMA: None  MUSCLE TONE: Appears to be WFL  COGNITION: Overall cognitive status: Within functional limits for tasks assessed  VISION: Subjective report: No vision changes noted Baseline vision: No visual deficits  VISION ASSESSMENT: Not tested  Patient has difficulty with following activities due to following visual impairments: None  PERCEPTION: WFL  PRAXIS: WFL  OBSERVATIONS: Pt able to walk from lobby area to table utilizing SPC. No loss of balance. Pt frequently utilizing left hand over right, although notes being right handed. Pt well kept and has supportive spouse.  04/28/23: Pt ambulatory with SPC, no loss of balance, appears to hike hip a bit. Pt well-kept. Wife present in lobby, but did not join patient.  05/10/13: Similar appearance with ambulation SPC. 05/19/23: No  changes 05/23/23: Pt ambulatory without device today.   TODAY'S TREATMENT:                                                                                                                              - Therapeutic exercises completed for duration as noted below including:  Pt utilized UE ranger at wall with RUE for closed chain ROM at shoulder level to flex, arc, and horizontal abduction and adduction.   With use of R, pt completed toss and catch of  2, 4, 6 lb weighted ball at rebounder for proprioceptive input as needed to tolerate impact and force through this extremity.   Patient completed serratus anterior roll standing against wall using foam roller for stretch of tight muscles to promote ROM and strength of affected RUE. Patient cued to move arms in a "Y" pattern while moving arms up and down the wall x 4 minutes   - Self-care/home management completed for duration as noted below including: Kinesio tape to R dorsal wrist and forearm using mechanical technique with 40-60% stretch to facilitate wrist extension. Pt verbalized understanding of tape and wearing schedule with application on affected extremity.  OT educated patient on activities to complete to facilitate wrist ext including wall push-ups and item pick up.   PATIENT EDUCATION: Education details: R strengthening and stretching; KT tape Person educated: Patient Education method: Explanation, Demonstration, Tactile cues, and Verbal cues Education comprehension: verbalized understanding, returned demonstration, and verbal cues required  HOME EXERCISE PROGRAM: 04/28/23: Access Code: [email or web address] URL: [email or web address] Date: 04/28/2023 Prepared by: Madison Reep  05/11/23: Access Code: [email or web address] URL: [email or web address]  05/12/23: None 05/16/23: None 05/19/23: Access Code: [email or web address] URL: [email or web address] 06/23/2023: side sleeper stretch 07/04/2023: Access Code: [email or web address] URL:  [email or web address]  GOALS: Goals reviewed with patient? Yes  SHORT TERM GOALS: Target date: 05/16/2023   Pt will demonstrate improved ease with fastening buttons as evidenced by decreasing 3 button/unbutton time by TBD seconds. Baseline: Wife assists with polo shirts, need to address time it takes to complete Goal status: IN PROGRESS  2.  Pt will demonstrate improved fine motor coordination  for ADLs as evidenced by decreasing 9 hole peg test score for Rt hand by 10 secs. Baseline: Rt - 85 sec 05/23/23: Rt - 32 secs Goal status: MET  3.  Pt will improve Rt grip strength by >/=20 lbs next visit as needed for daily activity. Baseline: Will test next visit 04/28/23: Rt 40.7 lbs 05/23/23: Rt 51.8 lbs Goal status: MET  4.  Pt will improve Rt shoulder flexion AROM to 120* required for overhead reaching tasks. Baseline: Rt shoulder flexion 100* 06/05/2023: 122* Goal status: MET  5.  Pt will improve handwriting legibility to 100% in order to write checks. Baseline: 75% legible 06/05/2023 - 100% legible Goal status: MET  6.  Pt will complete functional activity task in standing for up to 10 minutes without therapeutic rest break to simulate shopping tasks. Baseline: Unable currently for grocery shopping Goal status: IN PROGRESS  LONG TERM GOALS: Target date: 06/13/2023  Pt will complete FOTO assessment at time of discharge scoring 73 or greater indicating functional progression with ADL and IADL completion. Baseline: 61 Goal status: IN PROGRESS  2.  Pt will demonstrate improved fine motor coordination for ADLs as evidenced by decreasing 9 hole peg test score for Rt hand by 25 secs Baseline: Rt - 85 sec 05/23/23: Rt - 32 secs Goal status: MET  3.  Pt will be able to place at least 50 blocks using Rt hand with completion of Box and Blocks test. Baseline: Will test next visit 04/28/23: Rt 45 blocks 05/23/23: Rt - 45 Goal status: IN PROGRESS  4.  Pt will improve Rt  shoulder flexion AROM to 140* required for overhead reaching tasks. Baseline: Rt shoulder flexion 100* Goal status: IN PROGRESS  5.  Pt will be independent with HEP. Baseline:  Goal status: IN PROGRESS  6.  Pt will fold and put away basket of clothes independently. Baseline: Unable currently Goal status: IN PROGRESS  ASSESSMENT:  CLINICAL IMPRESSION: Pt demonstrates good understanding of exercises to further strengthen and improve ROM; however, remains limited by tolerance to quick sudden movements of RUE. R scapular rhythm is impaired compared to L and will require addressing further.   PERFORMANCE DEFICITS: in functional skills including ADLs, IADLs, coordination, dexterity, sensation, ROM, strength, Fine motor control, Gross motor control, mobility, balance, body mechanics, endurance, and UE functional use, and psychosocial skills including interpersonal interactions and routines and behaviors.   IMPAIRMENTS: are limiting patient from ADLs, IADLs, leisure, social participation, and driving .   CO-MORBIDITIES: may have co-morbidities  that affects occupational performance. Patient will benefit from skilled OT to address above impairments and improve overall function.  REHAB POTENTIAL: Good   PLAN:  OT FREQUENCY: additional 1x/week to 1x every other week  OT DURATION: additional 6 weeks  PLANNED INTERVENTIONS: self care/ADL training, therapeutic exercise, therapeutic activity, neuromuscular re-education, passive range of motion, gait training, balance training, functional mobility training, electrical stimulation, ultrasound, moist heat, cryotherapy, patient/family education, cognitive remediation/compensation, visual/perceptual remediation/compensation, psychosocial skills training, energy conservation, coping strategies training, DME and/or AE instructions, and Re-evaluation  RECOMMENDED OTHER SERVICES: None  CONSULTED AND AGREED WITH PLAN OF CARE: Patient and family  member/caregiver  PLAN FOR NEXT SESSION: R elbow and wrist ext (ball catching/KT tape ???); consider stirring of materials (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

## 2023-08-01 ENCOUNTER — Other Ambulatory Visit (INDEPENDENT_AMBULATORY_CARE_PROVIDER_SITE_OTHER): Payer: Medicare Other

## 2023-08-01 DIAGNOSIS — E118 Type 2 diabetes mellitus with unspecified complications: Secondary | ICD-10-CM | POA: Diagnosis not present

## 2023-08-01 LAB — COMPREHENSIVE METABOLIC PANEL
ALT: 22 U/L (ref 0–53)
AST: 17 U/L (ref 0–37)
Albumin: 4.4 g/dL (ref 3.5–5.2)
Alkaline Phosphatase: 92 U/L (ref 39–117)
BUN: 14 mg/dL (ref 6–23)
CO2: 25 mEq/L (ref 19–32)
Calcium: 9.2 mg/dL (ref 8.4–10.5)
Chloride: 98 mEq/L (ref 96–112)
Creatinine, Ser: 0.91 mg/dL (ref 0.40–1.50)
GFR: 87.67 mL/min (ref >60.00)
Glucose, Bld: 283 mg/dL — ABNORMAL HIGH (ref 70–99)
Potassium: 4.2 mEq/L (ref 3.5–5.1)
Sodium: 136 mEq/L (ref 135–145)
Total Bilirubin: 0.5 mg/dL (ref 0.2–1.2)
Total Protein: 7.1 g/dL (ref 6.0–8.3)

## 2023-08-01 LAB — LIPID PANEL
Cholesterol: 158 mg/dL (ref 0–200)
HDL: 71.4 mg/dL (ref >39.00)
LDL Cholesterol: 71 mg/dL (ref 0–99)
NonHDL: 86.74
Total CHOL/HDL Ratio: 2
Triglycerides: 78 mg/dL (ref 0.0–149.0)
VLDL: 15.6 mg/dL (ref 0.0–40.0)

## 2023-08-01 LAB — HEMOGLOBIN A1C: Hgb A1c MFr Bld: 7.2 % — ABNORMAL HIGH (ref 4.6–6.5)

## 2023-08-01 LAB — MICROALBUMIN / CREATININE URINE RATIO
Creatinine,U: 27.8 mg/dL
Microalb Creat Ratio: 56.7 mg/g — ABNORMAL HIGH (ref 0.0–30.0)
Microalb, Ur: 15.8 mg/dL — ABNORMAL HIGH (ref 0.0–1.9)

## 2023-08-10 ENCOUNTER — Encounter: Payer: Self-pay | Admitting: "Endocrinology

## 2023-08-10 ENCOUNTER — Ambulatory Visit (INDEPENDENT_AMBULATORY_CARE_PROVIDER_SITE_OTHER): Payer: Medicare Other | Admitting: "Endocrinology

## 2023-08-10 VITALS — BP 140/75 | HR 120 | Ht 73.0 in | Wt 170.2 lb

## 2023-08-10 DIAGNOSIS — E7800 Pure hypercholesterolemia, unspecified: Secondary | ICD-10-CM

## 2023-08-10 DIAGNOSIS — Z7984 Long term (current) use of oral hypoglycemic drugs: Secondary | ICD-10-CM | POA: Diagnosis not present

## 2023-08-10 DIAGNOSIS — E1165 Type 2 diabetes mellitus with hyperglycemia: Secondary | ICD-10-CM

## 2023-08-10 MED ORDER — LISINOPRIL 2.5 MG PO TABS: 2.5000 mg | ORAL_TABLET | Freq: Every day | ORAL | 1 refills | Status: DC

## 2023-08-10 MED ORDER — DAPAGLIFLOZIN PROPANEDIOL 10 MG PO TABS: 10.0000 mg | ORAL_TABLET | Freq: Every day | ORAL | 1 refills | Status: DC

## 2023-08-10 NOTE — Progress Notes (Signed)
Outpatient Endocrinology Note Ethan Concord, MD  08/10/23   Ethan Boyd 25-Dec-1956 [id number]  Referring Provider: Corwin Levins, MD Primary Care Provider: Corwin Levins, MD Reason for consultation: Subjective   Assessment & Plan  Diagnoses and all orders for this visit:  Uncontrolled type 2 diabetes mellitus with hyperglycemia (HCC) -     Ambulatory referral to Podiatry  Long term (current) use of oral hypoglycemic drugs  Pure hypercholesterolemia  Other orders -     lisinopril (ZESTRIL) 2.5 MG tablet; Take 1 tablet (2.5 mg total) by mouth daily. -     dapagliflozin propanediol (FARXIGA) 10 MG TABS tablet; Take 1 tablet (10 mg total) by mouth daily before breakfast.    Diabetes Type II complicated by neuropathy, stroke  Lab Results  Component Value Date   GFR 87.67 08/01/2023   Hba1c goal less than 7, current Hba1c is  Lab Results  Component Value Date   HGBA1C 7.2 (H) 08/01/2023   Will recommend the following: Farxiga 10 mg every day Metformin XR 750 mg 2 tabs a day  No known contraindications/side effects to any of above medications  -Last LD and Tg are as follows: Lab Results  Component Value Date   LDLCALC 71 08/01/2023    Lab Results  Component Value Date   TRIG 78.0 08/01/2023   -On rosuvastatin 40 mg QD -Follow low fat diet and exercise   -Blood pressure goal <140/90 - Microalbumin/creatinine goal is < 30 -Last MA/Cr is as follows: Lab Results  Component Value Date   MICROALBUR 15.8 (H) 08/01/2023   -not on ACE/ARB  -diet changes including salt restriction -limit eating outside -counseled BP targets per standards of diabetes care -uncontrolled blood pressure can lead to retinopathy, nephropathy and cardiovascular and atherosclerotic heart disease  Reviewed and counseled on: -A1C target -Blood sugar targets -Complications of uncontrolled diabetes  -Checking blood sugar before meals and bedtime and bring log next visit -All  medications with mechanism of action and side effects -Hypoglycemia management: rule of 15's, Glucagon Emergency Kit and medical alert ID -low-carb low-fat plate-method diet -At least 20 minutes of physical activity per day -Annual dilated retinal eye exam and foot exam -compliance and follow up needs -follow up as scheduled or earlier if problem gets worse  Call if blood sugar is less than 70 or consistently above 250    Take a 15 gm snack of carbohydrate at bedtime before you go to sleep if your blood sugar is less than 100.    If you are going to fast after midnight for a test or procedure, ask your physician for instructions on how to reduce/decrease your insulin dose.    Call if blood sugar is less than 70 or consistently above 250  -Treating a low sugar by rule of 15  (15 gms of sugar every 15 min until sugar is more than 70) If you feel your sugar is low, test your sugar to be sure If your sugar is low (less than 70), then take 15 grams of a fast acting Carbohydrate (3-4 glucose tablets or glucose gel or 4 ounces of juice or regular soda) Recheck your sugar 15 min after treating low to make sure it is more than 70 If sugar is still less than 70, treat again with 15 grams of carbohydrate          Don't drive the hour of hypoglycemia  If unconscious/unable to eat or drink by mouth, use glucagon injection or nasal spray  baqsimi and call 911. Can repeat again in 15 min if still unconscious.  Return in about 3 months (around 11/10/2023).   I have reviewed current medications, nurse's notes, allergies, vital signs, past medical and surgical history, family medical history, and social history for this encounter. Counseled patient on symptoms, examination findings, lab findings, imaging results, treatment decisions and monitoring and prognosis. The patient understood the recommendations and agrees with the treatment plan. All questions regarding treatment plan were fully answered.  Ethan [place], MD  08/10/23    History of Present Illness Ethan Boyd is a 67 y.o. year old male who presents for evaluation of Type II diabetes mellitus.  Ethan Boyd was first diagnosed around 2009.   Diabetes education +  Home diabetes regimen: Farxiga 10 mg every day Metformin XR 750 mg 2 tabs a day  Stopped Lantus 8 units every day   COMPLICATIONS -  MI/+Stroke -  retinopathy +  neuropathy +  nephropathy  SYMPTOMS REVIEWED - Polyuria - Weight loss - Blurred vision  BLOOD SUGAR DATA Checks fasting 111-170 per meter    Physical Exam  BP (!) 140/75   Pulse (!) 120   Ht 6\' 1"  (1.854 m)   Wt 170 lb 3.2 oz (77.2 kg)   SpO2 97%   BMI 22.46 kg/m    Constitutional: well developed, well nourished Head: normocephalic, atraumatic Eyes: sclera anicteric, no redness Neck: supple Lungs: normal respiratory effort Neurology: alert and oriented Skin: dry, no appreciable rashes Musculoskeletal: no appreciable defects Psychiatric: normal mood and affect Diabetic Foot Exam - Simple   Simple Foot Form Diabetic Foot exam was performed with the following findings: Yes 08/10/2023  2:28 PM  Visual Inspection No deformities, no ulcerations, no other skin breakdown bilaterally: Yes Sensation Testing Intact to touch and monofilament testing bilaterally: Yes Pulse Check Posterior Tibialis and Dorsalis pulse intact bilaterally: Yes Comments B/L toe nails thick and yellow      Current Medications Patient's Medications  New Prescriptions   LISINOPRIL (ZESTRIL) 2.5 MG TABLET    Take 1 tablet (2.5 mg total) by mouth daily.  Previous Medications   AMLODIPINE (NORVASC) 5 MG TABLET    Take 1 tablet (5 mg total) by mouth daily.   BLOOD GLUCOSE MONITORING SUPPL DEVI    1 each by Does not apply route 3 (three) times daily. May dispense any manufacturer covered by patient's insurance.   CALCIUM CITRATE (CALCITRATE - DOSED IN MG ELEMENTAL CALCIUM) 950 (200 CA) MG TABLET    Take 200 mg  of elemental calcium by mouth daily.   CLOPIDOGREL (PLAVIX) 75 MG TABLET    Take 1 tablet (75 mg total) by mouth daily.   CONTINUOUS BLOOD GLUC RECEIVER (FREESTYLE LIBRE 2 READER) DEVI    1 Act by Does not apply route daily.   CONTINUOUS BLOOD GLUC SENSOR (FREESTYLE LIBRE 2 SENSOR) MISC    1 Act by Does not apply route daily.   GLUCOSE BLOOD (BLOOD GLUCOSE TEST STRIPS) STRP    1 each by Does not apply route 3 (three) times daily. Use as directed to check blood sugar. May dispense any manufacturer covered by patient's insurance and fits patient's device.   INSULIN GLARGINE (LANTUS) 100 UNIT/ML SOLOSTAR PEN    Inject 8 Units into the skin at bedtime.   INSULIN PEN NEEDLE (PEN NEEDLES) 31G X 5 MM MISC    1 each by Does not apply route 3 (three) times daily. May dispense any manufacturer covered by patient's insurance.  LANCET DEVICE MISC    1 each by Does not apply route 3 (three) times daily. May dispense any manufacturer covered by patient's insurance.   LANCETS MISC    1 each by Does not apply route 3 (three) times daily. Use as directed to check blood sugar. May dispense any manufacturer covered by patient's insurance and fits patient's device.   METFORMIN (GLUCOPHAGE XR) 750 MG 24 HR TABLET    Take 2 tablets (1,500 mg total) by mouth daily with breakfast.   MULTIPLE VITAMIN (MULTIVITAMIN WITH MINERALS) TABS TABLET    Take 1 tablet by mouth daily.   OMEGA-3 FATTY ACIDS (FISH OIL) 1200 MG CAPS    Take by mouth.   ROSUVASTATIN (CRESTOR) 40 MG TABLET    Take 1 tablet (40 mg total) by mouth daily.   STUDY - OCEANIC-STROKE - ASUNDEXIAN 50 MG OR PLACEBO TABLET (PI-SETHI)    Take 1 tablet (50 mg total) by mouth daily. For Investigational Use Only. Take at the same time each day (preferably in the morning). Tablet should be swallowed whole with water; it CANNOT be crushed or broken. Please contact [facility] for any questions or concerns regarding this medication.   VITAMIN D,  CHOLECALCIFEROL, 25 MCG (1000 UT) TABS    Take 1,000 Units by mouth daily at 8 pm.  Modified Medications   Modified Medication Previous Medication   DAPAGLIFLOZIN PROPANEDIOL (FARXIGA) 10 MG TABS TABLET dapagliflozin propanediol (FARXIGA) 10 MG TABS tablet      Take 1 tablet (10 mg total) by mouth daily before breakfast.    Take 1 tablet (10 mg total) by mouth daily before breakfast.  Discontinued Medications   No medications on file    Allergies Allergies  Allergen Reactions   Aspirin     REACTION: angioedema    Past Medical History Past Medical History:  Diagnosis Date   DIABETES MELLITUS, TYPE II 02/18/2009   Qualifier: Diagnosis of  By: Jonny Ruiz MD, Len Blalock    Diverticulosis 08/31/2012   Colonoscopy, June 10, 2009, Dr Perry/GI   ERECTILE DYSFUNCTION 02/18/2009   Qualifier: Diagnosis of  By: Jonny Ruiz MD, Len Blalock    Erectile dysfunction 09/07/2012   HYPERLIPIDEMIA 02/18/2009   Qualifier: Diagnosis of  By: Jonny Ruiz MD, Len Blalock    HYPERTENSION 02/18/2009   Qualifier: Diagnosis of  By: Jonny Ruiz MD, Len Blalock     Past Surgical History Past Surgical History:  Procedure Laterality Date   COLONOSCOPY  06/10/2009   right knee cartilage  12/26/1974    Family History family history includes Diabetes in an other family member.  Social History Social History   Socioeconomic History   Marital status: Married    Spouse name: Not on file   Number of children: Not on file   Years of education: Not on file   Highest education level: Bachelor's degree (e.g., BA, AB, BS)  Occupational History   Not on file  Tobacco Use   Smoking status: Never   Smokeless tobacco: Never  Vaping Use   Vaping status: Never Used  Substance and Sexual Activity   Alcohol use: Yes    Alcohol/week: 8.0 standard drinks of alcohol    Types: 8 Cans of beer per week   Drug use: No   Sexual activity: Yes    Partners: Female  Other Topics Concern   Not on file  Social History Narrative   Not on file   Social  Determinants of Health   Financial Resource Strain: Low Risk  (  03/27/2023)   Overall Financial Resource Strain (CARDIA)    Difficulty of Paying Living Expenses: Not very hard  Food Insecurity: No Food Insecurity (03/27/2023)   Hunger Vital Sign    Worried About Running Out of Food in the Last Year: Never true    Ran Out of Food in the Last Year: Never true  Transportation Needs: No Transportation Needs (03/27/2023)   PRAPARE - Administrator, Civil Service (Medical): No    Lack of Transportation (Non-Medical): No  Physical Activity: Insufficiently Active (03/27/2023)   Exercise Vital Sign    Days of Exercise per Week: 1 day    Minutes of Exercise per Session: 30 min  Stress: No Stress Concern Present (03/27/2023)   [employer] of Occupational Health - Occupational Stress Questionnaire    Feeling of Stress : Only a little  Social Connections: Unknown (03/27/2023)   Social Connection and Isolation Panel [NHANES]    Frequency of Communication with Friends and Family: More than three times a week    Frequency of Social Gatherings with Friends and Family: Once a week    Attends Religious Services: Patient declined    Active Member of Clubs or Organizations: Patient declined    Attends Banker Meetings: Not on file    Marital Status: Married  Intimate Partner Violence: Not on file    Lab Results  Component Value Date   HGBA1C 7.2 (H) 08/01/2023   HGBA1C 6.6 (H) 06/27/2023   HGBA1C 9.4 (H) 03/19/2023   Lab Results  Component Value Date   CHOL 158 08/01/2023   Lab Results  Component Value Date   HDL 71.40 08/01/2023   Lab Results  Component Value Date   LDLCALC 71 08/01/2023   Lab Results  Component Value Date   TRIG 78.0 08/01/2023   Lab Results  Component Value Date   CHOLHDL 2 08/01/2023   Lab Results  Component Value Date   CREATININE 0.91 08/01/2023   Lab Results  Component Value Date   GFR 87.67 08/01/2023   Lab Results  Component  Value Date   MICROALBUR 15.8 (H) 08/01/2023      Component Value Date/Time   NA 136 08/01/2023 1105   K 4.2 08/01/2023 1105   CL 98 08/01/2023 1105   CO2 25 08/01/2023 1105   GLUCOSE 283 (H) 08/01/2023 1105   BUN 14 08/01/2023 1105   CREATININE 0.91 08/01/2023 1105   CALCIUM 9.2 08/01/2023 1105   PROT 7.1 08/01/2023 1105   ALBUMIN 4.4 08/01/2023 1105   AST 17 08/01/2023 1105   ALT 22 08/01/2023 1105   ALKPHOS 92 08/01/2023 1105   BILITOT 0.5 08/01/2023 1105   GFRNONAA >60 03/19/2023 1930   GFRAA 131 02/11/2009 1003      Latest Ref Rng & Units 08/01/2023   11:05 AM 06/27/2023    1:54 PM 03/19/2023    7:37 PM  BMP  Glucose 70 - 99 mg/dL 657  846  962   BUN 6 - 23 mg/dL 14  17  7    Creatinine 0.40 - 1.50 mg/dL 9.52  8.41  3.24   Sodium 135 - 145 mEq/L 136  140  130   Potassium 3.5 - 5.1 mEq/L 4.2  4.4  3.7   Chloride 96 - 112 mEq/L 98  99  90   CO2 19 - 32 mEq/L 25  30    Calcium 8.4 - 10.5 mg/dL 9.2  40.1         Component  Value Date/Time   WBC 8.4 06/27/2023 1354   RBC 4.95 06/27/2023 1354   HGB 15.3 06/27/2023 1354   HCT 45.9 06/27/2023 1354   PLT 259.0 06/27/2023 1354   MCV 92.6 06/27/2023 1354   MCH 32.4 03/19/2023 1930   MCHC 33.4 06/27/2023 1354   RDW 12.9 06/27/2023 1354   LYMPHSABS 1.4 06/27/2023 1354   MONOABS 0.9 06/27/2023 1354   EOSABS 0.1 06/27/2023 1354   BASOSABS 0.1 06/27/2023 1354     Parts of this note may have been dictated using voice recognition software. There may be variances in spelling and vocabulary which are unintentional. Not all errors are proofread. Please notify the There[facility] Parkin if any discrepancies are noted or if the meaning of any statement is not clear.

## 2023-08-10 NOTE — Patient Instructions (Signed)

## 2023-08-18 ENCOUNTER — Ambulatory Visit: Payer: Medicare Other | Attending: Internal Medicine | Admitting: Occupational Therapy

## 2023-08-18 DIAGNOSIS — M25611 Stiffness of right shoulder, not elsewhere classified: Secondary | ICD-10-CM | POA: Insufficient documentation

## 2023-08-18 DIAGNOSIS — R29898 Other symptoms and signs involving the musculoskeletal system: Secondary | ICD-10-CM | POA: Diagnosis present

## 2023-08-18 DIAGNOSIS — M6281 Muscle weakness (generalized): Secondary | ICD-10-CM | POA: Insufficient documentation

## 2023-08-18 DIAGNOSIS — R278 Other lack of coordination: Secondary | ICD-10-CM | POA: Insufficient documentation

## 2023-08-18 NOTE — Therapy (Signed)
OUTPATIENT OCCUPATIONAL THERAPY NEURO TREATMENT NOTE  Patient Name: Ethan Boyd MRN: [id number] DOB:17-Jul-1956, 67 y.o., male Today's Date: 08/18/2023  OCCUPATIONAL THERAPY DISCHARGE SUMMARY  Visits from Start of Care: 15  Current functional level related to goals / functional outcomes: Patient has met 6/6 short-term goals and 4/6 long-term goals to date.   Remaining deficits: No significant functional limitations.    Education / Equipment: Continue with RUE HEP following d/c as needed for further remediation efforts.    Patient agrees to discharge. Patient goals were met. Patient is being discharged due to being pleased with the current functional level.Marland Kitchen   PCP: Corwin Levins, MD REFERRING PROVIDER: Corwin Levins, MD  END OF SESSION:  OT End of Session - 08/18/23 1149     Visit Number 15    Number of Visits 16    Authorization Type Medicare A&B / BCBS    Progress Note Due on Visit 16    OT Start Time 1148    OT Stop Time 1228    OT Time Calculation (min) 40 min    Activity Tolerance Patient tolerated treatment well    Behavior During Therapy [facility] for tasks assessed/performed             Past Medical History:  Diagnosis Date   DIABETES MELLITUS, TYPE II 02/18/2009   Qualifier: Diagnosis of  By: Jonny Ruiz MD, Len Blalock    Diverticulosis 08/31/2012   Colonoscopy, June 10, 2009, Dr Perry/GI   ERECTILE DYSFUNCTION 02/18/2009   Qualifier: Diagnosis of  By: Jonny Ruiz MD, Len Blalock    Erectile dysfunction 09/07/2012   HYPERLIPIDEMIA 02/18/2009   Qualifier: Diagnosis of  By: Jonny Ruiz MD, Len Blalock    HYPERTENSION 02/18/2009   Qualifier: Diagnosis of  By: Jonny Ruiz MD, Len Blalock    Past Surgical History:  Procedure Laterality Date   COLONOSCOPY  06/10/2009   right knee cartilage  12/26/1974   Patient Active Problem List   Diagnosis Date Noted   History of completed stroke 06/29/2023   Acute stroke due to ischemia (HCC) 03/28/2023   Right foot drop 03/28/2023   CVA (cerebral vascular  accident) (HCC) 03/19/2023   Vitamin D deficiency 07/31/2022   COPD (chronic obstructive pulmonary disease) (HCC) 07/29/2022   Pulmonary nodule, left 08/09/2021   Abnormal physical evaluation 07/15/2021   Screen for colon cancer 07/15/2021   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   Type II diabetes mellitus with manifestations (HCC) 07/15/2021   Hyperlipidemia LDL goal <70 07/15/2021   Tachycardia 07/15/2021   PVC (premature ventricular contraction) 07/15/2021   Insulin dependent type 2 diabetes mellitus (HCC) 07/15/2021   Lung nodule 07/15/2021   EKG abnormality 09/07/2012   Erectile dysfunction 09/07/2012   Snoring 09/07/2012   Encounter for well adult exam with abnormal findings 08/31/2012   Diverticulosis 08/31/2012   Essential hypertension 02/18/2009    ONSET DATE: 03/31/2023 (referral date); 03/20/2023 onset date  REFERRING DIAG: R29.898 (ICD-10-CM) - Right arm weakness  THERAPY DIAG:  Muscle weakness (generalized)  Other lack of coordination  Right arm weakness  Stiffness of right shoulder, not elsewhere classified  Rationale for Evaluation and Treatment: Rehabilitation  SUBJECTIVE:   SUBJECTIVE STATEMENT:  Pt goes by "Ethan Boyd."   Episodes of R shoulder pain have pretty much stopped. He does not recall one in the last 3 weeks. He is comfortable with his RUE HEP.  Pt accompanied by: self   PERTINENT HISTORY: HTN, COPD, insulin-dependent T2DM, HLD MRI brain was revealing for acute infarct of left pons  during hospitalization in March. Repeat MRI brain 03/29/23 - IMPRESSION: Interval evolution of the now subacute infarct in the left paramedian pons. No new acute infarct or intracranial hemorrhage.  PRECAUTIONS: Fall  WEIGHT BEARING RESTRICTIONS: No  PAIN:  Are you having pain? No  FALLS: Has patient fallen in last 6 months? No  LIVING ENVIRONMENT: Lives with: lives with their spouse Lives in: House/apartment - Two level house with bedroom and bathroom  downstairs Stairs: Yes: Internal: 7 steps to landing then 7 more steps; left hand rail for 7 steps, then handrail on both sides for second set and External: 4 steps; on left going up Has following equipment at home: Single point cane, Walker - 2 wheeled, Wheelchair (transport), and Grab bars Bathroom setup: upstairs bathroom walk-in shower with small threshold with grab bars and built-in shower seat. Regular height commode  PLOF:  ADLs independent, IADLs independent for what patient was responsible for, driving, retired Chief Operating Officer), patient has two adult children ([name] and [name]) , enjoys golfing, ride around in truck to visit friends  PATIENT GOALS: Pt wants to be "back to where I was in February" with no limitations/weakness.   OBJECTIVE:   HAND DOMINANCE: Right, however patient using left hand recently  ADLs:  Eating: Difficulty cutting food, mild difficulty holding cups pending the weight (drinks from [facility] with straw to prevent spilling), slower to not spill Grooming: Brushing teeth with left mostly, slow on the right, not using q-tips currently UB Dressing: Difficulty with buttons, but otherwise independent LB Dressing: Independent Toileting: Independent, mostly using left hand for wiping Bathing: independent Tub Shower transfers: Independent Equipment: Grab bars and Walk in shower  IADLs: Shopping: Wife now doing grocery shopping where as pt would before. Concern for fatigue, balance, and overhead reaching Light housekeeping: Wife performing, but pt would share responsibility prior (goal to fold clothes) Meal Prep: Wife did most of the cooking before. Does not want to address Community mobility: Utilizing SPC 70% of the time. Pt does not use in the house, but if he leaves the house at all. Medication management: Independent, now have pop up tops.  Financial management: Independent Handwriting: 75% legible, improved gripping of pen as well per his report.  MOBILITY STATUS:  Independent with SPC  POSTURE COMMENTS:  No Significant postural limitations Sitting balance:  Independent  ACTIVITY TOLERANCE: Activity tolerance: Pt reports he would force himself to get through every aisle of the grocery store, but it would be difficult.   FUNCTIONAL OUTCOME MEASURES: FOTO: Completed 4/23 score 61  UPPER EXTREMITY ROM:    Active ROM Right eval Left eval Right 08/18/2023  Shoulder flexion 100* WFL 120*  Shoulder abduction 110* WFL 114*  Shoulder adduction     Shoulder extension     Shoulder internal rotation Hip ht [facility] WFL  Shoulder external rotation [facility] WFL  Elbow flexion [facility] WFL  Elbow extension [facility] WFL  Wrist flexion 40* WFL WFL  Wrist extension [facility] WFL 52*  Wrist ulnar deviation     Wrist radial deviation     Wrist pronation [facility] WFL  Wrist supination WFL WFL WFL  (Blank rows = not tested)  Grasp slightly worse of right compared to left  UPPER EXTREMITY MMT:     MMT Right eval Left eval  Shoulder flexion 4-   Shoulder abduction    Shoulder adduction    Shoulder extension 4-   Shoulder internal rotation    Shoulder external rotation    Middle trapezius  Lower trapezius    Elbow flexion 4-   Elbow extension 4-   Wrist flexion    Wrist extension    Wrist ulnar deviation    Wrist radial deviation    Wrist pronation    Wrist supination    (Blank rows = not tested)  Grasp 3+ right hand  HAND FUNCTION: Grip strength: Right: 40.7 lbs; Left: 79.3 lbs and Lateral pinch: Right: 8 lbs, Left: 10 lbs -04/28/23 3-pt pinch: Right 6lbs  COORDINATION: 9 Hole Peg test: Right: 85 sec; Left: 29 sec Box and Blocks:  Right 45 blocks, Left 52 blocks - 04/28/23  SENSATION: WFL per patient report; however, may benefit from stereognosis  EDEMA: None  MUSCLE TONE: Appears to be WFL  COGNITION: Overall cognitive status: Within functional limits for tasks assessed  VISION: Subjective report: No vision changes noted Baseline vision:  No visual deficits  VISION ASSESSMENT: Not tested  Patient has difficulty with following activities due to following visual impairments: None  PERCEPTION: WFL  PRAXIS: WFL  OBSERVATIONS: Pt able to walk from lobby area to table utilizing SPC. No loss of balance. Pt frequently utilizing left hand over right, although notes being right handed. Pt well kept and has supportive spouse.  04/28/23: Pt ambulatory with SPC, no loss of balance, appears to hike hip a bit. Pt well-kept. Wife present in lobby, but did not join patient.  05/10/13: Similar appearance with ambulation SPC. 05/19/23: No changes 05/23/23: Pt ambulatory without device today.   TODAY'S TREATMENT:                                                                                                                               - Self-care/home management completed for duration as noted below including: Therapist reviewed goals with patient and updated patient progression.  No additional functional limitations identified. Objective measures assessed as noted in Goals section to determine progression towards goals.  PATIENT EDUCATION: Education details:OT d/c; HEP Review Person educated: Patient Education method: Explanation Education comprehension: verbalized understanding and returned demonstration  HOME EXERCISE PROGRAM: 04/28/23: Access Code: [email or web address] URL: [email or web address] Date: 04/28/2023 Prepared by: Madison Reep  05/11/23: Access Code: [email or web address] URL: [email or web address]  05/12/23: None 05/16/23: None 05/19/23: Access Code: [email or web address] URL: [email or web address] 06/23/2023: side sleeper stretch 07/04/2023: Access Code: [email or web address] URL: [email or web address]  GOALS: Goals reviewed with patient? Yes  SHORT TERM GOALS: Target date: 05/16/2023   Pt will demonstrate improved ease with fastening buttons as evidenced by decreasing 3 button/unbutton time by TBD  seconds. Baseline: Wife assists with polo shirts, need to address time it takes to complete 08/18/2023: 31 sec; no significant functional limitation.  Goal status: MET  2.  Pt will demonstrate improved fine motor coordination for ADLs as evidenced by decreasing 9 hole peg test score for Rt hand by 10 secs. Baseline: Rt - 85 sec 05/23/23: Rt - 32 secs Goal status: MET  3.  Pt will improve Rt grip strength by >/=20 lbs  next visit as needed for daily activity. Baseline: Will test next visit 04/28/23: Rt 40.7 lbs 05/23/23: Rt 51.8 lbs Goal status: MET  4.  Pt will improve Rt shoulder flexion AROM to 120* required for overhead reaching tasks. Baseline: Rt shoulder flexion 100* 06/05/2023: 122* Goal status: MET  5.  Pt will improve handwriting legibility to 100% in order to write checks. Baseline: 75% legible 06/05/2023 - 100% legible Goal status: MET  6.  Pt will complete functional activity task in standing for up to 10 minutes without therapeutic rest break to simulate shopping tasks. Baseline: Unable currently for grocery shopping 08/18/2023: grocery shopping without difficulty Goal status: MET  LONG TERM GOALS: Target date: 06/13/2023  Pt will complete FOTO assessment at time of discharge scoring 73 or greater indicating functional progression with ADL and IADL completion. Baseline: 61 08/18/2023: 85 Goal status: MET  2.  Pt will demonstrate improved fine motor coordination for ADLs as evidenced by decreasing 9 hole peg test score for Rt hand by 25 secs Baseline: Rt - 85 sec 05/23/23: Rt - 32 secs Goal status: MET  3.  Pt will be able to place at least 50 blocks using Rt hand with completion of Box and Blocks test. Baseline: Will test next visit 04/28/23: Rt 45 blocks 05/23/23: Rt - 45 08/18/2023: 48 Goal status: IN PROGRESS  4.  Pt will improve Rt shoulder flexion AROM to 140* required for overhead reaching tasks. Baseline: Rt shoulder flexion 100* 08/18/2023: 120* Goal status:  IN PROGRESS  5.  Pt will be independent with HEP. Baseline:  Goal status: MET  6.  Pt will fold and put away basket of clothes independently. Baseline: Unable currently 08/18/2023: Completing independently Goal status: MET  ASSESSMENT:  CLINICAL IMPRESSION: Pt demonstrates good understanding of HEP as needed for further remediation efforts; however, RUE is [facility] for ROM, strength, and coordination as needed to participate in occupations as desired. Patient is appropriate for discharge and no longer demonstrates medical necessity for continued skilled occupational services.  PERFORMANCE DEFICITS: in functional skills including ADLs, IADLs, coordination, dexterity, sensation, ROM, strength, Fine motor control, Gross motor control, mobility, balance, body mechanics, endurance, and UE functional use, and psychosocial skills including interpersonal interactions and routines and behaviors.   IMPAIRMENTS: are limiting patient from ADLs, IADLs, leisure, social participation, and driving .   CO-MORBIDITIES: may have co-morbidities  that affects occupational performance. Patient will benefit from skilled OT to address above impairments and improve overall function.  REHAB POTENTIAL: Good   PLAN:  OT D/C Completed  Delana Meyer, OT 08/18/2023, 11:57 AM

## 2023-08-22 ENCOUNTER — Other Ambulatory Visit: Payer: Self-pay

## 2023-08-22 MED ORDER — BLOOD GLUCOSE TEST VI STRP: 1.0000 | ORAL_STRIP | Freq: Three times a day (TID) | 0 refills | Status: DC

## 2023-09-01 ENCOUNTER — Encounter: Payer: Medicare Other | Admitting: Occupational Therapy

## 2023-09-15 ENCOUNTER — Encounter: Payer: Medicare Other | Admitting: Occupational Therapy

## 2023-10-10 ENCOUNTER — Encounter: Payer: Self-pay | Admitting: Internal Medicine

## 2023-10-11 NOTE — Telephone Encounter (Signed)
Ok done hardcopy to [employer]

## 2023-10-17 ENCOUNTER — Encounter: Payer: Self-pay | Admitting: Podiatry

## 2023-10-17 ENCOUNTER — Ambulatory Visit (INDEPENDENT_AMBULATORY_CARE_PROVIDER_SITE_OTHER): Payer: Medicare Other | Admitting: Podiatry

## 2023-10-17 DIAGNOSIS — Z7901 Long term (current) use of anticoagulants: Secondary | ICD-10-CM | POA: Diagnosis not present

## 2023-10-17 DIAGNOSIS — M79674 Pain in right toe(s): Secondary | ICD-10-CM

## 2023-10-17 DIAGNOSIS — B351 Tinea unguium: Secondary | ICD-10-CM

## 2023-10-17 DIAGNOSIS — E118 Type 2 diabetes mellitus with unspecified complications: Secondary | ICD-10-CM

## 2023-10-17 DIAGNOSIS — M79675 Pain in left toe(s): Secondary | ICD-10-CM

## 2023-10-17 NOTE — Progress Notes (Signed)
Subjective:  Patient ID: Ethan Boyd, male    DOB: 03/13/1956,   MRN: [id number]  Chief Complaint  Patient presents with   Nail Problem    Rm#22 Patient states need further treatment for for bilateral nail fungus.Patient states he has had the issues more then 1 year    67 y.o. male presents for concern as above. concern of thickened elongated and painful nails that are difficult to trim. Relates he has tried OTC treatments to help but nothing has. Was sent here by endocrinologist. Relates his wife trims his nails and would like to continue to have her trim. Relates burning and tingling in their feet. Patient is diabetic and last A1c was  Lab Results  Component Value Date   HGBA1C 7.2 (H) 08/01/2023   . He is also on plavix.   PCP:  Corwin Levins, MD    . Denies any other pedal complaints. Denies n/v/f/c.   Past Medical History:  Diagnosis Date   DIABETES MELLITUS, TYPE II 02/18/2009   Qualifier: Diagnosis of  By: Jonny Ruiz MD, Len Blalock    Diverticulosis 08/31/2012   Colonoscopy, June 10, 2009, Dr Perry/GI   ERECTILE DYSFUNCTION 02/18/2009   Qualifier: Diagnosis of  By: Jonny Ruiz MD, Len Blalock    Erectile dysfunction 09/07/2012   HYPERLIPIDEMIA 02/18/2009   Qualifier: Diagnosis of  By: Jonny Ruiz MD, Len Blalock    HYPERTENSION 02/18/2009   Qualifier: Diagnosis of  By: Jonny Ruiz MD, Len Blalock     Objective:  Physical Exam: Vascular: DP/PT pulses 2/4 bilateral. CFT <3 seconds. Absent hair growth on digits. Edema noted to bilateral lower extremities. Xerosis noted bilaterally.  Skin. No lacerations or abrasions bilateral feet. Nails 1-5 bilateral  are thickened discolored and elongated with subungual debris.  Musculoskeletal: MMT 5/5 bilateral lower extremities in DF, PF, Inversion and Eversion. Deceased ROM in DF of ankle joint.  Neurological: Sensation intact to light touch. Protective sensation diminished bilateral.    Assessment:   1. Onychomycosis   2. Type II diabetes mellitus with manifestations (HCC)    3. Chronic anticoagulation      Plan:  Patient was evaluated and treated and all questions answered. -Examined patient -Discussed treatment options for painful dystrophic nails  -Clinical picture and Fungal culture was obtained by removing a portion of the hard nail itself from each of the involved toenails using a sterile nail nipper and sent to [facility] lab. Patient tolerated the biopsy procedure well without discomfort or need for anesthesia.  -Discussed fungal nail treatment options including oral, topical, and laser treatments.  -Discussed and educated patient on diabetic foot care, especially with  regards to the vascular, neurological and musculoskeletal systems.  -Stressed the importance of good glycemic control and the detriment of not  controlling glucose levels in relation to the foot. -Discussed supportive shoes at all times and checking feet regularly.  -Mechanically debrided all nails 1-5 bilateral using sterile nail nipper and filed with dremel without incident  -Answered all patient questions -Patient to return  in 3 months for at risk foot care but relates he would prefer to trim his own.  -Patient advised to call the office if any problems or questions arise in the meantime. -Patient to return in 4 weeks for follow up evaluation and discussion of fungal culture results or sooner if symptoms worsen.   Louann Sjogren, DPM

## 2023-10-17 NOTE — Progress Notes (Signed)
cultur 

## 2023-11-02 ENCOUNTER — Other Ambulatory Visit: Payer: Self-pay | Admitting: Podiatry

## 2023-11-15 ENCOUNTER — Encounter: Payer: Self-pay | Admitting: "Endocrinology

## 2023-11-15 ENCOUNTER — Ambulatory Visit (INDEPENDENT_AMBULATORY_CARE_PROVIDER_SITE_OTHER): Payer: Medicare Other | Admitting: "Endocrinology

## 2023-11-15 VITALS — BP 120/70 | HR 102 | Ht 73.0 in | Wt 168.8 lb

## 2023-11-15 DIAGNOSIS — E1165 Type 2 diabetes mellitus with hyperglycemia: Secondary | ICD-10-CM | POA: Diagnosis not present

## 2023-11-15 DIAGNOSIS — E7800 Pure hypercholesterolemia, unspecified: Secondary | ICD-10-CM

## 2023-11-15 DIAGNOSIS — Z7984 Long term (current) use of oral hypoglycemic drugs: Secondary | ICD-10-CM | POA: Diagnosis not present

## 2023-11-15 DIAGNOSIS — E1140 Type 2 diabetes mellitus with diabetic neuropathy, unspecified: Secondary | ICD-10-CM

## 2023-11-15 DIAGNOSIS — Z794 Long term (current) use of insulin: Secondary | ICD-10-CM | POA: Diagnosis not present

## 2023-11-15 LAB — POCT GLYCOSYLATED HEMOGLOBIN (HGB A1C): Hemoglobin A1C: 6.8 % — AB (ref 4.0–5.6)

## 2023-11-15 NOTE — Patient Instructions (Signed)

## 2023-11-15 NOTE — Progress Notes (Signed)
Outpatient Endocrinology Note Ethan Boyd , MD  11/15/23   Claris Pong [birth year] [id number]  Referring Provider: Corwin Levins, MD Primary Care Provider: Corwin Levins, MD Reason for consultation: Subjective   Assessment & Plan  Diagnoses and all orders for this visit:  Type 2 diabetes mellitus with diabetic neuropathy, without long-term current use of insulin (HCC) -     POCT glycosylated hemoglobin (Hb A1C)  Long term (current) use of oral hypoglycemic drugs  Pure hypercholesterolemia   Diabetes Type II complicated by neuropathy, stroke  Lab Results  Component Value Date   GFR 87.67 08/01/2023   Hba1c goal less than 7, current Hba1c is  Lab Results  Component Value Date   HGBA1C 6.8 (A) 11/15/2023   Will recommend the following: Farxiga 10 mg every day Metformin XR 750 mg 2 tabs a day  Doesn't want CGM No known contraindications/side effects to any of above medications  -Last LD and Tg are as follows: Lab Results  Component Value Date   LDLCALC 71 08/01/2023    Lab Results  Component Value Date   TRIG 78.0 08/01/2023   -On rosuvastatin 40 mg QD -Follow low fat diet and exercise   -Blood pressure goal <140/90 - Microalbumin/creatinine goal is < 30 -Last MA/Cr is as follows: Lab Results  Component Value Date   MICROALBUR 15.8 (H) 08/01/2023   -On ACE/ARB lisinopril 2.5 mg every day  -diet changes including salt restriction -limit eating outside -counseled BP targets per standards of diabetes care -uncontrolled blood pressure can lead to retinopathy, nephropathy and cardiovascular and atherosclerotic heart disease  Reviewed and counseled on: -A1C target -Blood sugar targets -Complications of uncontrolled diabetes  -Checking blood sugar before meals and bedtime and bring log next visit -All medications with mechanism of action and side effects -Hypoglycemia management: rule of 15's, Glucagon Emergency Kit and medical alert ID -low-carb  low-fat plate-method diet -At least 20 minutes of physical activity per day -Annual dilated retinal eye exam and foot exam -compliance and follow up needs -follow up as scheduled or earlier if problem gets worse  Call if blood sugar is less than 70 or consistently above 250    Take a 15 gm snack of carbohydrate at bedtime before you go to sleep if your blood sugar is less than 100.    If you are going to fast after midnight for a test or procedure, ask your physician for instructions on how to reduce/decrease your insulin dose.    Call if blood sugar is less than 70 or consistently above 250  -Treating a low sugar by rule of 15  (15 gms of sugar every 15 min until sugar is more than 70) If you feel your sugar is low, test your sugar to be sure If your sugar is low (less than 70), then take 15 grams of a fast acting Carbohydrate (3-4 glucose tablets or glucose gel or 4 ounces of juice or regular soda) Recheck your sugar 15 min after treating low to make sure it is more than 70 If sugar is still less than 70, treat again with 15 grams of carbohydrate          Don't drive the hour of hypoglycemia  If unconscious/unable to eat or drink by mouth, use glucagon injection or nasal spray baqsimi and call 911. Can repeat again in 15 min if still unconscious.  Return in about 3 months (around 02/15/2024).   I have reviewed current medications, nurse's notes, allergies, vital  signs, past medical and surgical history, family medical history, and social history for this encounter. Counseled patient on symptoms, examination findings, lab findings, imaging results, treatment decisions and monitoring and prognosis. The patient understood the recommendations and agrees with the treatment plan. All questions regarding treatment plan were fully answered.  Ethan Boyd Stanley, MD  11/15/23  History of Present Illness Ethan Boyd is a 67 y.o. year old male who presents for follow up of Type II diabetes  mellitus.  Ethan Boyd was first diagnosed around 2009.   Diabetes education +  Home diabetes regimen: Farxiga 10 mg every day Metformin XR 750 mg 2 tabs a day  Stopped Lantus 8 units every day   COMPLICATIONS -  MI/+Stroke -  retinopathy +  neuropathy +  nephropathy  BLOOD SUGAR DATA Checks different times every day  118-193 per log    Physical Exam  BP 120/70   Pulse (!) 102   Ht 6\' 1"  (1.854 m)   Wt 168 lb 12.8 oz (76.6 kg)   SpO2 97%   BMI 22.27 kg/m    Constitutional: well developed, well nourished Head: normocephalic, atraumatic Eyes: sclera anicteric, no redness Neck: supple Lungs: normal respiratory effort Neurology: alert and oriented Skin: dry, no appreciable rashes Musculoskeletal: no appreciable defects Psychiatric: normal mood and affect Diabetic Foot Exam - Simple   No data filed      Current Medications Patient's Medications  New Prescriptions   No medications on file  Previous Medications   AMLODIPINE (NORVASC) 5 MG TABLET    Take 1 tablet (5 mg total) by mouth daily.   BLOOD GLUCOSE MONITORING SUPPL DEVI    1 each by Does not apply route 3 (three) times daily. May dispense any manufacturer covered by patient's insurance.   CALCIUM CITRATE (CALCITRATE - DOSED IN MG ELEMENTAL CALCIUM) 950 (200 CA) MG TABLET    Take 200 mg of elemental calcium by mouth daily.   CLOPIDOGREL (PLAVIX) 75 MG TABLET    Take 1 tablet (75 mg total) by mouth daily.   DAPAGLIFLOZIN PROPANEDIOL (FARXIGA) 10 MG TABS TABLET    Take 1 tablet (10 mg total) by mouth daily before breakfast.   GLUCOSE BLOOD (BLOOD GLUCOSE TEST STRIPS) STRP    1 each by Does not apply route 3 (three) times daily. Use as directed to check blood sugar. May dispense any manufacturer covered by patient's insurance and fits patient's device.   INSULIN GLARGINE (LANTUS) 100 UNIT/ML SOLOSTAR PEN    Inject 8 Units into the skin at bedtime.   INSULIN PEN NEEDLE (PEN NEEDLES) 31G X 5 MM MISC    1 each by  Does not apply route 3 (three) times daily. May dispense any manufacturer covered by patient's insurance.   LANCET DEVICE MISC    1 each by Does not apply route 3 (three) times daily. May dispense any manufacturer covered by patient's insurance.   LANCETS MISC    1 each by Does not apply route 3 (three) times daily. Use as directed to check blood sugar. May dispense any manufacturer covered by patient's insurance and fits patient's device.   LISINOPRIL (ZESTRIL) 2.5 MG TABLET    Take 1 tablet (2.5 mg total) by mouth daily.   METFORMIN (GLUCOPHAGE XR) 750 MG 24 HR TABLET    Take 2 tablets (1,500 mg total) by mouth daily with breakfast.   MULTIPLE VITAMIN (MULTIVITAMIN WITH MINERALS) TABS TABLET    Take 1 tablet by mouth daily.   OMEGA-3 FATTY ACIDS (  FISH OIL) 1200 MG CAPS    Take by mouth.   ROSUVASTATIN (CRESTOR) 40 MG TABLET    Take 1 tablet (40 mg total) by mouth daily.   STUDY - OCEANIC-STROKE - ASUNDEXIAN 50 MG OR PLACEBO TABLET (PI-SETHI)    Take 1 tablet (50 mg total) by mouth daily. For Investigational Use Only. Take at the same time each day (preferably in the morning). Tablet should be swallowed whole with water; it CANNOT be crushed or broken. Please contact [facility] for any questions or concerns regarding this medication.   VITAMIN D, CHOLECALCIFEROL, 25 MCG (1000 UT) TABS    Take 1,000 Units by mouth daily at 8 pm.  Modified Medications   No medications on file  Discontinued Medications   CONTINUOUS BLOOD GLUC RECEIVER (FREESTYLE LIBRE 2 READER) DEVI    1 Act by Does not apply route daily.   CONTINUOUS BLOOD GLUC SENSOR (FREESTYLE LIBRE 2 SENSOR) MISC    1 Act by Does not apply route daily.    Allergies Allergies  Allergen Reactions   Aspirin     REACTION: angioedema    Past Medical History Past Medical History:  Diagnosis Date   DIABETES MELLITUS, TYPE II 02/18/2009   Qualifier: Diagnosis of  By: Jonny Ruiz MD, Len Blalock    Diverticulosis 08/31/2012   Colonoscopy,  June 10, 2009, Dr Perry/GI   ERECTILE DYSFUNCTION 02/18/2009   Qualifier: Diagnosis of  By: Jonny Ruiz MD, Len Blalock    Erectile dysfunction 09/07/2012   HYPERLIPIDEMIA 02/18/2009   Qualifier: Diagnosis of  By: Jonny Ruiz MD, Len Blalock    HYPERTENSION 02/18/2009   Qualifier: Diagnosis of  By: Jonny Ruiz MD, Len Blalock     Past Surgical History Past Surgical History:  Procedure Laterality Date   COLONOSCOPY  06/10/2009   right knee cartilage  12/26/1974    Family History family history includes Diabetes in an other family member.  Social History Social History   Socioeconomic History   Marital status: Married    Spouse name: Not on file   Number of children: Not on file   Years of education: Not on file   Highest education level: Bachelor's degree (e.g., BA, AB, BS)  Occupational History   Not on file  Tobacco Use   Smoking status: Never   Smokeless tobacco: Never  Vaping Use   Vaping status: Never Used  Substance and Sexual Activity   Alcohol use: Yes    Alcohol/week: 8.0 standard drinks of alcohol    Types: 8 Cans of beer per week   Drug use: No   Sexual activity: Yes    Partners: Female  Other Topics Concern   Not on file  Social History Narrative   Not on file   Social Determinants of Health   Financial Resource Strain: Low Risk  (03/27/2023)   Overall Financial Resource Strain (CARDIA)    Difficulty of Paying Living Expenses: Not very hard  Food Insecurity: No Food Insecurity (03/27/2023)   Hunger Vital Sign    Worried About Running Out of Food in the Last Year: Never true    Ran Out of Food in the Last Year: Never true  Transportation Needs: No Transportation Needs (03/27/2023)   PRAPARE - Administrator, Civil Service (Medical): No    Lack of Transportation (Non-Medical): No  Physical Activity: Insufficiently Active (03/27/2023)   Exercise Vital Sign    Days of Exercise per Week: 1 day    Minutes of Exercise per Session: 30  min  Stress: No Stress Concern Present  (03/27/2023)   [employer] of Occupational Health - Occupational Stress Questionnaire    Feeling of Stress : Only a little  Social Connections: Unknown (03/27/2023)   Social Connection and Isolation Panel [NHANES]    Frequency of Communication with Friends and Family: More than three times a week    Frequency of Social Gatherings with Friends and Family: Once a week    Attends Religious Services: Patient declined    Database administrator or Organizations: Patient declined    Attends Banker Meetings: Not on file    Marital Status: Married  Intimate Partner Violence: Not on file    Lab Results  Component Value Date   HGBA1C 6.8 (A) 11/15/2023   HGBA1C 7.2 (H) 08/01/2023   HGBA1C 6.6 (H) 06/27/2023   Lab Results  Component Value Date   CHOL 158 08/01/2023   Lab Results  Component Value Date   HDL 71.40 08/01/2023   Lab Results  Component Value Date   LDLCALC 71 08/01/2023   Lab Results  Component Value Date   TRIG 78.0 08/01/2023   Lab Results  Component Value Date   CHOLHDL 2 08/01/2023   Lab Results  Component Value Date   CREATININE 0.91 08/01/2023   Lab Results  Component Value Date   GFR 87.67 08/01/2023   Lab Results  Component Value Date   MICROALBUR 15.8 (H) 08/01/2023      Component Value Date/Time   NA 136 08/01/2023 1105   K 4.2 08/01/2023 1105   CL 98 08/01/2023 1105   CO2 25 08/01/2023 1105   GLUCOSE 283 (H) 08/01/2023 1105   BUN 14 08/01/2023 1105   CREATININE 0.91 08/01/2023 1105   CALCIUM 9.2 08/01/2023 1105   PROT 7.1 08/01/2023 1105   ALBUMIN 4.4 08/01/2023 1105   AST 17 08/01/2023 1105   ALT 22 08/01/2023 1105   ALKPHOS 92 08/01/2023 1105   BILITOT 0.5 08/01/2023 1105   GFRNONAA >60 03/19/2023 1930   GFRAA 131 02/11/2009 1003      Latest Ref Rng & Units 08/01/2023   11:05 AM 06/27/2023    1:54 PM 03/19/2023    7:37 PM  BMP  Glucose 70 - 99 mg/dL 401  027  253   BUN 6 - 23 mg/dL 14  17  7    Creatinine 0.40 -  1.50 mg/dL 6.64  4.03  4.74   Sodium 135 - 145 mEq/L 136  140  130   Potassium 3.5 - 5.1 mEq/L 4.2  4.4  3.7   Chloride 96 - 112 mEq/L 98  99  90   CO2 19 - 32 mEq/L 25  30    Calcium 8.4 - 10.5 mg/dL 9.2  25.9         Component Value Date/Time   WBC 8.4 06/27/2023 1354   RBC 4.95 06/27/2023 1354   HGB 15.3 06/27/2023 1354   HCT 45.9 06/27/2023 1354   PLT 259.0 06/27/2023 1354   MCV 92.6 06/27/2023 1354   MCH 32.4 03/19/2023 1930   MCHC 33.4 06/27/2023 1354   RDW 12.9 06/27/2023 1354   LYMPHSABS 1.4 06/27/2023 1354   MONOABS 0.9 06/27/2023 1354   EOSABS 0.1 06/27/2023 1354   BASOSABS 0.1 06/27/2023 1354     Parts of this note may have been dictated using voice recognition software. There may be variances in spelling and vocabulary which are unintentional. Not all errors are proofread. Please notify the Chartered loss adjuster  if any discrepancies are noted or if the meaning of any statement is not clear.

## 2024-01-17 ENCOUNTER — Telehealth: Payer: Self-pay | Admitting: Podiatry

## 2024-01-17 NOTE — Telephone Encounter (Signed)
Patient called and requested you give him a call in regards to some results from labs that he has had sone. He also mentioned that he was okay with taking an oral prescription, requesting you send it to the [place] pharmacy. Thanks

## 2024-01-31 ENCOUNTER — Encounter: Payer: Self-pay | Admitting: Podiatry

## 2024-01-31 ENCOUNTER — Ambulatory Visit (INDEPENDENT_AMBULATORY_CARE_PROVIDER_SITE_OTHER): Payer: Medicare Other | Admitting: Podiatry

## 2024-01-31 DIAGNOSIS — B351 Tinea unguium: Secondary | ICD-10-CM

## 2024-01-31 DIAGNOSIS — Z79899 Other long term (current) drug therapy: Secondary | ICD-10-CM | POA: Diagnosis not present

## 2024-01-31 MED ORDER — TERBINAFINE HCL 250 MG PO TABS: 250.0000 mg | ORAL_TABLET | Freq: Every day | ORAL | 0 refills | Status: AC

## 2024-01-31 NOTE — Progress Notes (Signed)
 Subjective:  Patient ID: Ethan Boyd, male    DOB: 1956-03-03,  MRN: [id number]  Chief Complaint  Patient presents with   Nail Problem    RM#13 Follow for treatment of nail fungus would  like to review results and nail trimming.    68 y.o. male presents with the above complaint.  Patient presents with thickened onychodystrophy mycotic toenails x 10 mild pain on palpation he states that he had his nails sent to [facility] and is here for the results.  He would like to discuss treatment options for nail fungus.  He was known to Dr. Sikora.  Denies any other acute issues.   Review of Systems: Negative except as noted in the HPI. Denies N/V/F/Ch.  Past Medical History:  Diagnosis Date   DIABETES MELLITUS, TYPE II 02/18/2009   Qualifier: Diagnosis of  By: Norleen MD, Lynwood ORN    Diverticulosis 08/31/2012   Colonoscopy, June 10, 2009, Dr Perry/GI   ERECTILE DYSFUNCTION 02/18/2009   Qualifier: Diagnosis of  By: Norleen MD, Lynwood ORN    Erectile dysfunction 09/07/2012   HYPERLIPIDEMIA 02/18/2009   Qualifier: Diagnosis of  By: Norleen MD, Lynwood ORN    HYPERTENSION 02/18/2009   Qualifier: Diagnosis of  By: Norleen MD, Lynwood ORN     Current Outpatient Medications:    amLODipine  (NORVASC ) 5 MG tablet, Take 1 tablet (5 mg total) by mouth daily., Disp: 90 tablet, Rfl: 3   Blood Glucose Monitoring Suppl DEVI, 1 each by Does not apply route 3 (three) times daily. May dispense any manufacturer covered by patient's insurance., Disp: 1 each, Rfl: 0   calcium  citrate (CALCITRATE - DOSED IN MG ELEMENTAL CALCIUM ) 950 (200 Ca) MG tablet, Take 200 mg of elemental calcium  by mouth daily., Disp: , Rfl:    clopidogrel  (PLAVIX ) 75 MG tablet, Take 1 tablet (75 mg total) by mouth daily., Disp: 90 tablet, Rfl: 3   dapagliflozin  propanediol (FARXIGA ) 10 MG TABS tablet, Take 1 tablet (10 mg total) by mouth daily before breakfast., Disp: 90 tablet, Rfl: 1   Glucose Blood (BLOOD GLUCOSE TEST STRIPS) STRP, 1 each by Does not apply route 3  (three) times daily. Use as directed to check blood sugar. May dispense any manufacturer covered by patient's insurance and fits patient's device., Disp: 100 strip, Rfl: 0   insulin  glargine (LANTUS ) 100 UNIT/ML Solostar Pen, Inject 8 Units into the skin at bedtime., Disp: 15 mL, Rfl: 0   Insulin  Pen Needle (PEN NEEDLES) 31G X 5 MM MISC, 1 each by Does not apply route 3 (three) times daily. May dispense any manufacturer covered by patient's insurance., Disp: 100 each, Rfl: 0   Lancet Device MISC, 1 each by Does not apply route 3 (three) times daily. May dispense any manufacturer covered by patient's insurance., Disp: 1 each, Rfl: 0   Lancets MISC, 1 each by Does not apply route 3 (three) times daily. Use as directed to check blood sugar. May dispense any manufacturer covered by patient's insurance and fits patient's device., Disp: 100 each, Rfl: 0   lisinopril  (ZESTRIL ) 2.5 MG tablet, Take 1 tablet (2.5 mg total) by mouth daily., Disp: 90 tablet, Rfl: 1   metFORMIN  (GLUCOPHAGE  XR) 750 MG 24 hr tablet, Take 2 tablets (1,500 mg total) by mouth daily with breakfast., Disp: 180 tablet, Rfl: 3   Multiple Vitamin (MULTIVITAMIN WITH MINERALS) TABS tablet, Take 1 tablet by mouth daily., Disp: , Rfl:    Omega-3 Fatty Acids (FISH OIL) 1200 MG CAPS, Take by mouth.,  Disp: , Rfl:    rosuvastatin  (CRESTOR ) 40 MG tablet, Take 1 tablet (40 mg total) by mouth daily., Disp: 90 tablet, Rfl: 3   Study - OCEANIC-STROKE - asundexian 50 mg or placebo tablet (PI-Sethi), Take 1 tablet (50 mg total) by mouth daily. For Investigational Use Only. Take at the same time each day (preferably in the morning). Tablet should be swallowed whole with water; it CANNOT be crushed or broken. Please contact [facility] for any questions or concerns regarding this medication., Disp: 98 tablet, Rfl: 0   terbinafine  (LAMISIL ) 250 MG tablet, Take 1 tablet (250 mg total) by mouth daily., Disp: 90 tablet, Rfl: 0   Vitamin D ,  Cholecalciferol, 25 MCG (1000 UT) TABS, Take 1,000 Units by mouth daily at 8 pm., Disp: , Rfl:   Social History   Tobacco Use  Smoking Status Never  Smokeless Tobacco Never    Allergies  Allergen Reactions   Aspirin     REACTION: angioedema   Objective:  There were no vitals filed for this visit. There is no height or weight on file to calculate BMI. Constitutional Well developed. Well nourished.  Vascular Dorsalis pedis pulses palpable bilaterally. Posterior tibial pulses palpable bilaterally. Capillary refill normal to all digits.  No cyanosis or clubbing noted. Pedal hair growth normal.  Neurologic Normal speech. Oriented to person, place, and time. Epicritic sensation to light touch grossly present bilaterally.  Dermatologic Nails thickened elongated dystrophic mycotic toenails x 10 mild pain on palpation Skin within normal meds  Orthopedic: Normal joint ROM without pain or crepitus bilaterally. No visible deformities. No bony tenderness.   Radiographs: None Assessment:   1. Onychomycosis   2. Encounter for eye exam due to high risk medication   3. Nail fungus    Plan:  Patient was evaluated and treated and all questions answered.  Bilateral toenails x 10 onychomycosis -Educated the patient on the etiology of onychomycosis and various treatment options associated with improving the fungal load.  I explained to the patient that there is 3 treatment options available to treat the onychomycosis including topical, p.o., laser treatment.  Patient elected to undergo p.o. options with Lamisil /terbinafine  therapy.  In order for me to start the medication therapy, I explained to the patient the importance of evaluating the liver and obtaining the liver function test.  Once the liver function test comes back normal I will start him on 50-month course of Lamisil  therapy.  Patient understood all risk and would like to proceed with Lamisil  therapy.  I have asked the patient to  immediately stop the Lamisil  therapy if she has any reactions to it and call the office or go to the emergency room right away.  Patient states understanding   No follow-ups on file.

## 2024-02-15 ENCOUNTER — Ambulatory Visit: Payer: Medicare Other | Admitting: "Endocrinology

## 2024-03-11 ENCOUNTER — Other Ambulatory Visit: Payer: Self-pay

## 2024-03-11 MED ORDER — DAPAGLIFLOZIN PROPANEDIOL 10 MG PO TABS: 10.0000 mg | ORAL_TABLET | Freq: Every day | ORAL | 1 refills | Status: DC

## 2024-03-14 ENCOUNTER — Other Ambulatory Visit: Payer: Self-pay | Admitting: Internal Medicine

## 2024-03-14 ENCOUNTER — Other Ambulatory Visit: Payer: Self-pay

## 2024-03-14 DIAGNOSIS — E1140 Type 2 diabetes mellitus with diabetic neuropathy, unspecified: Secondary | ICD-10-CM

## 2024-03-14 MED ORDER — LISINOPRIL 2.5 MG PO TABS: 2.5000 mg | ORAL_TABLET | Freq: Every day | ORAL | 1 refills | Status: DC

## 2024-03-14 NOTE — Telephone Encounter (Signed)
 Copied from CRM [id number]. Topic: Clinical - Medication Refill >> Mar 14, 2024  2:36 PM Theodis Sato wrote: Most Recent Primary Care Visit:  Provider: Corwin Levins  Department: [facility]  Visit Type: OFFICE VISIT  Date: 06/27/2023  Medication: Glucose Blood (BLOOD GLUCOSE TEST STRIPS) STRP  Has the patient contacted their pharmacy? Pharmacy calling to request this as they have received no update after multiple fax attempts.  (Agent: If no, request that the patient contact the pharmacy for the refill. If patient does not wish to contact the pharmacy document the reason why and proceed with request.) (Agent: If yes, when and what did the pharmacy advise?)  Is this the correct pharmacy for this prescription? Yes If no, delete pharmacy and type the correct one.  This is the patient's preferred pharmacy:  [facility] [place], [place] - [address] [facility] [address] [place] [address] Phone: [phone] Fax: [phone]   Has the prescription been filled recently? Yes  Is the patient out of the medication? Yes  Has the patient been seen for an appointment in the last year OR does the patient have an upcoming appointment? Yes  Can we respond through MyChart? N/A  Agent: Please be advised that Rx refills may take up to 3 business days. We ask that you follow-up with your pharmacy.

## 2024-03-15 MED ORDER — BLOOD GLUCOSE TEST VI STRP: 1.0000 | ORAL_STRIP | Freq: Three times a day (TID) | 3 refills | Status: AC

## 2024-03-25 ENCOUNTER — Other Ambulatory Visit (HOSPITAL_COMMUNITY): Payer: Self-pay | Admitting: Pharmacist

## 2024-03-25 MED ORDER — STUDY - OCEANIC-STROKE - ASUNDEXIAN 50 MG OR PLACEBO TABLET (PI-SETHI): 1.0000 | ORAL_TABLET | Freq: Every day | ORAL | 0 refills | Status: AC

## 2024-04-11 ENCOUNTER — Other Ambulatory Visit: Payer: Self-pay | Admitting: Internal Medicine

## 2024-04-15 ENCOUNTER — Other Ambulatory Visit: Payer: Self-pay

## 2024-04-15 ENCOUNTER — Ambulatory Visit: Admitting: "Endocrinology

## 2024-05-09 ENCOUNTER — Other Ambulatory Visit: Payer: Self-pay

## 2024-05-09 ENCOUNTER — Other Ambulatory Visit: Payer: Self-pay | Admitting: Internal Medicine

## 2024-06-27 ENCOUNTER — Encounter: Payer: Self-pay | Admitting: "Endocrinology

## 2024-06-27 ENCOUNTER — Ambulatory Visit (INDEPENDENT_AMBULATORY_CARE_PROVIDER_SITE_OTHER): Admitting: "Endocrinology

## 2024-06-27 VITALS — BP 142/74 | HR 98 | Ht 73.0 in | Wt 173.0 lb

## 2024-06-27 DIAGNOSIS — E7800 Pure hypercholesterolemia, unspecified: Secondary | ICD-10-CM

## 2024-06-27 DIAGNOSIS — E1140 Type 2 diabetes mellitus with diabetic neuropathy, unspecified: Secondary | ICD-10-CM

## 2024-06-27 DIAGNOSIS — Z7984 Long term (current) use of oral hypoglycemic drugs: Secondary | ICD-10-CM

## 2024-06-27 LAB — POCT GLYCOSYLATED HEMOGLOBIN (HGB A1C): Hemoglobin A1C: 7.1 % — AB (ref 4.0–5.6)

## 2024-06-27 NOTE — Progress Notes (Signed)
 Outpatient Endocrinology Note Ethan Birmingham, MD  06/27/24   Ethan Boyd 09-10-1956 [id number]  Referring Provider: Norleen Lynwood ORN, MD Primary Care Provider: Norleen Lynwood ORN, MD Reason for consultation: Subjective   Assessment & Plan  Diagnoses and all orders for this visit:  Type 2 diabetes mellitus with diabetic neuropathy, without long-term current use of insulin  (HCC) -     POCT glycosylated hemoglobin (Hb A1C) -     Microalbumin / creatinine urine ratio  Long term (current) use of oral hypoglycemic drugs  Pure hypercholesterolemia   Diabetes Type II complicated by neuropathy, stroke  Lab Results  Component Value Date   GFR 87.67 08/01/2023   Hba1c goal less than 7, current Hba1c is  Lab Results  Component Value Date   HGBA1C 7.1 (A) 06/27/2024   Will recommend the following: Farxiga  10 mg every day Metformin  XR 750 mg 2 tabs a day  Doesn't want CGM No known contraindications/side effects to any of above medications  -Last LD and Tg are as follows: Lab Results  Component Value Date   LDLCALC 71 08/01/2023    Lab Results  Component Value Date   TRIG 78.0 08/01/2023   -On rosuvastatin  40 mg QD -Follow low fat diet and exercise   -Blood pressure goal <140/90 - Microalbumin/creatinine goal is < 30 -Last MA/Cr is as follows: No results found for: MICROALBUR, [id number]  -On ACE/ARB lisinopril  2.5 mg every day  -diet changes including salt restriction -limit eating outside -counseled BP targets per standards of diabetes care -uncontrolled blood pressure can lead to retinopathy, nephropathy and cardiovascular and atherosclerotic heart disease  Reviewed and counseled on: -A1C target -Blood sugar targets -Complications of uncontrolled diabetes  -Checking blood sugar before meals and bedtime and bring log next visit -All medications with mechanism of action and side effects -Hypoglycemia management: rule of 15's, Glucagon Emergency Kit and  medical alert ID -low-carb low-fat plate-method diet -At least 20 minutes of physical activity per day -Annual dilated retinal eye exam and foot exam -compliance and follow up needs -follow up as scheduled or earlier if problem gets worse  Call if blood sugar is less than 70 or consistently above 250    Take a 15 gm snack of carbohydrate at bedtime before you go to sleep if your blood sugar is less than 100.    If you are going to fast after midnight for a test or procedure, ask your physician for instructions on how to reduce/decrease your insulin  dose.    Call if blood sugar is less than 70 or consistently above 250  -Treating a low sugar by rule of 15  (15 gms of sugar every 15 min until sugar is more than 70) If you feel your sugar is low, test your sugar to be sure If your sugar is low (less than 70), then take 15 grams of a fast acting Carbohydrate (3-4 glucose tablets or glucose gel or 4 ounces of juice or regular soda) Recheck your sugar 15 min after treating low to make sure it is more than 70 If sugar is still less than 70, treat again with 15 grams of carbohydrate          Don't drive the hour of hypoglycemia  If unconscious/unable to eat or drink by mouth, use glucagon injection or nasal spray baqsimi and call 911. Can repeat again in 15 min if still unconscious.  No follow-ups on file.   I have reviewed current medications, nurse's notes, allergies, vital  signs, past medical and surgical history, family medical history, and social history for this encounter. Counseled patient on symptoms, examination findings, lab findings, imaging results, treatment decisions and monitoring and prognosis. The patient understood the recommendations and agrees with the treatment plan. All questions regarding treatment plan were fully answered.  Ethan Birmingham, MD  06/27/24  History of Present Illness Ethan Boyd is a 68 y.o. year old male who presents for follow up of Type II diabetes  mellitus.  Ethan Boyd was first diagnosed around 2009.   Diabetes education +  Home diabetes regimen: Farxiga  10 mg every day Metformin  XR 750 mg 2 tabs a day  Stopped Lantus  8 units every day   COMPLICATIONS -  MI/+Stroke -  retinopathy +  neuropathy +  nephropathy  BLOOD SUGAR DATA Checks different times every day  134-198 per log    Physical Exam  BP (!) 142/74   Pulse 98   Ht 6' 1 (1.854 m)   Wt 173 lb (78.5 kg)   SpO2 98%   BMI 22.82 kg/m    Constitutional: well developed, well nourished Head: normocephalic, atraumatic Eyes: sclera anicteric, no redness Neck: supple Lungs: normal respiratory effort Neurology: alert and oriented Skin: dry, no appreciable rashes Musculoskeletal: no appreciable defects Psychiatric: normal mood and affect Diabetic Foot Exam - Simple   No data filed      Current Medications Patient's Medications  New Prescriptions   No medications on file  Previous Medications   AMLODIPINE  (NORVASC ) 5 MG TABLET    Take 1 tablet (5 mg total) by mouth daily.   BLOOD GLUCOSE MONITORING SUPPL DEVI    1 each by Does not apply route 3 (three) times daily. May dispense any manufacturer covered by patient's insurance.   CALCIUM  CITRATE (CALCITRATE - DOSED IN MG ELEMENTAL CALCIUM ) 950 (200 CA) MG TABLET    Take 200 mg of elemental calcium  by mouth daily.   CLOPIDOGREL  (PLAVIX ) 75 MG TABLET    Take 1 tablet (75 mg total) by mouth daily.   DAPAGLIFLOZIN  PROPANEDIOL (FARXIGA ) 10 MG TABS TABLET    Take 1 tablet (10 mg total) by mouth daily before breakfast.   GLUCOSE BLOOD (BLOOD GLUCOSE TEST STRIPS) STRP    1 each by Does not apply route 3 (three) times daily. Use as directed to check blood sugar. May dispense any manufacturer covered by patient's insurance and fits patient's device.   INSULIN  GLARGINE (LANTUS ) 100 UNIT/ML SOLOSTAR PEN    Inject 8 Units into the skin at bedtime.   INSULIN  PEN NEEDLE (PEN NEEDLES) 31G X 5 MM MISC    1 each by Does not  apply route 3 (three) times daily. May dispense any manufacturer covered by patient's insurance.   LANCET DEVICE MISC    1 each by Does not apply route 3 (three) times daily. May dispense any manufacturer covered by patient's insurance.   LANCETS MISC    1 each by Does not apply route 3 (three) times daily. Use as directed to check blood sugar. May dispense any manufacturer covered by patient's insurance and fits patient's device.   LISINOPRIL  (ZESTRIL ) 2.5 MG TABLET    Take 1 tablet (2.5 mg total) by mouth daily.   METFORMIN  (GLUCOPHAGE  XR) 750 MG 24 HR TABLET    Take 2 tablets (1,500 mg total) by mouth daily with breakfast.   MULTIPLE VITAMIN (MULTIVITAMIN WITH MINERALS) TABS TABLET    Take 1 tablet by mouth daily.   OMEGA-3 FATTY ACIDS (FISH OIL)  1200 MG CAPS    Take by mouth.   ROSUVASTATIN  (CRESTOR ) 40 MG TABLET    Take 1 tablet (40 mg total) by mouth daily.   STUDY - OCEANIC-STROKE - ASUNDEXIAN 50 MG OR PLACEBO TABLET (PI-SETHI)    Take 1 tablet (50 mg total) by mouth daily. For Investigational Use Only. Take at the same time each day (preferably in the morning). Tablet should be swallowed whole with water; it CANNOT be crushed or broken. Please contact [facility] for any questions or concerns regarding this medication.   TERBINAFINE  (LAMISIL ) 250 MG TABLET    Take 1 tablet (250 mg total) by mouth daily.   VITAMIN D , CHOLECALCIFEROL, 25 MCG (1000 UT) TABS    Take 1,000 Units by mouth daily at 8 pm.  Modified Medications   No medications on file  Discontinued Medications   No medications on file    Allergies Allergies  Allergen Reactions   Aspirin     REACTION: angioedema    Past Medical History Past Medical History:  Diagnosis Date   DIABETES MELLITUS, TYPE II 02/18/2009   Qualifier: Diagnosis of  By: Norleen MD, Lynwood ORN    Diverticulosis 08/31/2012   Colonoscopy, June 10, 2009, Dr Perry/GI   ERECTILE DYSFUNCTION 02/18/2009   Qualifier: Diagnosis of  By: Norleen MD, Lynwood ORN    Erectile dysfunction 09/07/2012   HYPERLIPIDEMIA 02/18/2009   Qualifier: Diagnosis of  By: Norleen MD, Lynwood ORN    HYPERTENSION 02/18/2009   Qualifier: Diagnosis of  By: Norleen MD, Lynwood ORN     Past Surgical History Past Surgical History:  Procedure Laterality Date   COLONOSCOPY  06/10/2009   right knee cartilage  12/26/1974    Family History family history includes Diabetes in an other family member.  Social History Social History   Socioeconomic History   Marital status: Married    Spouse name: Not on file   Number of children: Not on file   Years of education: Not on file   Highest education level: Bachelor's degree (e.g., BA, AB, BS)  Occupational History   Not on file  Tobacco Use   Smoking status: Never   Smokeless tobacco: Never  Vaping Use   Vaping status: Never Used  Substance and Sexual Activity   Alcohol use: Yes    Alcohol/week: 8.0 standard drinks of alcohol    Types: 8 Cans of beer per week   Drug use: No   Sexual activity: Yes    Partners: Female  Other Topics Concern   Not on file  Social History Narrative   Not on file   Social Drivers of Health   Financial Resource Strain: Low Risk  (03/27/2023)   Overall Financial Resource Strain (CARDIA)    Difficulty of Paying Living Expenses: Not very hard  Food Insecurity: No Food Insecurity (03/27/2023)   Hunger Vital Sign    Worried About Running Out of Food in the Last Year: Never true    Boyd Out of Food in the Last Year: Never true  Transportation Needs: No Transportation Needs (03/27/2023)   PRAPARE - Administrator, Civil Service (Medical): No    Lack of Transportation (Non-Medical): No  Physical Activity: Insufficiently Active (03/27/2023)   Exercise Vital Sign    Days of Exercise per Week: 1 day    Minutes of Exercise per Session: 30 min  Stress: No Stress Concern Present (03/27/2023)   [employer] of Occupational Health - Occupational Stress Questionnaire  Feeling of Stress : Only a  little  Social Connections: Unknown (03/27/2023)   Social Connection and Isolation Panel    Frequency of Communication with Friends and Family: More than three times a week    Frequency of Social Gatherings with Friends and Family: Once a week    Attends Religious Services: Patient declined    Database administrator or Organizations: Patient declined    Attends Banker Meetings: Not on file    Marital Status: Married  Intimate Partner Violence: Not on file    Lab Results  Component Value Date   HGBA1C 7.1 (A) 06/27/2024   HGBA1C 6.8 (A) 11/15/2023   HGBA1C 7.2 (H) 08/01/2023   Lab Results  Component Value Date   CHOL 158 08/01/2023   Lab Results  Component Value Date   HDL 71.40 08/01/2023   Lab Results  Component Value Date   LDLCALC 71 08/01/2023   Lab Results  Component Value Date   TRIG 78.0 08/01/2023   Lab Results  Component Value Date   CHOLHDL 2 08/01/2023   Lab Results  Component Value Date   CREATININE 0.91 08/01/2023   Lab Results  Component Value Date   GFR 87.67 08/01/2023   No results found for: MACKEY CURRENT     Component Value Date/Time   NA 136 08/01/2023 1105   K 4.2 08/01/2023 1105   CL 98 08/01/2023 1105   CO2 25 08/01/2023 1105   GLUCOSE 283 (H) 08/01/2023 1105   BUN 14 08/01/2023 1105   CREATININE 0.91 08/01/2023 1105   CALCIUM  9.2 08/01/2023 1105   PROT 7.1 08/01/2023 1105   ALBUMIN 4.4 08/01/2023 1105   AST 17 08/01/2023 1105   ALT 22 08/01/2023 1105   ALKPHOS 92 08/01/2023 1105   BILITOT 0.5 08/01/2023 1105   GFRNONAA >60 03/19/2023 1930   GFRAA 131 02/11/2009 1003      Latest Ref Rng & Units 08/01/2023   11:05 AM 06/27/2023    1:54 PM 03/19/2023    7:37 PM  BMP  Glucose 70 - 99 mg/dL 716  844  801   BUN 6 - 23 mg/dL 14  17  7    Creatinine 0.40 - 1.50 mg/dL 9.08  9.06  8.89   Sodium 135 - 145 mEq/L 136  140  130   Potassium 3.5 - 5.1 mEq/L 4.2  4.4  3.7   Chloride 96 - 112 mEq/L 98  99  90   CO2  19 - 32 mEq/L 25  30    Calcium  8.4 - 10.5 mg/dL 9.2  89.5         Component Value Date/Time   WBC 8.4 06/27/2023 1354   RBC 4.95 06/27/2023 1354   HGB 15.3 06/27/2023 1354   HCT 45.9 06/27/2023 1354   PLT 259.0 06/27/2023 1354   MCV 92.6 06/27/2023 1354   MCH 32.4 03/19/2023 1930   MCHC 33.4 06/27/2023 1354   RDW 12.9 06/27/2023 1354   LYMPHSABS 1.4 06/27/2023 1354   MONOABS 0.9 06/27/2023 1354   EOSABS 0.1 06/27/2023 1354   BASOSABS 0.1 06/27/2023 1354     Parts of this note may have been dictated using voice recognition software. There may be variances in spelling and vocabulary which are unintentional. Not all errors are proofread. Please notify the dino if any discrepancies are noted or if the meaning of any statement is not clear.

## 2024-07-01 ENCOUNTER — Ambulatory Visit

## 2024-07-02 ENCOUNTER — Ambulatory Visit: Admitting: Internal Medicine

## 2024-07-02 ENCOUNTER — Ambulatory Visit: Payer: Medicare Other | Admitting: Internal Medicine

## 2024-07-17 LAB — HM DIABETES EYE EXAM

## 2024-07-18 ENCOUNTER — Encounter: Payer: Self-pay | Admitting: Internal Medicine

## 2024-07-22 ENCOUNTER — Other Ambulatory Visit: Payer: Self-pay | Admitting: Internal Medicine

## 2024-07-22 DIAGNOSIS — Z794 Long term (current) use of insulin: Secondary | ICD-10-CM

## 2024-07-26 ENCOUNTER — Ambulatory Visit

## 2024-07-26 VITALS — Ht 73.0 in | Wt 173.0 lb

## 2024-07-26 DIAGNOSIS — Z0000 Encounter for general adult medical examination without abnormal findings: Secondary | ICD-10-CM | POA: Diagnosis not present

## 2024-07-26 DIAGNOSIS — E1140 Type 2 diabetes mellitus with diabetic neuropathy, unspecified: Secondary | ICD-10-CM | POA: Diagnosis not present

## 2024-07-26 NOTE — Progress Notes (Signed)
 Subjective:   Ethan Boyd is a 68 y.o. who presents for a Medicare Wellness preventive visit.  As a reminder, Annual Wellness Visits don't include a physical exam, and some assessments may be limited, especially if this visit is performed virtually. We may recommend an in-person follow-up visit with your provider if needed.  Visit Complete: Virtual I connected with  Ethan Boyd on 07/26/24 by a audio enabled telemedicine application and verified that I am speaking with the correct person using two identifiers.  Patient Location: Home  Provider Location: Office/Clinic  I discussed the limitations of evaluation and management by telemedicine. The patient expressed understanding and agreed to proceed.  Vital Signs: Because this visit was a virtual/telehealth visit, some criteria may be missing or patient reported. Any vitals not documented were not able to be obtained and vitals that have been documented are patient reported.  VideoDeclined- This patient declined Librarian, academic. Therefore the visit was completed with audio only.  Persons Participating in Visit: Patient.  AWV Questionnaire: No: Patient Medicare AWV questionnaire was not completed prior to this visit.  Cardiac Risk Factors include: advanced age (>51men, >39 women);diabetes mellitus;dyslipidemia;hypertension;Other (see comment), Risk factor comments: CVA     Objective:    Today's Vitals   07/26/24 1328  Weight: 173 lb (78.5 kg)  Height: 6' 1 (1.854 m)   Body mass index is 22.82 kg/m.     07/26/2024    1:50 PM 04/18/2023    1:22 PM 03/31/2023    2:05 PM 03/19/2023    6:16 PM 10/17/2018    9:54 AM  Advanced Directives  Does Patient Have a Medical Advance Directive? Yes Yes No No Yes   Type of Estate agent of [place];Living will Healthcare Power of [place];Living will   Living will  Does patient want to make changes to medical advance directive?  Yes  (Inpatient - patient defers changing a medical advance directive and declines information at this time)     Copy of Healthcare Power of Attorney in Chart? No - copy requested No - copy requested     Would patient like information on creating a medical advance directive?    No - Patient declined No - Patient declined      Data saved with a previous flowsheet row definition    Current Medications (verified) Outpatient Encounter Medications as of 07/26/2024  Medication Sig   amLODipine  (NORVASC ) 5 MG tablet Take 1 tablet (5 mg total) by mouth daily.   Blood Glucose Monitoring Suppl DEVI 1 each by Does not apply route 3 (three) times daily. May dispense any manufacturer covered by patient's insurance.   calcium  citrate (CALCITRATE - DOSED IN MG ELEMENTAL CALCIUM ) 950 (200 Ca) MG tablet Take 200 mg of elemental calcium  by mouth daily.   clopidogrel  (PLAVIX ) 75 MG tablet Take 1 tablet (75 mg total) by mouth daily.   dapagliflozin  propanediol (FARXIGA ) 10 MG TABS tablet Take 1 tablet (10 mg total) by mouth daily before breakfast.   Glucose Blood (BLOOD GLUCOSE TEST STRIPS) STRP 1 each by Does not apply route 3 (three) times daily. Use as directed to check blood sugar. May dispense any manufacturer covered by patient's insurance and fits patient's device.   insulin  glargine (LANTUS ) 100 UNIT/ML Solostar Pen Inject 8 Units into the skin at bedtime.   Insulin  Pen Needle (PEN NEEDLES) 31G X 5 MM MISC 1 each by Does not apply route 3 (three) times daily. May dispense any manufacturer covered by  patient's insurance.   Lancet Device MISC 1 each by Does not apply route 3 (three) times daily. May dispense any manufacturer covered by patient's insurance.   Lancets MISC 1 each by Does not apply route 3 (three) times daily. Use as directed to check blood sugar. May dispense any manufacturer covered by patient's insurance and fits patient's device.   lisinopril  (ZESTRIL ) 2.5 MG tablet Take 1 tablet (2.5 mg total) by  mouth daily.   metFORMIN  (GLUCOPHAGE -XR) 750 MG 24 hr tablet Take 2 tablets (1,500 mg total) by mouth daily with breakfast.   Multiple Vitamin (MULTIVITAMIN WITH MINERALS) TABS tablet Take 1 tablet by mouth daily.   Omega-3 Fatty Acids (FISH OIL) 1200 MG CAPS Take by mouth.   rosuvastatin  (CRESTOR ) 40 MG tablet Take 1 tablet (40 mg total) by mouth daily.   Study - OCEANIC-STROKE - asundexian 50 mg or placebo tablet (PI-Sethi) Take 1 tablet (50 mg total) by mouth daily. For Investigational Use Only. Take at the same time each day (preferably in the morning). Tablet should be swallowed whole with water; it CANNOT be crushed or broken. Please contact [facility] for any questions or concerns regarding this medication.   terbinafine  (LAMISIL ) 250 MG tablet Take 1 tablet (250 mg total) by mouth daily.   Vitamin D , Cholecalciferol, 25 MCG (1000 UT) TABS Take 1,000 Units by mouth daily at 8 pm.   No facility-administered encounter medications on file as of 07/26/2024.    Allergies (verified) Aspirin   History: Past Medical History:  Diagnosis Date   DIABETES MELLITUS, TYPE II 02/18/2009   Qualifier: Diagnosis of  By: Norleen MD, Lynwood ORN    Diverticulosis 08/31/2012   Colonoscopy, June 10, 2009, Dr Perry/GI   ERECTILE DYSFUNCTION 02/18/2009   Qualifier: Diagnosis of  By: Norleen MD, Lynwood ORN    Erectile dysfunction 09/07/2012   HYPERLIPIDEMIA 02/18/2009   Qualifier: Diagnosis of  By: Norleen MD, Lynwood ORN    HYPERTENSION 02/18/2009   Qualifier: Diagnosis of  By: Norleen MD, Lynwood ORN    Past Surgical History:  Procedure Laterality Date   COLONOSCOPY  06/10/2009   right knee cartilage  12/26/1974   Family History  Problem Relation Age of Onset   Diabetes Other    Colon cancer Neg Hx    Colon polyps Neg Hx    Esophageal cancer Neg Hx    Rectal cancer Neg Hx    Stomach cancer Neg Hx    Social History   Socioeconomic History   Marital status: Married    Spouse name: [name]   Number of  children: 2   Years of education: Not on file   Highest education level: Bachelor's degree (e.g., BA, AB, BS)  Occupational History   Occupation: RETIRED  Tobacco Use   Smoking status: Never   Smokeless tobacco: Never  Vaping Use   Vaping status: Never Used  Substance and Sexual Activity   Alcohol use: Yes    Alcohol/week: 8.0 standard drinks of alcohol    Types: 8 Cans of beer per week   Drug use: No   Sexual activity: Yes    Partners: Female  Other Topics Concern   Not on file  Social History Narrative   Lives with wife/2025   Social Drivers of Health   Financial Resource Strain: Low Risk  (07/26/2024)   Overall Financial Resource Strain (CARDIA)    Difficulty of Paying Living Expenses: Not hard at all  Food Insecurity: No Food Insecurity (07/26/2024)  Hunger Vital Sign    Worried About Running Out of Food in the Last Year: Never true    Ran Out of Food in the Last Year: Never true  Transportation Needs: No Transportation Needs (07/26/2024)   PRAPARE - Administrator, Civil Service (Medical): No    Lack of Transportation (Non-Medical): No  Physical Activity: Insufficiently Active (07/26/2024)   Exercise Vital Sign    Days of Exercise per Week: 3 days    Minutes of Exercise per Session: 30 min  Stress: No Stress Concern Present (07/26/2024)   [employer] of Occupational Health - Occupational Stress Questionnaire    Feeling of Stress: Not at all  Social Connections: Socially Integrated (07/26/2024)   Social Connection and Isolation Panel    Frequency of Communication with Friends and Family: Twice a week    Frequency of Social Gatherings with Friends and Family: Once a week    Attends Religious Services: More than 4 times per year    Active Member of [employer] or Organizations: Yes    Attends Banker Meetings: Never    Marital Status: Married    Tobacco Counseling Counseling given: Not Answered    Clinical Intake:  Pre-visit preparation  completed: Yes  Pain : No/denies pain     BMI - recorded: 22.82 Nutritional Status: BMI of 19-24  Normal Nutritional Risks: None Diabetes: Yes CBG done?: No Did pt. bring in CBG monitor from home?: No  Lab Results  Component Value Date   HGBA1C 7.1 (A) 06/27/2024   HGBA1C 6.8 (A) 11/15/2023   HGBA1C 7.2 (H) 08/01/2023     How often do you need to have someone help you when you read instructions, pamphlets, or other written materials from your doctor or pharmacy?: 1 - Never  Interpreter Needed?: No  Information entered by :: Zafiro Routson, RMA   Activities of Daily Living     07/26/2024    1:48 PM  In your present state of health, do you have any difficulty performing the following activities:  Hearing? 0  Vision? 0  Difficulty concentrating or making decisions? 0  Walking or climbing stairs? 0  Dressing or bathing? 0  Doing errands, shopping? 0  Preparing Food and eating ? N  Using the Toilet? N  In the past six months, have you accidently leaked urine? N  Do you have problems with loss of bowel control? N  Managing your Medications? N  Managing your Finances? N  Housekeeping or managing your Housekeeping? N    Patient Care Team: Norleen Lynwood ORN, MD as PCP - General  I have updated your Care Teams any recent Medical Services you may have received from other providers in the past year.     Assessment:   This is a routine wellness examination for Ethan Boyd.  Hearing/Vision screen Hearing Screening - Comments:: Denies hearing difficulties   Vision Screening - Comments:: Denies vision issues./ Dr. Charmayne    Goals Addressed   None    Depression Screen     07/26/2024    1:53 PM 06/27/2023    1:09 PM 03/28/2023    1:18 PM 07/29/2022    9:33 AM 07/15/2021    8:59 AM 01/16/2020    9:34 AM 03/23/2018    9:00 AM  PHQ 2/9 Scores  PHQ - 2 Score 0 0 0 0 0 0 0  PHQ- 9 Score 0  6 0   0    Fall Risk     07/26/2024  1:51 PM 06/27/2023    1:09 PM 03/28/2023    1:19 PM  07/29/2022    9:32 AM 07/15/2021    8:59 AM  Fall Risk   Falls in the past year? 0 0 0 0 0  Number falls in past yr: 0 0 0 0 0  Injury with Fall? 0 0 0 0 0  Risk for fall due to :  No Fall Risks No Fall Risks    Follow up Falls evaluation completed;Falls prevention discussed Falls evaluation completed Falls evaluation completed;Education provided      MEDICARE RISK AT HOME:  Medicare Risk at Home Any stairs in or around the home?: Yes If so, are there any without handrails?: No Home free of loose throw rugs in walkways, pet beds, electrical cords, etc?: Yes Adequate lighting in your home to reduce risk of falls?: Yes Life alert?: No Use of a cane, walker or w/c?: No Grab bars in the bathroom?: Yes Shower chair or bench in shower?: Yes Elevated toilet seat or a handicapped toilet?: Yes  TIMED UP AND GO:  Was the test performed?  No  Cognitive Function: Declined/Normal: No cognitive concerns noted by patient or family. Patient alert, oriented, able to answer questions appropriately and recall recent events. No signs of memory loss or confusion.        Immunizations Immunization History  Administered Date(s) Administered   Influenza Split 09/07/2012   Influenza Whole 09/25/2008   Influenza,inj,Quad PF,6+ Mos 11/09/2018, 09/17/2019   Influenza-Unspecified 09/25/2017   PFIZER(Purple Top)SARS-COV-2 Vaccination 03/30/2020, 04/21/2020   Td 02/18/2009   Tdap 01/15/2020, 01/16/2020   Zoster Recombinant(Shingrix) 10/01/2019, 12/23/2019    Screening Tests Health Maintenance  Topic Date Due   Medicare Annual Wellness (AWV)  Never done   Diabetic kidney evaluation - Urine ACR  Never done   Pneumococcal Vaccine: 50+ Years (1 of 2 - PCV) Never done   INFLUENZA VACCINE  07/26/2024   Diabetic kidney evaluation - eGFR measurement  07/31/2024   FOOT EXAM  08/09/2024   HEMOGLOBIN A1C  12/28/2024   OPHTHALMOLOGY EXAM  07/17/2025   DTaP/Tdap/Td (4 - Td or Tdap) 01/15/2030   Colonoscopy   01/29/2032   Hepatitis C Screening  Completed   Zoster Vaccines- Shingrix  Completed   Hepatitis B Vaccines  Aged Out   HPV VACCINES  Aged Out   Meningococcal B Vaccine  Aged Out   COVID-19 Vaccine  Discontinued    Health Maintenance  Health Maintenance Due  Topic Date Due   Medicare Annual Wellness (AWV)  Never done   Diabetic kidney evaluation - Urine ACR  Never done   Pneumococcal Vaccine: 50+ Years (1 of 2 - PCV) Never done   INFLUENZA VACCINE  07/26/2024   Diabetic kidney evaluation - eGFR measurement  07/31/2024   Health Maintenance Items Addressed: Labs Ordered: UACR and eGFR due, See Nurse Notes at the end of this note  Additional Screening:  Vision Screening: Recommended annual ophthalmology exams for early detection of glaucoma and other disorders of the eye. Would you like a referral to an eye doctor? No    Dental Screening: Recommended annual dental exams for proper oral hygiene  Community Resource Referral / Chronic Care Management: CRR required this visit?  No   CCM required this visit?  No   Plan:    I have personally reviewed and noted the following in the patient's chart:   Medical and social history Use of alcohol, tobacco or illicit drugs  Current medications and  supplements including opioid prescriptions. Patient is not currently taking opioid prescriptions. Functional ability and status Nutritional status Physical activity Advanced directives List of other physicians Hospitalizations, surgeries, and ER visits in previous 12 months Vitals Screenings to include cognitive, depression, and falls Referrals and appointments  In addition, I have reviewed and discussed with patient certain preventive protocols, quality metrics, and best practice recommendations. A written personalized care plan for preventive services as well as general preventive health recommendations were provided to patient.   Ethan Boyd, CMA   07/26/2024   After Visit  Summary: (MyChart) Due to this being a telephonic visit, the after visit summary with patients personalized plan was offered to patient via MyChart   Notes: Patient is due for a UACR and a eGRF, which can be done during his office visit.  He is due for a pneumonia vaccine also.  Patient is up to date on all other health maintenance with no concerns to address today.

## 2024-07-26 NOTE — Patient Instructions (Signed)
 Mr. Dearden , Thank you for taking time out of your busy schedule to complete your Annual Wellness Visit with me. I enjoyed our conversation and look forward to speaking with you again next year. I, as well as your care team,  appreciate your ongoing commitment to your health goals. Please review the following plan we discussed and let me know if I can assist you in the future. Your Game plan/ To Do List    Referrals: If you haven't heard from the office you've been referred to, please reach out to them at the phone provided.   Follow up Visits: We will see or speak with you next year for your Next Medicare AWV with our clinical staff Have you seen your provider in the last 6 months (3 months if uncontrolled diabetes)? Yes  Clinician Recommendations:  Aim for 30 minutes of exercise or brisk walking, 6-8 glasses of water, and 5 servings of fruits and vegetables each day. You are due for a kidney evaluation and can get that done during your office visit.  You ar also due for a pneumonia vaccine.        This is a list of the screenings recommended for you:  Health Maintenance  Topic Date Due   Medicare Annual Wellness Visit  Never done   Yearly kidney health urinalysis for diabetes  Never done   Pneumococcal Vaccine for age over 59 (1 of 2 - PCV) Never done   Flu Shot  07/26/2024   Yearly kidney function blood test for diabetes  07/31/2024   Complete foot exam   08/09/2024   Hemoglobin A1C  12/28/2024   Eye exam for diabetics  07/17/2025   DTaP/Tdap/Td vaccine (4 - Td or Tdap) 01/15/2030   Colon Cancer Screening  01/29/2032   Hepatitis C Screening  Completed   Zoster (Shingles) Vaccine  Completed   Hepatitis B Vaccine  Aged Out   HPV Vaccine  Aged Out   Meningitis B Vaccine  Aged Out   COVID-19 Vaccine  Discontinued    Advanced directives: (Copy Requested) Please bring a copy of your health care power of attorney and living will to the office to be added to your chart at your  convenience. You can mail to [facility] [address] [place], [place] [address] or email to [email or web address] Advance Care Planning is important because it:  [x]  Makes sure you receive the medical care that is consistent with your values, goals, and preferences  [x]  It provides guidance to your family and loved ones and reduces their decisional burden about whether or not they are making the right decisions based on your wishes.  Follow the link provided in your after visit summary or read over the paperwork we have mailed to you to help you started getting your Advance Directives in place. If you need assistance in completing these, please reach out to us  so that we can help you!  See attachments for Preventive Care and Fall Prevention Tips.

## 2024-07-29 ENCOUNTER — Ambulatory Visit: Payer: Self-pay | Admitting: Internal Medicine

## 2024-07-29 ENCOUNTER — Encounter: Payer: Self-pay | Admitting: Internal Medicine

## 2024-07-29 ENCOUNTER — Ambulatory Visit (INDEPENDENT_AMBULATORY_CARE_PROVIDER_SITE_OTHER): Admitting: Internal Medicine

## 2024-07-29 VITALS — BP 152/78 | HR 114 | Temp 98.2°F | Ht 73.0 in | Wt 173.4 lb

## 2024-07-29 DIAGNOSIS — Z125 Encounter for screening for malignant neoplasm of prostate: Secondary | ICD-10-CM

## 2024-07-29 DIAGNOSIS — E559 Vitamin D deficiency, unspecified: Secondary | ICD-10-CM | POA: Diagnosis not present

## 2024-07-29 DIAGNOSIS — E1140 Type 2 diabetes mellitus with diabetic neuropathy, unspecified: Secondary | ICD-10-CM

## 2024-07-29 DIAGNOSIS — E785 Hyperlipidemia, unspecified: Secondary | ICD-10-CM

## 2024-07-29 DIAGNOSIS — N320 Bladder-neck obstruction: Secondary | ICD-10-CM

## 2024-07-29 DIAGNOSIS — I10 Essential (primary) hypertension: Secondary | ICD-10-CM

## 2024-07-29 DIAGNOSIS — E118 Type 2 diabetes mellitus with unspecified complications: Secondary | ICD-10-CM | POA: Diagnosis not present

## 2024-07-29 DIAGNOSIS — J449 Chronic obstructive pulmonary disease, unspecified: Secondary | ICD-10-CM | POA: Diagnosis not present

## 2024-07-29 DIAGNOSIS — E538 Deficiency of other specified B group vitamins: Secondary | ICD-10-CM

## 2024-07-29 DIAGNOSIS — Z7984 Long term (current) use of oral hypoglycemic drugs: Secondary | ICD-10-CM

## 2024-07-29 LAB — URINALYSIS, ROUTINE W REFLEX MICROSCOPIC
Bilirubin Urine: NEGATIVE
Hgb urine dipstick: NEGATIVE
Ketones, ur: NEGATIVE
Leukocytes,Ua: NEGATIVE
Nitrite: NEGATIVE
RBC / HPF: NONE SEEN (ref 0–?)
Specific Gravity, Urine: <=1.005 — AB (ref 1.000–1.030)
Total Protein, Urine: NEGATIVE
Urine Glucose: >=1000 — AB
Urobilinogen, UA: 0.2 (ref 0.0–1.0)
WBC, UA: NONE SEEN (ref 0–?)
pH: 6.5 (ref 5.0–8.0)

## 2024-07-29 LAB — HEPATIC FUNCTION PANEL
ALT: 21 U/L (ref 0–53)
AST: 17 U/L (ref 0–37)
Albumin: 4.8 g/dL (ref 3.5–5.2)
Alkaline Phosphatase: 96 U/L (ref 39–117)
Bilirubin, Direct: 0.1 mg/dL (ref 0.0–0.3)
Total Bilirubin: 0.4 mg/dL (ref 0.2–1.2)
Total Protein: 7.7 g/dL (ref 6.0–8.3)

## 2024-07-29 LAB — BASIC METABOLIC PANEL WITH GFR
BUN: 19 mg/dL (ref 6–23)
CO2: 27 meq/L (ref 19–32)
Calcium: 10 mg/dL (ref 8.4–10.5)
Chloride: 100 meq/L (ref 96–112)
Creatinine, Ser: 0.97 mg/dL (ref 0.40–1.50)
GFR: 80.64 mL/min (ref >60.00)
Glucose, Bld: 201 mg/dL — ABNORMAL HIGH (ref 70–99)
Potassium: 4.8 meq/L (ref 3.5–5.1)
Sodium: 138 meq/L (ref 135–145)

## 2024-07-29 LAB — LIPID PANEL
Cholesterol: 160 mg/dL (ref 0–200)
HDL: 70.2 mg/dL (ref >39.00)
LDL Cholesterol: 77 mg/dL (ref 0–99)
NonHDL: 89.45
Total CHOL/HDL Ratio: 2
Triglycerides: 62 mg/dL (ref 0.0–149.0)
VLDL: 12.4 mg/dL (ref 0.0–40.0)

## 2024-07-29 LAB — CBC WITH DIFFERENTIAL/PLATELET
Basophils Absolute: 0.1 K/uL (ref 0.0–0.1)
Basophils Relative: 0.8 % (ref 0.0–3.0)
Eosinophils Absolute: 0.4 K/uL (ref 0.0–0.7)
Eosinophils Relative: 6 % — ABNORMAL HIGH (ref 0.0–5.0)
HCT: 44.5 % (ref 39.0–52.0)
Hemoglobin: 14.9 g/dL (ref 13.0–17.0)
Lymphocytes Relative: 24.6 % (ref 12.0–46.0)
Lymphs Abs: 1.8 K/uL (ref 0.7–4.0)
MCHC: 33.6 g/dL (ref 30.0–36.0)
MCV: 94.2 fl (ref 78.0–100.0)
Monocytes Absolute: 0.7 K/uL (ref 0.1–1.0)
Monocytes Relative: 9.7 % (ref 3.0–12.0)
Neutro Abs: 4.3 K/uL (ref 1.4–7.7)
Neutrophils Relative %: 58.9 % (ref 43.0–77.0)
Platelets: 217 K/uL (ref 150.0–400.0)
RBC: 4.72 Mil/uL (ref 4.22–5.81)
RDW: 12.3 % (ref 11.5–15.5)
WBC: 7.3 K/uL (ref 4.0–10.5)

## 2024-07-29 LAB — MICROALBUMIN / CREATININE URINE RATIO
Creatinine,U: 28.2 mg/dL
Microalb Creat Ratio: 293.2 mg/g — ABNORMAL HIGH (ref 0.0–30.0)
Microalb, Ur: 8.3 mg/dL — ABNORMAL HIGH (ref 0.0–1.9)

## 2024-07-29 LAB — TSH: TSH: 1.14 u[IU]/mL (ref 0.35–5.50)

## 2024-07-29 LAB — PSA: PSA: 0.69 ng/mL (ref 0.10–4.00)

## 2024-07-29 LAB — VITAMIN B12: Vitamin B-12: 1388 pg/mL — ABNORMAL HIGH (ref 211–911)

## 2024-07-29 LAB — VITAMIN D 25 HYDROXY (VIT D DEFICIENCY, FRACTURES): VITD: 52.72 ng/mL (ref 30.00–100.00)

## 2024-07-29 MED ORDER — AMLODIPINE BESYLATE 5 MG PO TABS: 5.0000 mg | ORAL_TABLET | Freq: Every day | ORAL | 3 refills | Status: AC

## 2024-07-29 NOTE — Assessment & Plan Note (Signed)
 Last vitamin D  Lab Results  Component Value Date   VD25OH 52.72 07/29/2024   Stable, cont oral replacement

## 2024-07-29 NOTE — Progress Notes (Signed)
 Patient ID: Ethan Boyd, male   DOB: 12/12/1956, 68 y.o.   MRN: [id number]         Chief Complaint:: yearly exam       HPI:  Ethan Boyd is a 68 y.o. male here overall doing ok, Pt denies chest pain, increased sob or doe, wheezing, orthopnea, PND, increased LE swelling, palpitations, dizziness or syncope.   Pt denies polydipsia, polyuria, or new focal neuro s/s.    Pt denies fever, wt loss, night sweats, loss of appetite, or other constitutional symptoms  Sees endo every 3 months, recent A1c 7.1.  No bleeding.  BP has been controlled at home. Due for prevnar 20 but prefers at the pharmacy   Wt Readings from Last 3 Encounters:  07/29/24 173 lb 6.4 oz (78.7 kg)  07/26/24 173 lb (78.5 kg)  06/27/24 173 lb (78.5 kg)   BP Readings from Last 3 Encounters:  07/29/24 (!) 152/78  06/27/24 (!) 142/74  11/15/23 120/70   Immunization History  Administered Date(s) Administered   Influenza Split 09/07/2012   Influenza Whole 09/25/2008   Influenza,inj,Quad PF,6+ Mos 11/09/2018, 09/17/2019   Influenza-Unspecified 09/25/2017   PFIZER(Purple Top)SARS-COV-2 Vaccination 03/30/2020, 04/21/2020   Td 02/18/2009   Tdap 01/15/2020, 01/16/2020   Zoster Recombinant(Shingrix) 10/01/2019, 12/23/2019   Health Maintenance Due  Topic Date Due   Pneumococcal Vaccine: 50+ Years (1 of 2 - PCV) Never done   INFLUENZA VACCINE  07/26/2024      Past Medical History:  Diagnosis Date   DIABETES MELLITUS, TYPE II 02/18/2009   Qualifier: Diagnosis of  By: Norleen MD, Lynwood ORN    Diverticulosis 08/31/2012   Colonoscopy, June 10, 2009, Dr Perry/GI   ERECTILE DYSFUNCTION 02/18/2009   Qualifier: Diagnosis of  By: Norleen MD, Lynwood ORN    Erectile dysfunction 09/07/2012   HYPERLIPIDEMIA 02/18/2009   Qualifier: Diagnosis of  By: Norleen MD, Lynwood ORN    HYPERTENSION 02/18/2009   Qualifier: Diagnosis of  By: Norleen MD, Lynwood ORN    Past Surgical History:  Procedure Laterality Date   COLONOSCOPY  06/10/2009   right knee cartilage   12/26/1974    reports that he has never smoked. He has never used smokeless tobacco. He reports current alcohol use of about 8.0 standard drinks of alcohol per week. He reports that he does not use drugs. family history includes Diabetes in an other family member. Allergies  Allergen Reactions   Aspirin     REACTION: angioedema   Current Outpatient Medications on File Prior to Visit  Medication Sig Dispense Refill   Blood Glucose Monitoring Suppl DEVI 1 each by Does not apply route 3 (three) times daily. May dispense any manufacturer covered by patient's insurance. 1 each 0   calcium  citrate (CALCITRATE - DOSED IN MG ELEMENTAL CALCIUM ) 950 (200 Ca) MG tablet Take 200 mg of elemental calcium  by mouth daily.     clopidogrel  (PLAVIX ) 75 MG tablet Take 1 tablet (75 mg total) by mouth daily. 90 tablet 3   dapagliflozin  propanediol (FARXIGA ) 10 MG TABS tablet Take 1 tablet (10 mg total) by mouth daily before breakfast. 90 tablet 1   Glucose Blood (BLOOD GLUCOSE TEST STRIPS) STRP 1 each by Does not apply route 3 (three) times daily. Use as directed to check blood sugar. May dispense any manufacturer covered by patient's insurance and fits patient's device. 300 strip 3   insulin  glargine (LANTUS ) 100 UNIT/ML Solostar Pen Inject 8 Units into the skin at bedtime. 15 mL 0  Insulin  Pen Needle (PEN NEEDLES) 31G X 5 MM MISC 1 each by Does not apply route 3 (three) times daily. May dispense any manufacturer covered by patient's insurance. 100 each 0   Lancet Device MISC 1 each by Does not apply route 3 (three) times daily. May dispense any manufacturer covered by patient's insurance. 1 each 0   Lancets MISC 1 each by Does not apply route 3 (three) times daily. Use as directed to check blood sugar. May dispense any manufacturer covered by patient's insurance and fits patient's device. 100 each 0   lisinopril  (ZESTRIL ) 2.5 MG tablet Take 1 tablet (2.5 mg total) by mouth daily. 90 tablet 1   metFORMIN   (GLUCOPHAGE -XR) 750 MG 24 hr tablet Take 2 tablets (1,500 mg total) by mouth daily with breakfast. 180 tablet 3   Multiple Vitamin (MULTIVITAMIN WITH MINERALS) TABS tablet Take 1 tablet by mouth daily.     Omega-3 Fatty Acids (FISH OIL) 1200 MG CAPS Take by mouth.     rosuvastatin  (CRESTOR ) 40 MG tablet Take 1 tablet (40 mg total) by mouth daily. 90 tablet 3   Study - OCEANIC-STROKE - asundexian 50 mg or placebo tablet (PI-Sethi) Take 1 tablet (50 mg total) by mouth daily. For Investigational Use Only. Take at the same time each day (preferably in the morning). Tablet should be swallowed whole with water; it CANNOT be crushed or broken. Please contact [facility] for any questions or concerns regarding this medication. 196 tablet 0   terbinafine  (LAMISIL ) 250 MG tablet Take 1 tablet (250 mg total) by mouth daily. 90 tablet 0   Vitamin D , Cholecalciferol, 25 MCG (1000 UT) TABS Take 1,000 Units by mouth daily at 8 pm.     No current facility-administered medications on file prior to visit.        ROS:  All others reviewed and negative.  Objective        PE:  BP (!) 152/78   Pulse (!) 114   Temp 98.2 F (36.8 C)   Ht 6' 1 (1.854 m)   Wt 173 lb 6.4 oz (78.7 kg)   SpO2 99%   BMI 22.88 kg/m                 Constitutional: Pt appears in NAD               HENT: Head: NCAT.                Right Ear: External ear normal.                 Left Ear: External ear normal.                Eyes: . Pupils are equal, round, and reactive to light. Conjunctivae and EOM are normal               Nose: without d/c or deformity               Neck: Neck supple. Gross normal ROM               Cardiovascular: Normal rate and regular rhythm.                 Pulmonary/Chest: Effort normal and breath sounds without rales or wheezing.                Abd:  Soft, NT, ND, + BS, no organomegaly  Neurological: Pt is alert. At baseline orientation, motor grossly intact               Skin:  Skin is warm. No rashes, no other new lesions, LE edema - none               Psychiatric: Pt behavior is normal without agitation   Micro: none  Cardiac tracings I have personally interpreted today:  none  Pertinent Radiological findings (summarize): none   Lab Results  Component Value Date   WBC 7.3 07/29/2024   HGB 14.9 07/29/2024   HCT 44.5 07/29/2024   PLT 217.0 07/29/2024   GLUCOSE 201 (H) 07/29/2024   CHOL 160 07/29/2024   TRIG 62.0 07/29/2024   HDL 70.20 07/29/2024   LDLDIRECT 136.4 09/10/2012   LDLCALC 77 07/29/2024   ALT 21 07/29/2024   AST 17 07/29/2024   NA 138 07/29/2024   K 4.8 07/29/2024   CL 100 07/29/2024   CREATININE 0.97 07/29/2024   BUN 19 07/29/2024   CO2 27 07/29/2024   TSH 1.14 07/29/2024   PSA 0.69 07/29/2024   INR 1.0 03/19/2023   HGBA1C 7.1 (A) 06/27/2024   MICROALBUR 8.3 (H) 07/29/2024   Assessment/Plan:  Ethan Boyd is a 68 y.o. White or Caucasian [1] male with  has a past medical history of DIABETES MELLITUS, TYPE II (02/18/2009), Diverticulosis (08/31/2012), ERECTILE DYSFUNCTION (02/18/2009), Erectile dysfunction (09/07/2012), HYPERLIPIDEMIA (02/18/2009), and HYPERTENSION (02/18/2009).  COPD (chronic obstructive pulmonary disease) (HCC) Stable overall, pt to continue inhaler prn  Vitamin D  deficiency Last vitamin D  Lab Results  Component Value Date   VD25OH 52.72 07/29/2024   Stable, cont oral replacement   Type II diabetes mellitus with manifestations (HCC) Lab Results  Component Value Date   HGBA1C 7.1 (A) 06/27/2024   uncontrolled, pt to continue current medical treatment farxiga  10 mg qd, declines other change today   Hyperlipidemia LDL goal <70 Lab Results  Component Value Date   LDLCALC 77 07/29/2024   uncontrolled, pt to continue current statin crestor  40 mg every day, declines other change for now  Essential hypertension BP Readings from Last 3 Encounters:  07/29/24 (!) 152/78  06/27/24 (!) 142/74  11/15/23 120/70    Uncontrolled, pt to continue medical treatment lisinopril  2.5 mg every day, norvasc  5 every day, declines other change today  Followup: Return in about 1 year (around 07/29/2025).  Lynwood Rush, MD 07/29/2024 7:32 PM [facility]

## 2024-07-29 NOTE — Patient Instructions (Addendum)
 Please have your Prevnar 20 done at the pharmacy per your preference.  Please continue all other medications as before, and refills have been done if requested.  Please have the pharmacy call with any other refills you may need.  Please continue your efforts at being more active, low cholesterol diet, and weight control.  You are otherwise up to date with prevention measures today.  Please keep your appointments with your specialists as you may have planned - endocrinology every 3 months  Please go to the LAB at the blood drawing area for the tests to be done  You will be contacted by phone if any changes need to be made immediately.  Otherwise, you will receive a letter about your results with an explanation, but please check with MyChart first.  Please make an Appointment to return for your 1 year visit, or sooner if needed

## 2024-07-29 NOTE — Assessment & Plan Note (Signed)
 Lab Results  Component Value Date   LDLCALC 77 07/29/2024   uncontrolled, pt to continue current statin crestor  40 mg every day, declines other change for now

## 2024-07-29 NOTE — Assessment & Plan Note (Signed)
Stable overall, pt to continue inhaler prn

## 2024-07-29 NOTE — Assessment & Plan Note (Signed)
 Lab Results  Component Value Date   HGBA1C 7.1 (A) 06/27/2024   uncontrolled, pt to continue current medical treatment farxiga  10 mg qd, declines other change today

## 2024-07-29 NOTE — Progress Notes (Signed)
 The test results show that your current treatment is OK, as the tests are stable..  The microalbumin is still elevated, so remember to follow up with endocrinology as you have planned    Please continue the same plan.  There is no other need for change of treatment or further evaluation based on these results, at this time.  thanks

## 2024-07-29 NOTE — Assessment & Plan Note (Signed)
 BP Readings from Last 3 Encounters:  07/29/24 (!) 152/78  06/27/24 (!) 142/74  11/15/23 120/70   Uncontrolled, pt to continue medical treatment lisinopril  2.5 mg every day, norvasc  5 every day, declines other change today

## 2024-09-10 ENCOUNTER — Other Ambulatory Visit: Payer: Self-pay

## 2024-09-10 DIAGNOSIS — E1140 Type 2 diabetes mellitus with diabetic neuropathy, unspecified: Secondary | ICD-10-CM

## 2024-09-10 MED ORDER — DAPAGLIFLOZIN PROPANEDIOL 10 MG PO TABS: 10.0000 mg | ORAL_TABLET | Freq: Every day | ORAL | 1 refills | Status: AC

## 2024-10-01 ENCOUNTER — Ambulatory Visit: Admitting: "Endocrinology

## 2024-10-22 ENCOUNTER — Ambulatory Visit: Admitting: "Endocrinology

## 2024-12-11 ENCOUNTER — Encounter: Payer: Self-pay | Admitting: "Endocrinology

## 2024-12-11 ENCOUNTER — Ambulatory Visit: Admitting: "Endocrinology

## 2024-12-11 VITALS — BP 130/80 | HR 108 | Ht 73.0 in | Wt 174.0 lb

## 2024-12-11 DIAGNOSIS — Z7984 Long term (current) use of oral hypoglycemic drugs: Secondary | ICD-10-CM | POA: Diagnosis not present

## 2024-12-11 DIAGNOSIS — E7800 Pure hypercholesterolemia, unspecified: Secondary | ICD-10-CM

## 2024-12-11 DIAGNOSIS — E1140 Type 2 diabetes mellitus with diabetic neuropathy, unspecified: Secondary | ICD-10-CM

## 2024-12-11 LAB — POCT GLYCOSYLATED HEMOGLOBIN (HGB A1C): Hemoglobin A1C: 7.1 % — AB (ref 4.0–5.6)

## 2024-12-11 MED ORDER — LISINOPRIL 2.5 MG PO TABS: 2.5000 mg | ORAL_TABLET | Freq: Every day | ORAL | 1 refills | Status: AC

## 2024-12-11 NOTE — Progress Notes (Addendum)
 Outpatient Endocrinology Note Ethan Birmingham, MD  12/11/2024   Ethan Boyd [birth year] [id number]  Referring Provider: Norleen Boyd ORN, MD Primary Care Provider: Norleen Boyd ORN, MD Reason for consultation: Subjective   Assessment & Plan  Diagnoses and all orders for this visit:  Type 2 diabetes mellitus with diabetic neuropathy, without long-term current use of insulin  (HCC) -     POCT glycosylated hemoglobin (Hb A1C) -     lisinopril  (ZESTRIL ) 2.5 MG tablet; Take 1 tablet (2.5 mg total) by mouth daily.  Long term (current) use of oral hypoglycemic drugs  Pure hypercholesterolemia    Diabetes Type II complicated by neuropathy, stroke  Lab Results  Component Value Date   GFR 80.64 07/29/2024   Hba1c goal less than 7, current Hba1c is  Lab Results  Component Value Date   HGBA1C 7.1 (A) 12/11/2024   Will recommend the following: Farxiga  10 mg every day Metformin  XR 750 mg 2 tabs a day  Doesn't want CGM No known contraindications/side effects to any of above medications  -Last LD and Tg are as follows: Lab Results  Component Value Date   LDLCALC 77 07/29/2024    Lab Results  Component Value Date   TRIG 62.0 07/29/2024   -On rosuvastatin  40 mg QD -Follow low fat diet and exercise   -Blood pressure goal <140/90 - Microalbumin/creatinine goal is < 30 -Last MA/Cr is as follows: Lab Results  Component Value Date   MICROALBUR 8.3 (H) 07/29/2024    -not on ACE/ARB: resume lisinopril  2.5 mg every day due to MA/Cr + -diet changes including salt restriction -limit eating outside -counseled BP targets per standards of diabetes care -uncontrolled blood pressure can lead to retinopathy, nephropathy and cardiovascular and atherosclerotic heart disease  Reviewed and counseled on: -A1C target -Blood sugar targets -Complications of uncontrolled diabetes  -Checking blood sugar before meals and bedtime and bring log next visit -All medications with mechanism of  action and side effects -Hypoglycemia management: rule of 15's, Glucagon Emergency Kit and medical alert ID -low-carb low-fat plate-method diet -At least 20 minutes of physical activity per day -Annual dilated retinal eye exam and foot exam -compliance and follow up needs -follow up as scheduled or earlier if problem gets worse  Call if blood sugar is less than 70 or consistently above 250    Take a 15 gm snack of carbohydrate at bedtime before you go to sleep if your blood sugar is less than 100.    If you are going to fast after midnight for a test or procedure, ask your physician for instructions on how to reduce/decrease your insulin  dose.    Call if blood sugar is less than 70 or consistently above 250  -Treating a low sugar by rule of 15  (15 gms of sugar every 15 min until sugar is more than 70) If you feel your sugar is low, test your sugar to be sure If your sugar is low (less than 70), then take 15 grams of a fast acting Carbohydrate (3-4 glucose tablets or glucose gel or 4 ounces of juice or regular soda) Recheck your sugar 15 min after treating low to make sure it is more than 70 If sugar is still less than 70, treat again with 15 grams of carbohydrate          Don't drive the hour of hypoglycemia  If unconscious/unable to eat or drink by mouth, use glucagon injection or nasal spray baqsimi and call 911. Can repeat  again in 15 min if still unconscious.  Return in about 6 months (around 06/11/2025).   I have reviewed current medications, nurse's notes, allergies, vital signs, past medical and surgical history, family medical history, and social history for this encounter. Counseled patient on symptoms, examination findings, lab findings, imaging results, treatment decisions and monitoring and prognosis. The patient understood the recommendations and agrees with the treatment plan. All questions regarding treatment plan were fully answered.  Ethan Birmingham, MD   12/11/2024  History of Present Illness Ethan Boyd is a 67 y.o. year old male who presents for follow up of Type II diabetes mellitus.  Ethan Boyd was first diagnosed around 2009.   Diabetes education +  Home diabetes regimen: Farxiga  10 mg every day Metformin  XR 750 mg 2 tabs a day  Stopped Lantus  8 units every day   COMPLICATIONS -  MI/+Stroke -  retinopathy +  neuropathy +  nephropathy  BLOOD SUGAR DATA Checks different times every day, 0-1 times a day 129-200 per log    Physical Exam  BP 130/80   Pulse (!) 108   Ht 6' 1 (1.854 m)   Wt 174 lb (78.9 kg)   SpO2 97%   BMI 22.96 kg/m    Constitutional: well developed, well nourished Head: normocephalic, atraumatic Eyes: sclera anicteric, no redness Neck: supple Lungs: normal respiratory effort Neurology: alert and oriented Skin: dry, no appreciable rashes Musculoskeletal: no appreciable defects Psychiatric: normal mood and affect Diabetic Foot Exam - Simple   No data filed      Current Medications Patient's Medications  New Prescriptions   No medications on file  Previous Medications   AMLODIPINE  (NORVASC ) 5 MG TABLET    Take 1 tablet (5 mg total) by mouth daily.   BLOOD GLUCOSE MONITORING SUPPL DEVI    1 each by Does not apply route 3 (three) times daily. May dispense any manufacturer covered by patient's insurance.   CALCIUM  CITRATE (CALCITRATE - DOSED IN MG ELEMENTAL CALCIUM ) 950 (200 CA) MG TABLET    Take 200 mg of elemental calcium  by mouth daily.   CLOPIDOGREL  (PLAVIX ) 75 MG TABLET    Take 1 tablet (75 mg total) by mouth daily.   DAPAGLIFLOZIN  PROPANEDIOL (FARXIGA ) 10 MG TABS TABLET    Take 1 tablet (10 mg total) by mouth daily before breakfast.   GLUCOSE BLOOD (BLOOD GLUCOSE TEST STRIPS) STRP    1 each by Does not apply route 3 (three) times daily. Use as directed to check blood sugar. May dispense any manufacturer covered by patient's insurance and fits patient's device.   INSULIN  GLARGINE  (LANTUS ) 100 UNIT/ML SOLOSTAR PEN    Inject 8 Units into the skin at bedtime.   INSULIN  PEN NEEDLE (PEN NEEDLES) 31G X 5 MM MISC    1 each by Does not apply route 3 (three) times daily. May dispense any manufacturer covered by patient's insurance.   LANCET DEVICE MISC    1 each by Does not apply route 3 (three) times daily. May dispense any manufacturer covered by patient's insurance.   LANCETS MISC    1 each by Does not apply route 3 (three) times daily. Use as directed to check blood sugar. May dispense any manufacturer covered by patient's insurance and fits patient's device.   METFORMIN  (GLUCOPHAGE -XR) 750 MG 24 HR TABLET    Take 2 tablets (1,500 mg total) by mouth daily with breakfast.   MULTIPLE VITAMIN (MULTIVITAMIN WITH MINERALS) TABS TABLET    Take 1 tablet by  mouth daily.   OMEGA-3 FATTY ACIDS (FISH OIL) 1200 MG CAPS    Take by mouth.   ROSUVASTATIN  (CRESTOR ) 40 MG TABLET    Take 1 tablet (40 mg total) by mouth daily.   STUDY - OCEANIC-STROKE - ASUNDEXIAN 50 MG OR PLACEBO TABLET (PI-SETHI)    Take 1 tablet (50 mg total) by mouth daily. For Investigational Use Only. Take at the same time each day (preferably in the morning). Tablet should be swallowed whole with water; it CANNOT be crushed or broken. Please contact [facility] for any questions or concerns regarding this medication.   TERBINAFINE  (LAMISIL ) 250 MG TABLET    Take 1 tablet (250 mg total) by mouth daily.   VITAMIN D , CHOLECALCIFEROL, 25 MCG (1000 UT) TABS    Take 1,000 Units by mouth daily at 8 pm.  Modified Medications   Modified Medication Previous Medication   LISINOPRIL  (ZESTRIL ) 2.5 MG TABLET lisinopril  (ZESTRIL ) 2.5 MG tablet      Take 1 tablet (2.5 mg total) by mouth daily.    Take 1 tablet (2.5 mg total) by mouth daily.  Discontinued Medications   No medications on file    Allergies Allergies  Allergen Reactions   Aspirin     REACTION: angioedema    Past Medical History Past Medical History:   Diagnosis Date   DIABETES MELLITUS, TYPE II 02/18/2009   Qualifier: Diagnosis of  By: Norleen MD, Boyd ORN    Diverticulosis 08/31/2012   Colonoscopy, June 10, 2009, Dr Perry/GI   ERECTILE DYSFUNCTION 02/18/2009   Qualifier: Diagnosis of  By: Norleen MD, Boyd ORN    Erectile dysfunction 09/07/2012   HYPERLIPIDEMIA 02/18/2009   Qualifier: Diagnosis of  By: Norleen MD, Boyd ORN    HYPERTENSION 02/18/2009   Qualifier: Diagnosis of  By: Norleen MD, Boyd ORN     Past Surgical History Past Surgical History:  Procedure Laterality Date   COLONOSCOPY  06/10/2009   right knee cartilage  12/26/1974    Family History family history includes Diabetes in an other family member.  Social History Social History   Socioeconomic History   Marital status: Married    Spouse name: [name]   Number of children: 2   Years of education: Not on file   Highest education level: Bachelor's degree (e.g., BA, AB, BS)  Occupational History   Occupation: RETIRED  Tobacco Use   Smoking status: Never   Smokeless tobacco: Never  Vaping Use   Vaping status: Never Used  Substance and Sexual Activity   Alcohol use: Yes    Alcohol/week: 8.0 standard drinks of alcohol    Types: 8 Cans of beer per week   Drug use: No   Sexual activity: Yes    Partners: Female  Other Topics Concern   Not on file  Social History Narrative   Lives with wife/2025   Social Drivers of Health   Tobacco Use: Low Risk (12/11/2024)   Patient History    Smoking Tobacco Use: Never    Smokeless Tobacco Use: Never    Passive Exposure: Not on file  Financial Resource Strain: Low Risk (07/26/2024)   Overall Financial Resource Strain (CARDIA)    Difficulty of Paying Living Expenses: Not hard at all  Food Insecurity: No Food Insecurity (07/26/2024)   Epic    Worried About Radiation Protection Practitioner of Food in the Last Year: Never true    Ran Out of Food in the Last Year: Never true  Transportation Needs: No Transportation Needs (07/26/2024)  Epic    Lack of  Transportation (Medical): No    Lack of Transportation (Non-Medical): No  Physical Activity: Insufficiently Active (07/26/2024)   Exercise Vital Sign    Days of Exercise per Week: 3 days    Minutes of Exercise per Session: 30 min  Stress: No Stress Concern Present (07/26/2024)   [employer] of Occupational Health - Occupational Stress Questionnaire    Feeling of Stress: Not at all  Social Connections: Socially Integrated (07/26/2024)   Social Connection and Isolation Panel    Frequency of Communication with Friends and Family: Twice a week    Frequency of Social Gatherings with Friends and Family: Once a week    Attends Religious Services: More than 4 times per year    Active Member of [employer] or Organizations: Yes    Attends Banker Meetings: Never    Marital Status: Married  Catering Manager Violence: Not At Risk (07/26/2024)   Epic    Fear of Current or Ex-Partner: No    Emotionally Abused: No    Physically Abused: No    Sexually Abused: No  Depression (PHQ2-9): Low Risk (07/26/2024)   Depression (PHQ2-9)    PHQ-2 Score: 0  Alcohol Screen: Low Risk (07/26/2024)   Alcohol Screen    Last Alcohol Screening Score (AUDIT): 0  Housing: Unknown (07/26/2024)   Epic    Unable to Pay for Housing in the Last Year: No    Number of Times Moved in the Last Year: Not on file    Homeless in the Last Year: No  Utilities: Not At Risk (07/26/2024)   Epic    Threatened with loss of utilities: No  Health Literacy: Adequate Health Literacy (07/26/2024)   B1300 Health Literacy    Frequency of need for help with medical instructions: Never    Lab Results  Component Value Date   HGBA1C 7.1 (A) 12/11/2024   HGBA1C 7.1 (A) 06/27/2024   HGBA1C 6.8 (A) 11/15/2023   Lab Results  Component Value Date   CHOL 160 07/29/2024   Lab Results  Component Value Date   HDL 70.20 07/29/2024   Lab Results  Component Value Date   LDLCALC 77 07/29/2024   Lab Results  Component Value Date   TRIG  62.0 07/29/2024   Lab Results  Component Value Date   CHOLHDL 2 07/29/2024   Lab Results  Component Value Date   CREATININE 0.97 07/29/2024   Lab Results  Component Value Date   GFR 80.64 07/29/2024   Lab Results  Component Value Date   MICROALBUR 8.3 (H) 07/29/2024       Component Value Date/Time   NA 138 07/29/2024 1351   K 4.8 07/29/2024 1351   CL 100 07/29/2024 1351   CO2 27 07/29/2024 1351   GLUCOSE 201 (H) 07/29/2024 1351   BUN 19 07/29/2024 1351   CREATININE 0.97 07/29/2024 1351   CALCIUM  10.0 07/29/2024 1351   PROT 7.7 07/29/2024 1351   ALBUMIN 4.8 07/29/2024 1351   AST 17 07/29/2024 1351   ALT 21 07/29/2024 1351   ALKPHOS 96 07/29/2024 1351   BILITOT 0.4 07/29/2024 1351   GFRNONAA >60 03/19/2023 1930   GFRAA 131 02/11/2009 1003      Latest Ref Rng & Units 07/29/2024    1:51 PM 08/01/2023   11:05 AM 06/27/2023    1:54 PM  BMP  Glucose 70 - 99 mg/dL 798  716  844   BUN 6 - 23 mg/dL 19  14  17   Creatinine 0.40 - 1.50 mg/dL 9.02  9.08  9.06   Sodium 135 - 145 mEq/L 138  136  140   Potassium 3.5 - 5.1 mEq/L 4.8  4.2  4.4   Chloride 96 - 112 mEq/L 100  98  99   CO2 19 - 32 mEq/L 27  25  30    Calcium  8.4 - 10.5 mg/dL 89.9  9.2  89.5        Component Value Date/Time   WBC 7.3 07/29/2024 1351   RBC 4.72 07/29/2024 1351   HGB 14.9 07/29/2024 1351   HCT 44.5 07/29/2024 1351   PLT 217.0 07/29/2024 1351   MCV 94.2 07/29/2024 1351   MCH 32.4 03/19/2023 1930   MCHC 33.6 07/29/2024 1351   RDW 12.3 07/29/2024 1351   LYMPHSABS 1.8 07/29/2024 1351   MONOABS 0.7 07/29/2024 1351   EOSABS 0.4 07/29/2024 1351   BASOSABS 0.1 07/29/2024 1351     Parts of this note may have been dictated using voice recognition software. There may be variances in spelling and vocabulary which are unintentional. Not all errors are proofread. Please notify the dino if any discrepancies are noted or if the meaning of any statement is not clear.

## 2025-02-12 ENCOUNTER — Ambulatory Visit: Admitting: Podiatry

## 2025-06-11 ENCOUNTER — Ambulatory Visit: Admitting: "Endocrinology

## 2025-07-30 ENCOUNTER — Ambulatory Visit: Admitting: Internal Medicine
